# Patient Record
Sex: Female | Born: 1975 | Race: White | Hispanic: No | Smoking: Never smoker
Health system: Southern US, Community
[De-identification: ages and names within clinical notes are randomized; demographics above are authoritative.]

## PROBLEM LIST (undated history)

## (undated) DIAGNOSIS — E119 Type 2 diabetes mellitus without complications: Secondary | ICD-10-CM

## (undated) DIAGNOSIS — F429 Obsessive-compulsive disorder, unspecified: Secondary | ICD-10-CM

## (undated) DIAGNOSIS — Q8901 Asplenia (congenital): Secondary | ICD-10-CM

## (undated) DIAGNOSIS — F329 Major depressive disorder, single episode, unspecified: Secondary | ICD-10-CM

## (undated) DIAGNOSIS — G629 Polyneuropathy, unspecified: Secondary | ICD-10-CM

## (undated) DIAGNOSIS — I671 Cerebral aneurysm, nonruptured: Secondary | ICD-10-CM

## (undated) DIAGNOSIS — F32A Depression, unspecified: Secondary | ICD-10-CM

## (undated) DIAGNOSIS — F4320 Adjustment disorder, unspecified: Secondary | ICD-10-CM

## (undated) DIAGNOSIS — R7309 Other abnormal glucose: Secondary | ICD-10-CM

## (undated) DIAGNOSIS — E756 Lipid storage disorder, unspecified: Secondary | ICD-10-CM

## (undated) DIAGNOSIS — I10 Essential (primary) hypertension: Secondary | ICD-10-CM

## (undated) HISTORY — PX: SPLENECTOMY: SUR1306

---

## 2008-01-09 ENCOUNTER — Encounter (INDEPENDENT_AMBULATORY_CARE_PROVIDER_SITE_OTHER): Payer: Self-pay | Admitting: Oncology

## 2008-01-09 ENCOUNTER — Other Ambulatory Visit: Admission: RE | Admit: 2008-01-09 | Discharge: 2008-01-09 | Payer: Self-pay | Admitting: Oncology

## 2009-10-02 ENCOUNTER — Ambulatory Visit: Payer: Self-pay | Admitting: Diagnostic Radiology

## 2009-10-02 ENCOUNTER — Emergency Department (HOSPITAL_BASED_OUTPATIENT_CLINIC_OR_DEPARTMENT_OTHER): Admission: EM | Admit: 2009-10-02 | Discharge: 2009-10-02 | Payer: Self-pay | Admitting: Emergency Medicine

## 2010-03-22 ENCOUNTER — Encounter (INDEPENDENT_AMBULATORY_CARE_PROVIDER_SITE_OTHER): Payer: Self-pay | Admitting: *Deleted

## 2010-03-29 NOTE — Letter (Signed)
Summary: New Patient letter  [facility]  [address] [place], [place] [address]   Phone: [phone]  Fax: [phone]       03/22/2010 MRN: [id number]  Megan Weber [address] [place], [place]  [address]  Dear Megan Weber,  Welcome to the Gastroenterology Division at [facility].    You are scheduled to see Dr. Maxie Better on March 31, 2010 at 8:30am on the 3rd floor at [facility], [address]  We ask that you try to arrive at our office 15 minutes prior to your appointment time to allow for check-in.  We would like you to complete the enclosed self-administered evaluation form prior to your visit and bring it with you on the day of your appointment.  We will review it with you.  Also, please bring a complete list of all your medications or, if you prefer, bring the medication bottles and we will list them.  Please bring your insurance card so that we may make a copy of it.  If your insurance requires a referral to see a specialist, please bring your referral form from your primary care physician.  Co-payments are due at the time of your visit and may be paid by cash, check or credit card.     Your office visit will consist of a consult with your physician (includes a physical exam), any laboratory testing he/she may order, scheduling of any necessary diagnostic testing (e.g. x-ray, ultrasound, CT-scan), and scheduling of a procedure (e.g. Endoscopy, Colonoscopy) if required.  Please allow enough time on your schedule to allow for any/all of these possibilities.    If you cannot keep your appointment, please call [phone] to cancel or reschedule prior to your appointment date.  This allows [place] the opportunity to schedule an appointment for another patient in need of care.  If you do not cancel or reschedule by 5 p.m. the business day prior to your appointment date, you will be charged a $50.00 late cancellation/no-show fee.    Thank you for  choosing [facility] for your medical needs.  We appreciate the opportunity to care for you.  Please visit [place] at our website  to learn more about our practice.                     Sincerely,                                                             The Gastroenterology Division

## 2010-03-30 DIAGNOSIS — F410 Panic disorder [episodic paroxysmal anxiety] without agoraphobia: Secondary | ICD-10-CM | POA: Insufficient documentation

## 2010-03-30 DIAGNOSIS — F3289 Other specified depressive episodes: Secondary | ICD-10-CM | POA: Insufficient documentation

## 2010-03-30 DIAGNOSIS — R1012 Left upper quadrant pain: Secondary | ICD-10-CM | POA: Insufficient documentation

## 2010-03-30 DIAGNOSIS — D509 Iron deficiency anemia, unspecified: Secondary | ICD-10-CM | POA: Insufficient documentation

## 2010-03-30 DIAGNOSIS — I10 Essential (primary) hypertension: Secondary | ICD-10-CM | POA: Insufficient documentation

## 2010-03-30 DIAGNOSIS — Q8909 Congenital malformations of spleen: Secondary | ICD-10-CM | POA: Insufficient documentation

## 2010-03-30 DIAGNOSIS — D72829 Elevated white blood cell count, unspecified: Secondary | ICD-10-CM | POA: Insufficient documentation

## 2010-03-30 DIAGNOSIS — F429 Obsessive-compulsive disorder, unspecified: Secondary | ICD-10-CM | POA: Insufficient documentation

## 2010-03-30 DIAGNOSIS — G4700 Insomnia, unspecified: Secondary | ICD-10-CM | POA: Insufficient documentation

## 2010-03-30 DIAGNOSIS — G4489 Other headache syndrome: Secondary | ICD-10-CM | POA: Insufficient documentation

## 2010-03-30 DIAGNOSIS — F4320 Adjustment disorder, unspecified: Secondary | ICD-10-CM | POA: Insufficient documentation

## 2010-03-30 DIAGNOSIS — F329 Major depressive disorder, single episode, unspecified: Secondary | ICD-10-CM | POA: Insufficient documentation

## 2010-03-30 DIAGNOSIS — J069 Acute upper respiratory infection, unspecified: Secondary | ICD-10-CM | POA: Insufficient documentation

## 2010-03-31 ENCOUNTER — Ambulatory Visit: Payer: Self-pay | Admitting: Gastroenterology

## 2012-06-21 ENCOUNTER — Emergency Department (HOSPITAL_BASED_OUTPATIENT_CLINIC_OR_DEPARTMENT_OTHER): Payer: PRIVATE HEALTH INSURANCE

## 2012-06-21 ENCOUNTER — Emergency Department (HOSPITAL_BASED_OUTPATIENT_CLINIC_OR_DEPARTMENT_OTHER)
Admission: EM | Admit: 2012-06-21 | Discharge: 2012-06-21 | Disposition: A | Discharge status: Home / Self Care | Payer: PRIVATE HEALTH INSURANCE | Attending: Emergency Medicine | Admitting: Emergency Medicine

## 2012-06-21 ENCOUNTER — Encounter (HOSPITAL_BASED_OUTPATIENT_CLINIC_OR_DEPARTMENT_OTHER): Payer: Self-pay | Admitting: *Deleted

## 2012-06-21 DIAGNOSIS — I10 Essential (primary) hypertension: Secondary | ICD-10-CM | POA: Insufficient documentation

## 2012-06-21 DIAGNOSIS — R109 Unspecified abdominal pain: Secondary | ICD-10-CM

## 2012-06-21 DIAGNOSIS — D72829 Elevated white blood cell count, unspecified: Secondary | ICD-10-CM | POA: Insufficient documentation

## 2012-06-21 DIAGNOSIS — R1012 Left upper quadrant pain: Secondary | ICD-10-CM | POA: Insufficient documentation

## 2012-06-21 DIAGNOSIS — Z8669 Personal history of other diseases of the nervous system and sense organs: Secondary | ICD-10-CM | POA: Insufficient documentation

## 2012-06-21 DIAGNOSIS — Z9089 Acquired absence of other organs: Secondary | ICD-10-CM | POA: Insufficient documentation

## 2012-06-21 DIAGNOSIS — Z3202 Encounter for pregnancy test, result negative: Secondary | ICD-10-CM | POA: Insufficient documentation

## 2012-06-21 DIAGNOSIS — Z79899 Other long term (current) drug therapy: Secondary | ICD-10-CM | POA: Insufficient documentation

## 2012-06-21 DIAGNOSIS — Z7982 Long term (current) use of aspirin: Secondary | ICD-10-CM | POA: Insufficient documentation

## 2012-06-21 DIAGNOSIS — Z862 Personal history of diseases of the blood and blood-forming organs and certain disorders involving the immune mechanism: Secondary | ICD-10-CM | POA: Insufficient documentation

## 2012-06-21 DIAGNOSIS — Z87738 Personal history of other specified (corrected) congenital malformations of digestive system: Secondary | ICD-10-CM | POA: Insufficient documentation

## 2012-06-21 DIAGNOSIS — Z8639 Personal history of other endocrine, nutritional and metabolic disease: Secondary | ICD-10-CM | POA: Insufficient documentation

## 2012-06-21 DIAGNOSIS — F329 Major depressive disorder, single episode, unspecified: Secondary | ICD-10-CM | POA: Insufficient documentation

## 2012-06-21 DIAGNOSIS — G609 Hereditary and idiopathic neuropathy, unspecified: Secondary | ICD-10-CM | POA: Insufficient documentation

## 2012-06-21 DIAGNOSIS — Z8659 Personal history of other mental and behavioral disorders: Secondary | ICD-10-CM | POA: Insufficient documentation

## 2012-06-21 DIAGNOSIS — F3289 Other specified depressive episodes: Secondary | ICD-10-CM | POA: Insufficient documentation

## 2012-06-21 DIAGNOSIS — IMO0002 Reserved for concepts with insufficient information to code with codable children: Secondary | ICD-10-CM | POA: Insufficient documentation

## 2012-06-21 HISTORY — DX: Lipid storage disorder, unspecified: E75.6

## 2012-06-21 HISTORY — DX: Asplenia (congenital): Q89.01

## 2012-06-21 HISTORY — DX: Obsessive-compulsive disorder, unspecified: F42.9

## 2012-06-21 HISTORY — DX: Essential (primary) hypertension: I10

## 2012-06-21 HISTORY — DX: Polyneuropathy, unspecified: G62.9

## 2012-06-21 HISTORY — DX: Depression, unspecified: F32.A

## 2012-06-21 HISTORY — DX: Adjustment disorder, unspecified: F43.20

## 2012-06-21 HISTORY — DX: Other abnormal glucose: R73.09

## 2012-06-21 HISTORY — DX: Major depressive disorder, single episode, unspecified: F32.9

## 2012-06-21 LAB — URINALYSIS, ROUTINE W REFLEX MICROSCOPIC
Protein, ur: 100 mg/dL — AB
Specific Gravity, Urine: 1.023 (ref 1.005–1.030)
Urobilinogen, UA: 0.2 mg/dL (ref 0.0–1.0)

## 2012-06-21 LAB — COMPREHENSIVE METABOLIC PANEL
ALT: 25 U/L (ref 0–35)
AST: 22 U/L (ref 0–37)
Albumin: 3.8 g/dL (ref 3.5–5.2)
Alkaline Phosphatase: 90 U/L (ref 39–117)
BUN: 16 mg/dL (ref 6–23)
Chloride: 102 mEq/L (ref 96–112)
Potassium: 3.5 mEq/L (ref 3.5–5.1)
Sodium: 140 mEq/L (ref 135–145)
Total Bilirubin: 0.2 mg/dL — ABNORMAL LOW (ref 0.3–1.2)

## 2012-06-21 LAB — URINE MICROSCOPIC-ADD ON

## 2012-06-21 LAB — CBC
HCT: 43.7 % (ref 36.0–46.0)
Platelets: 474 10*3/uL — ABNORMAL HIGH (ref 150–400)
RDW: 14.9 % (ref 11.5–15.5)
WBC: 19.2 10*3/uL — ABNORMAL HIGH (ref 4.0–10.5)

## 2012-06-21 MED ORDER — SODIUM CHLORIDE 0.9 % IV BOLUS (SEPSIS)
1000.0000 mL | Freq: Once | INTRAVENOUS | Status: AC
  Administered 2012-06-21: 1000 mL via INTRAVENOUS

## 2012-06-21 MED ORDER — ONDANSETRON HCL 4 MG/2ML IJ SOLN
4.0000 mg | Freq: Once | INTRAMUSCULAR | Status: AC
  Administered 2012-06-21: 4 mg via INTRAVENOUS
  Filled 2012-06-21: qty 2

## 2012-06-21 MED ORDER — DEXTROSE 5 % IV SOLN
1.0000 g | Freq: Once | INTRAVENOUS | Status: AC
  Administered 2012-06-21: 1 g via INTRAVENOUS
  Filled 2012-06-21: qty 10

## 2012-06-21 MED ORDER — CEPHALEXIN 500 MG PO CAPS: 500.0000 mg | ORAL_CAPSULE | Freq: Four times a day (QID) | ORAL | Status: DC

## 2012-06-21 MED ORDER — IOHEXOL 300 MG/ML  SOLN
50.0000 mL | Freq: Once | INTRAMUSCULAR | Status: AC | PRN
  Administered 2012-06-21: 50 mL via ORAL

## 2012-06-21 MED ORDER — IOHEXOL 300 MG/ML  SOLN
100.0000 mL | Freq: Once | INTRAMUSCULAR | Status: AC | PRN
  Administered 2012-06-21: 100 mL via INTRAVENOUS

## 2012-06-21 MED ORDER — MORPHINE SULFATE 4 MG/ML IJ SOLN
4.0000 mg | Freq: Once | INTRAMUSCULAR | Status: AC
  Administered 2012-06-21: 4 mg via INTRAVENOUS
  Filled 2012-06-21: qty 1

## 2012-06-21 NOTE — ED Notes (Signed)
Patient transported to CT 

## 2012-06-21 NOTE — ED Notes (Addendum)
2nd liter IVF infused pt reports feeling much better tolerating po fluid well no further N/V noted

## 2012-06-21 NOTE — ED Notes (Signed)
Abdominal pain, fever, and no appetite x 5 days. She was sent here by her MD after being seen today.

## 2012-06-21 NOTE — ED Notes (Signed)
Pt reports "I have not eaten or drank anything at all since Sunday and I've had non-stop diarrhea since then." Saw PCP today. Per pt, she was sent here to have a CT.

## 2012-06-21 NOTE — ED Provider Notes (Signed)
History     CSN: [id number]  Arrival date & time 06/21/12  1633   First MD Initiated Contact with Patient 06/21/12 1649      Chief Complaint  Patient presents with  . Fever    (Consider location/radiation/quality/duration/timing/severity/associated sxs/prior treatment) HPI Pt presenting with c/o intermittent fever over the past 5 days, also 2 days of left upper abdominal pain.  Has had decreased appetite, no vomiting.  Has had frequent stools, but not diarrhea.  No blood in stool.  Pain is constant.  Pt was seen at her PMD office earlier in the week- treated with rocephin.  Seen again on recheck today and advised to come to the ED for further evaluation.  Denies dysuria, no flank pain.  Pt is currently having her menses.  There are no other associated systemic symptoms, there are no other alleviating or modifying factors.   Past Medical History  Diagnosis Date  . Adjustment disorder   . Asplenia   . Cerebral degeneration associated with generalized lipidosis   . Depression   . Abnormal glucose   . Hypertension   . OCD (obsessive compulsive disorder)   . Peripheral neuropathy     Past Surgical History  Procedure Laterality Date  . Splenectomy      History reviewed. No pertinent family history.  History  Substance Use Topics  . Smoking status: Never Smoker   . Smokeless tobacco: Not on file  . Alcohol Use: No    OB History   Grav Para Term Preterm Abortions TAB SAB Ect Mult Living                  Review of Systems ROS reviewed and all otherwise negative except for mentioned in HPI  Allergies  Ceftin; Cinobac; and Sulfa antibiotics  Home Medications   Current Outpatient Rx  Name  Route  Sig  Dispense  Refill  . aspirin 81 MG tablet   Oral   Take 81 mg by mouth daily.         . baclofen (LIORESAL) 20 MG tablet   Oral   Take 20 mg by mouth 2 (two) times daily.         . calcium carbonate (OS-CAL) 600 MG TABS   Oral   Take 600 mg by mouth 2 (two)  times daily with a meal.         . citalopram (CELEXA) 40 MG tablet   Oral   Take 40 mg by mouth daily.         . clonazePAM (KLONOPIN) 0.5 MG tablet   Oral   Take 0.5 mg by mouth 2 (two) times daily as needed for anxiety.         . cyanocobalamin 1000 MCG tablet   Subcutaneous   Inject 1,000 mcg into the skin every 30 (thirty) days.         . ferrous sulfate 325 (65 FE) MG tablet   Oral   Take 325 mg by mouth daily with breakfast.         . fish oil-omega-3 fatty acids 1000 MG capsule   Oral   Take 1 g by mouth daily.         . fluconazole (DIFLUCAN) 100 MG tablet   Oral   Take 100 mg by mouth once a week.         . fluticasone (FLONASE) 50 MCG/ACT nasal spray   Nasal   Place 2 sprays into the nose daily.         Marland Kitchen  folic acid (FOLVITE) 1 MG tablet   Oral   Take 1 mg by mouth daily.         . furosemide (LASIX) 20 MG tablet   Oral   Take 20 mg by mouth daily.         Marland Kitchen gabapentin (NEURONTIN) 300 MG capsule   Oral   Take 600 mg by mouth 4 (four) times daily.         Marland Kitchen LORazepam (ATIVAN) 0.5 MG tablet   Oral   Take 0.5 mg by mouth every 8 (eight) hours.         . modafinil (PROVIGIL) 100 MG tablet   Oral   Take 100 mg by mouth daily.         . niacin (NIASPAN) 500 MG CR tablet   Oral   Take 500 mg by mouth at bedtime.         Marland Kitchen oxyCODONE-acetaminophen (PERCOCET/ROXICET) 5-325 MG per tablet   Oral   Take 1 tablet by mouth every 4 (four) hours as needed for pain.         . promethazine (PHENERGAN) 25 MG tablet   Oral   Take 25 mg by mouth every 6 (six) hours as needed for nausea.         . propranolol (INDERAL) 60 MG tablet   Oral   Take 60 mg by mouth daily.         . vitamin C (ASCORBIC ACID) 500 MG tablet   Oral   Take 500 mg by mouth daily.         . cephALEXin (KEFLEX) 500 MG capsule   Oral   Take 1 capsule (500 mg total) by mouth 4 (four) times daily.   28 capsule   0     BP 114/66  Pulse 85   Temp(Src) 98.1 F (36.7 C) (Oral)  Resp 18  Wt 194 lb (87.998 kg)  SpO2 99%  LMP 06/21/2012 Vitals reviewed Physical Exam Physical Examination: General appearance - alert, well appearing, and in no distress Mental status - alert, oriented to person, place, and time Eyes - no scleral icterus, no conjunctival injection Mouth - mucous membranes moist, pharynx normal without lesions Chest - clear to auscultation, no wheezes, rales or rhonchi, symmetric air entry Heart - normal rate, regular rhythm, normal S1, S2, no murmurs, rubs, clicks or gallops Abdomen - soft, mild ttp in left upper abdomen, no gaurding or rebound tenderness, nondistended, no masses or organomegaly Extremities - peripheral pulses normal, no pedal edema, no clubbing or cyanosis Skin - normal coloration and turgor, no rashes  ED Course  Procedures (including critical care time)  Labs Reviewed  URINALYSIS, ROUTINE W REFLEX MICROSCOPIC - Abnormal; Notable for the following:    Color, Urine RED (*)    APPearance TURBID (*)    Hgb urine dipstick LARGE (*)    Bilirubin Urine LARGE (*)    Ketones, ur >80 (*)    Protein, ur 100 (*)    Leukocytes, UA MODERATE (*)    All other components within normal limits  URINE MICROSCOPIC-ADD ON - Abnormal; Notable for the following:    Squamous Epithelial / LPF FEW (*)    All other components within normal limits  CBC - Abnormal; Notable for the following:    WBC 19.2 (*)    RBC 5.20 (*)    Platelets 474 (*)    All other components within normal limits  COMPREHENSIVE METABOLIC PANEL - Abnormal; Notable for  the following:    Glucose, Bld 117 (*)    Total Bilirubin 0.2 (*)    All other components within normal limits  URINE CULTURE  PREGNANCY, URINE  LIPASE, BLOOD   Dg Chest 2 View  06/21/2012   *RADIOLOGY REPORT*  Clinical Data: Fever  CHEST - 2 VIEW  Comparison: 05/05/2009 [facility]  Findings: Left upper quadrant clips are in place from reported prior splenectomy.  Cardiomediastinal silhouette is within normal limits. The lungs are clear. No pleural effusion.  No pneumothorax. No acute osseous abnormality.  IMPRESSION: Normal chest.   Original Report Authenticated By: Christiana Pellant, M.D.   Ct Abdomen Pelvis W Contrast  06/21/2012   *RADIOLOGY REPORT*  Clinical Data: Left upper abdominal pain and fever for 5 days  CT ABDOMEN AND PELVIS WITH CONTRAST  Technique:  Multidetector CT imaging of the abdomen and pelvis was performed following the standard protocol during bolus administration of intravenous contrast.  Contrast: 50mL OMNIPAQUE IOHEXOL 300 MG/ML  SOLN, OMNIPAQUE IOHEXOL 300 MG/ML  SOLN  Comparison: 04/07/2009  Findings: Lung bases are clear.  Left upper quadrant clips compatible with previous splenectomy again noted. Abdominal viscera otherwise normal in appearance.  Uterus, ovaries, bladder, bowel, and appendix are normal.  No free air or fluid.  No osseous abnormality. Postsurgical change over the left flank subcutaneous tissues again noted, nearly resolved.  IMPRESSION: No acute intra-abdominal or pelvic pathology.  Status post splenectomy.   Original Report Authenticated By: Christiana Pellant, M.D.     1. Abdominal pain       MDM  Pt presenting with c/o fever, abdominal pain. Labs reveal leukocytosis of 19K which is trending down from 23K earlier this week (paperwork faxed from MDs office and reviewed by me).  Abdominal CT is reassuring.  Urinalysis shows RBCs c/w her current menses, but will send urine culture- pt started on cipro and RBCs may be masking UTI. Pt tolerating po fluids.  Discharged with strict return precautions.  Pt agreeable with plan.        Ethelda Chick, MD 06/22/12 1754

## 2012-12-05 ENCOUNTER — Other Ambulatory Visit: Payer: Self-pay

## 2017-01-30 ENCOUNTER — Emergency Department (HOSPITAL_BASED_OUTPATIENT_CLINIC_OR_DEPARTMENT_OTHER): Payer: Medicare HMO

## 2017-01-30 ENCOUNTER — Encounter (HOSPITAL_BASED_OUTPATIENT_CLINIC_OR_DEPARTMENT_OTHER): Payer: Self-pay | Admitting: *Deleted

## 2017-01-30 ENCOUNTER — Emergency Department (HOSPITAL_BASED_OUTPATIENT_CLINIC_OR_DEPARTMENT_OTHER)
Admission: EM | Admit: 2017-01-30 | Discharge: 2017-01-30 | Disposition: A | Discharge status: Home / Self Care | Payer: Medicare HMO | Attending: Emergency Medicine | Admitting: Emergency Medicine

## 2017-01-30 ENCOUNTER — Other Ambulatory Visit: Payer: Self-pay

## 2017-01-30 DIAGNOSIS — I10 Essential (primary) hypertension: Secondary | ICD-10-CM | POA: Insufficient documentation

## 2017-01-30 DIAGNOSIS — Z79899 Other long term (current) drug therapy: Secondary | ICD-10-CM | POA: Diagnosis not present

## 2017-01-30 DIAGNOSIS — R1030 Lower abdominal pain, unspecified: Secondary | ICD-10-CM

## 2017-01-30 DIAGNOSIS — E119 Type 2 diabetes mellitus without complications: Secondary | ICD-10-CM | POA: Diagnosis not present

## 2017-01-30 HISTORY — DX: Type 2 diabetes mellitus without complications: E11.9

## 2017-01-30 LAB — URINALYSIS, ROUTINE W REFLEX MICROSCOPIC
Bilirubin Urine: NEGATIVE
GLUCOSE, UA: NEGATIVE mg/dL
HGB URINE DIPSTICK: NEGATIVE
Ketones, ur: 15 mg/dL — AB
Nitrite: NEGATIVE
PH: 6 (ref 5.0–8.0)
Protein, ur: NEGATIVE mg/dL
SPECIFIC GRAVITY, URINE: 1.025 (ref 1.005–1.030)

## 2017-01-30 LAB — URINALYSIS, MICROSCOPIC (REFLEX): RBC / HPF: NONE SEEN RBC/hpf (ref 0–5)

## 2017-01-30 LAB — I-STAT CG4 LACTIC ACID, ED: LACTIC ACID, VENOUS: 2.13 mmol/L — AB (ref 0.5–1.9)

## 2017-01-30 LAB — COMPREHENSIVE METABOLIC PANEL
ALK PHOS: 104 U/L (ref 38–126)
ALT: 10 U/L — ABNORMAL LOW (ref 14–54)
AST: 20 U/L (ref 15–41)
Albumin: 3.6 g/dL (ref 3.5–5.0)
Anion gap: 9 (ref 5–15)
BUN: 9 mg/dL (ref 6–20)
CALCIUM: 8.9 mg/dL (ref 8.9–10.3)
CO2: 21 mmol/L — ABNORMAL LOW (ref 22–32)
Chloride: 109 mmol/L (ref 101–111)
Creatinine, Ser: 0.49 mg/dL (ref 0.44–1.00)
Glucose, Bld: 164 mg/dL — ABNORMAL HIGH (ref 65–99)
Potassium: 3.5 mmol/L (ref 3.5–5.1)
Sodium: 139 mmol/L (ref 135–145)
Total Bilirubin: 0.4 mg/dL (ref 0.3–1.2)
Total Protein: 6.7 g/dL (ref 6.5–8.1)

## 2017-01-30 LAB — CBC WITH DIFFERENTIAL/PLATELET
BASOS ABS: 0 10*3/uL (ref 0.0–0.1)
Basophils Relative: 0 %
EOS PCT: 7 %
Eosinophils Absolute: 1.5 10*3/uL — ABNORMAL HIGH (ref 0.0–0.7)
HCT: 37.9 % (ref 36.0–46.0)
Hemoglobin: 12.5 g/dL (ref 12.0–15.0)
LYMPHS ABS: 7.6 10*3/uL — AB (ref 0.7–4.0)
Lymphocytes Relative: 35 %
MCH: 28.9 pg (ref 26.0–34.0)
MCHC: 33 g/dL (ref 30.0–36.0)
MCV: 87.7 fL (ref 78.0–100.0)
MONO ABS: 1.1 10*3/uL — AB (ref 0.1–1.0)
Monocytes Relative: 5 %
NEUTROS ABS: 11.4 10*3/uL — AB (ref 1.7–7.7)
Neutrophils Relative %: 53 %
PLATELETS: 448 10*3/uL — AB (ref 150–400)
RBC: 4.32 MIL/uL (ref 3.87–5.11)
RDW: 14.4 % (ref 11.5–15.5)
WBC: 21.6 10*3/uL — AB (ref 4.0–10.5)

## 2017-01-30 LAB — PREGNANCY, URINE: Preg Test, Ur: NEGATIVE

## 2017-01-30 LAB — LIPASE, BLOOD: LIPASE: 31 U/L (ref 11–51)

## 2017-01-30 MED ORDER — FENTANYL CITRATE (PF) 100 MCG/2ML IJ SOLN
50.0000 ug | Freq: Once | INTRAMUSCULAR | Status: AC
  Administered 2017-01-30: 50 ug via INTRAVENOUS
  Filled 2017-01-30: qty 2

## 2017-01-30 MED ORDER — SODIUM CHLORIDE 0.9 % IV BOLUS (SEPSIS)
1000.0000 mL | Freq: Once | INTRAVENOUS | Status: AC
  Administered 2017-01-30: 1000 mL via INTRAVENOUS

## 2017-01-30 MED ORDER — IOPAMIDOL (ISOVUE-300) INJECTION 61%
100.0000 mL | Freq: Once | INTRAVENOUS | Status: AC | PRN
  Administered 2017-01-30: 100 mL via INTRAVENOUS

## 2017-01-30 NOTE — Discharge Instructions (Signed)
There does not appear to be an emergent cause for your abdominal pain at this time.  Your labs were overall reassuring as was your CT scan of your abdomen.  You have elected not to have a pelvic exam.  If you begin to have worsening pain, experience fevers, chills, vaginal bleeding, discharge, unusual back pain, please return for reevaluation.  You may otherwise follow-up with your doctor.  Continue supportive care at home with OTC medications.

## 2017-01-30 NOTE — ED Notes (Signed)
States she cant put the gown on for exam because it makes her anxious.

## 2017-01-30 NOTE — ED Provider Notes (Signed)
[facility] EMERGENCY DEPARTMENT Provider Note   CSN: [id number] Arrival date & time: 01/30/17  1201     History   Chief Complaint Chief Complaint  Patient presents with  . Abdominal Pain    HPI Megan Weber is a 42 y.o. female.  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  Patient reports symptoms started yesterday.  She characterizes it as a sharp stabbing sensation in her bilateral lower abdomen.  She reports diarrhea is normal for her and will often go 10 times daily.  Some mild nausea.  She denies any fevers, chills, chest pain, shortness of breath, vomiting, urinary symptoms, unusual diarrhea or constipation, dark or bloody stool.  No unusual foods, medications or sick contacts.  Has been taking ibuprofen and Tylenol with minimal relief. Past Medical History:  Diagnosis Date  . Abnormal glucose   . Adjustment disorder   . Asplenia   . Cerebral degeneration associated with generalized lipidosis   . Depression   . Diabetes mellitus without complication (HCC)   . Hypertension   . OCD (obsessive compulsive disorder)   . Peripheral neuropathy     Patient Active Problem List   Diagnosis Date Noted  . ANEMIA, IRON DEFICIENCY 03/30/2010  . LEUKOCYTOSIS 03/30/2010  . PANIC ATTACK 03/30/2010  . OBSESSION 03/30/2010  . ADJUSTMENT DISORDER 03/30/2010  . DEPRESSION 03/30/2010  . OTHER SPECIFIED HEADACHE SYNDROMES 03/30/2010  . HYPERTENSION 03/30/2010  . URI 03/30/2010  . ASPLENIA 03/30/2010  . INSOMNIA 03/30/2010  . LUQ PAIN 03/30/2010    Past Surgical History:  Procedure Laterality Date  . SPLENECTOMY      OB History    No data available       Home Medications    Prior to Admission medications   Medication Sig Start Date End Date Taking? Authorizing Provider  calcium carbonate (OS-CAL) 600 MG TABS Take 600 mg by mouth 2 (two) times daily with a meal.   Yes [provider]    citalopram (CELEXA) 40 MG tablet Take 40 mg by mouth daily.   Yes [provider]  clonazePAM (KLONOPIN) 0.5 MG tablet Take 0.5 mg by mouth 2 (two) times daily as needed for anxiety.   Yes [provider]  ferrous sulfate 325 (65 FE) MG tablet Take 325 mg by mouth daily with breakfast.   Yes [provider]  fluconazole (DIFLUCAN) 100 MG tablet Take 100 mg by mouth once a week.   Yes [provider]  folic acid (FOLVITE) 1 MG tablet Take 1 mg by mouth daily.   Yes [provider]  gabapentin (NEURONTIN) 300 MG capsule Take 600 mg by mouth 4 (four) times daily.   Yes [provider]  LISINOPRIL PO Take by mouth.   Yes [provider]  METFORMIN HCL PO Take by mouth.   Yes [provider]  METOPROLOL SUCCINATE PO Take by mouth.   Yes [provider]  TiZANidine HCl (ZANAFLEX PO) Take by mouth.   Yes [provider]  vitamin C (ASCORBIC ACID) 500 MG tablet Take 500 mg by mouth daily.   Yes [provider]  aspirin 81 MG tablet Take 81 mg by mouth daily.    [provider]  baclofen (LIORESAL) 20 MG tablet Take 20 mg by mouth 2 (two) times daily.    [provider]  cephALEXin (KEFLEX) 500 MG capsule Take 1 capsule (500 mg total) by mouth 4 (four) times daily. 06/21/12  Mabe, Latanya MaudlinMartha L, MD  cyanocobalamin 1000 MCG tablet Inject 1,000 mcg into the skin every 30 (thirty) days.    [provider]  fish oil-omega-3 fatty acids 1000 MG capsule Take 1 g by mouth daily.    [provider]  fluticasone (FLONASE) 50 MCG/ACT nasal spray Place 2 sprays into the nose daily.    [provider]  furosemide (LASIX) 20 MG tablet Take 20 mg by mouth daily.    [provider]  LORazepam (ATIVAN) 0.5 MG tablet Take 0.5 mg by mouth every 8 (eight) hours.    [provider]  modafinil (PROVIGIL) 100 MG tablet Take 100 mg by mouth daily.    [provider]  niacin (NIASPAN) 500 MG CR tablet Take 500 mg by mouth at bedtime.    [provider]  oxyCODONE-acetaminophen (PERCOCET/ROXICET) 5-325 MG per tablet Take 1 tablet by mouth every 4 (four) hours as needed for pain.    [provider]  promethazine (PHENERGAN) 25 MG tablet Take 25 mg by mouth every 6 (six) hours as needed for nausea.    [provider]  propranolol (INDERAL) 60 MG tablet Take 60 mg by mouth daily.    [provider]    Family History No family history on file.  Social History Social History   Tobacco Use  . Smoking status: Never Smoker  . Smokeless tobacco: Never Used  Substance Use Topics  . Alcohol use: No  . Drug use: No     Allergies   Ceftin [cefuroxime axetil]; Cinobac [cinoxacin]; and Sulfa antibiotics   Review of Systems Review of Systems See HPI  Physical Exam Updated Vital Signs BP 114/60   Pulse 84   Temp 98.4 F (36.9 C) (Oral)   Resp 18   Ht 5' (1.524 m)   Wt 83.9 kg (185 lb)   SpO2 99%   BMI 36.13 kg/m   Physical Exam  Constitutional: She appears well-developed. No distress.  Awake, alert and nontoxic in appearance  HENT:  Head: Normocephalic and atraumatic.  Right Ear: External ear normal.  Left Ear: External ear normal.  Mouth/Throat: Oropharynx is clear and moist.  Eyes: Conjunctivae and EOM are normal. Pupils are equal, round, and reactive to light.  Neck: Normal range of motion. No JVD present.  Cardiovascular: Normal rate, regular rhythm and normal heart sounds.  Pulmonary/Chest: Effort normal and breath sounds normal. No stridor.  Abdominal: Soft. She exhibits no distension and no mass. There is no rebound and no guarding. No hernia.  Abdomen is soft without distention, rebound or guarding.  Diffuse tenderness with deep palpation, worse in bilateral lower abdomen and suprapubic region.  No skin abnormalities.  Musculoskeletal: Normal range of motion.  Neurological:  Awake, alert,  cooperative and aware of situation; motor strength bilaterally; sensation normal to light touch bilaterally; no facial asymmetry; tongue midline; major cranial nerves appear intact;  baseline gait without new ataxia.  Skin: No rash noted. She is not diaphoretic.  Psychiatric: She has a normal mood and affect. Her behavior is normal. Thought content normal.  Nursing note and vitals reviewed.  Vitals:   01/30/17 1431 01/30/17 1512  BP: 121/78 114/60  Pulse: 69 84  Resp: 18   Temp:    SpO2: 100% 99%     ED Treatments / Results  Labs (all labs ordered are listed, but only abnormal results are displayed) Labs Reviewed  URINALYSIS, ROUTINE W REFLEX MICROSCOPIC - Abnormal; Notable for the following components:  Result Value   Ketones, ur 15 (*)    Leukocytes, UA TRACE (*)    All other components within normal limits  COMPREHENSIVE METABOLIC PANEL - Abnormal; Notable for the following components:   CO2 21 (*)    Glucose, Bld 164 (*)    ALT 10 (*)    All other components within normal limits  CBC WITH DIFFERENTIAL/PLATELET - Abnormal; Notable for the following components:   WBC 21.6 (*)    Platelets 448 (*)    Neutro Abs 11.4 (*)    Lymphs Abs 7.6 (*)    Monocytes Absolute 1.1 (*)    Eosinophils Absolute 1.5 (*)    All other components within normal limits  URINALYSIS, MICROSCOPIC (REFLEX) - Abnormal; Notable for the following components:   Bacteria, UA RARE (*)    Squamous Epithelial / LPF 6-30 (*)    All other components within normal limits  I-STAT CG4 LACTIC ACID, ED - Abnormal; Notable for the following components:   Lactic Acid, Venous 2.13 (*)    All other components within normal limits  LIPASE, BLOOD  PREGNANCY, URINE  LACTIC ACID, PLASMA  LACTIC ACID, PLASMA    EKG  EKG Interpretation None       Radiology Ct Abdomen Pelvis W Contrast  Result Date: 01/30/2017 CLINICAL DATA:  Abdominal pain since yesterday. EXAM: CT ABDOMEN AND PELVIS WITH CONTRAST  TECHNIQUE: Multidetector CT imaging of the abdomen and pelvis was performed using the standard protocol following bolus administration of intravenous contrast. CONTRAST:  ISOVUE-300 IOPAMIDOL (ISOVUE-300) INJECTION 61% COMPARISON:  09/23/2014 FINDINGS: Lower chest:  Unremarkable. Hepatobiliary: No focal abnormality within the liver parenchyma. Gallbladder decompressed. No intrahepatic or extrahepatic biliary dilation. Pancreas: No focal mass lesion. No dilatation of the main duct. No intraparenchymal cyst. No peripancreatic edema. Spleen: Surgically absent. Adrenals/Urinary Tract: No adrenal nodule or mass. Kidneys unremarkable. No evidence for hydroureter. The urinary bladder appears normal for the degree of distention. Stomach/Bowel: Stomach is nondistended. No gastric wall thickening. No evidence of outlet obstruction. Duodenum is normally positioned as is the ligament of Treitz. No small bowel wall thickening. No small bowel dilatation. The terminal ileum is normal. The appendix is normal. No gross colonic mass. No colonic wall thickening. No substantial diverticular change. Vascular/Lymphatic: No abdominal aortic aneurysm. No abdominal aortic atherosclerotic calcification. Portal vein and superior mesenteric vein are patent. There is no gastrohepatic or hepatoduodenal ligament lymphadenopathy. No intraperitoneal or retroperitoneal lymphadenopathy. No pelvic sidewall lymphadenopathy. Reproductive: The uterus has normal CT imaging appearance. There is no adnexal mass. Other: No intraperitoneal free fluid. Musculoskeletal: Bone windows reveal no worrisome lytic or sclerotic osseous lesions. IMPRESSION: 1. No acute findings in the abdomen or pelvis. No findings to explain the patient's history of pain. Electronically Signed   By: Kennith Center M.D.   On: 01/30/2017 15:05    Procedures Procedures (including critical care time)  Medications Ordered in ED Medications  sodium chloride 0.9 % bolus 1,000 mL  (0 mLs Intravenous Stopped 01/30/17 1505)  iopamidol (ISOVUE-300) 61 % injection 100 mL (100 mLs Intravenous Contrast Given 01/30/17 1449)  fentaNYL (SUBLIMAZE) injection 50 mcg (50 mcg Intravenous Given 01/30/17 1507)     Initial Impression / Assessment and Plan / ED Course  I have reviewed the triage vital signs and the nursing notes.  Pertinent labs & imaging results that were available during my care of the patient were reviewed by me and considered in my medical decision making (see chart for details).  The patient reports she feels much better since being in emergency department.  She is hemodynamically stable, afebrile.  She does have mild diffuse abdominal tenderness with palpation but no peritoneal signs.  Labs do show elevated white count of 21.6 with left shift, she reports this is normal for her.  Lactic acid 2.13.  Given liter of saline.  CT abdomen shows no acute intra-abdominal pathology.  Pregnancy is negative and urinalysis is without evidence of overt infection.  She declines pelvic exam and understands the risks associated with this decision.  Reports that she feels much better and is ready to go home.  Will follow up with her PCP.  Strict return precautions discussed.  Knows that she can return at any time for reevaluation. Prior to patient discharge, I discussed and reviewed this case with Dr. Anitra Lauth     Final Clinical Impressions(s) / ED Diagnoses   Final diagnoses:  Lower abdominal pain    ED Discharge Orders    None       Arlyss Gandy 01/30/17 1611    Joycie Peek, PA-C 01/30/17 1640    Gwyneth Sprout, MD 01/31/17 2102

## 2017-01-30 NOTE — ED Triage Notes (Signed)
Abdominal pain since yesterday. Diarrhea.

## 2017-03-19 ENCOUNTER — Encounter (HOSPITAL_BASED_OUTPATIENT_CLINIC_OR_DEPARTMENT_OTHER): Payer: Self-pay

## 2017-03-19 ENCOUNTER — Other Ambulatory Visit: Payer: Self-pay

## 2017-03-19 ENCOUNTER — Emergency Department (HOSPITAL_BASED_OUTPATIENT_CLINIC_OR_DEPARTMENT_OTHER)
Admission: EM | Admit: 2017-03-19 | Discharge: 2017-03-19 | Disposition: A | Discharge status: Home / Self Care | Payer: Medicare HMO | Attending: Emergency Medicine | Admitting: Emergency Medicine

## 2017-03-19 DIAGNOSIS — Z5321 Procedure and treatment not carried out due to patient leaving prior to being seen by health care provider: Secondary | ICD-10-CM | POA: Diagnosis not present

## 2017-03-19 DIAGNOSIS — T7840XA Allergy, unspecified, initial encounter: Secondary | ICD-10-CM | POA: Diagnosis present

## 2017-03-19 MED ORDER — FAMOTIDINE 20 MG PO TABS
20.0000 mg | ORAL_TABLET | Freq: Once | ORAL | Status: AC
  Administered 2017-03-19: 20 mg via ORAL
  Filled 2017-03-19: qty 1

## 2017-03-19 MED ORDER — ONDANSETRON 4 MG PO TBDP
4.0000 mg | ORAL_TABLET | Freq: Once | ORAL | Status: AC
  Administered 2017-03-19: 4 mg via ORAL
  Filled 2017-03-19: qty 1

## 2017-03-19 NOTE — ED Triage Notes (Addendum)
Pt c/o "allergic reaction" x 30 min-with redness to face and abd pain-states she had the same 2 weeks ago-saw allergist last week-pt no resp distress-throat and back of throat WNL-pt is anxious-shaking-no resp distress-sates she took benadryl x 3 PTA

## 2017-03-19 NOTE — ED Notes (Signed)
Pt informed registration she was leaving  

## 2017-04-09 ENCOUNTER — Encounter (HOSPITAL_BASED_OUTPATIENT_CLINIC_OR_DEPARTMENT_OTHER): Payer: Self-pay | Admitting: *Deleted

## 2017-04-09 ENCOUNTER — Emergency Department (HOSPITAL_BASED_OUTPATIENT_CLINIC_OR_DEPARTMENT_OTHER): Payer: Medicare HMO

## 2017-04-09 ENCOUNTER — Other Ambulatory Visit: Payer: Self-pay

## 2017-04-09 ENCOUNTER — Observation Stay (HOSPITAL_BASED_OUTPATIENT_CLINIC_OR_DEPARTMENT_OTHER)
Admission: EM | Admit: 2017-04-09 | Discharge: 2017-04-10 | Disposition: A | Discharge status: Home / Self Care | Payer: Medicare HMO | Attending: Internal Medicine | Admitting: Internal Medicine

## 2017-04-09 DIAGNOSIS — I10 Essential (primary) hypertension: Secondary | ICD-10-CM | POA: Diagnosis present

## 2017-04-09 DIAGNOSIS — A419 Sepsis, unspecified organism: Secondary | ICD-10-CM | POA: Diagnosis not present

## 2017-04-09 DIAGNOSIS — E75249 Niemann-Pick disease, unspecified: Secondary | ICD-10-CM | POA: Diagnosis not present

## 2017-04-09 DIAGNOSIS — F429 Obsessive-compulsive disorder, unspecified: Secondary | ICD-10-CM | POA: Diagnosis not present

## 2017-04-09 DIAGNOSIS — F4322 Adjustment disorder with anxiety: Secondary | ICD-10-CM | POA: Diagnosis not present

## 2017-04-09 DIAGNOSIS — Z833 Family history of diabetes mellitus: Secondary | ICD-10-CM | POA: Insufficient documentation

## 2017-04-09 DIAGNOSIS — E669 Obesity, unspecified: Secondary | ICD-10-CM | POA: Diagnosis present

## 2017-04-09 DIAGNOSIS — F329 Major depressive disorder, single episode, unspecified: Secondary | ICD-10-CM | POA: Diagnosis not present

## 2017-04-09 DIAGNOSIS — Z888 Allergy status to other drugs, medicaments and biological substances status: Secondary | ICD-10-CM | POA: Insufficient documentation

## 2017-04-09 DIAGNOSIS — R7989 Other specified abnormal findings of blood chemistry: Secondary | ICD-10-CM | POA: Diagnosis not present

## 2017-04-09 DIAGNOSIS — Z7984 Long term (current) use of oral hypoglycemic drugs: Secondary | ICD-10-CM | POA: Diagnosis not present

## 2017-04-09 DIAGNOSIS — Z881 Allergy status to other antibiotic agents status: Secondary | ICD-10-CM | POA: Diagnosis not present

## 2017-04-09 DIAGNOSIS — E1140 Type 2 diabetes mellitus with diabetic neuropathy, unspecified: Secondary | ICD-10-CM | POA: Diagnosis not present

## 2017-04-09 DIAGNOSIS — R933 Abnormal findings on diagnostic imaging of other parts of digestive tract: Secondary | ICD-10-CM

## 2017-04-09 DIAGNOSIS — F0280 Dementia in other diseases classified elsewhere without behavioral disturbance: Secondary | ICD-10-CM | POA: Diagnosis not present

## 2017-04-09 DIAGNOSIS — Z7982 Long term (current) use of aspirin: Secondary | ICD-10-CM | POA: Diagnosis not present

## 2017-04-09 DIAGNOSIS — D509 Iron deficiency anemia, unspecified: Secondary | ICD-10-CM | POA: Diagnosis not present

## 2017-04-09 DIAGNOSIS — G319 Degenerative disease of nervous system, unspecified: Secondary | ICD-10-CM | POA: Insufficient documentation

## 2017-04-09 DIAGNOSIS — J189 Pneumonia, unspecified organism: Secondary | ICD-10-CM | POA: Diagnosis present

## 2017-04-09 DIAGNOSIS — Q8909 Congenital malformations of spleen: Secondary | ICD-10-CM

## 2017-04-09 DIAGNOSIS — J181 Lobar pneumonia, unspecified organism: Secondary | ICD-10-CM | POA: Diagnosis present

## 2017-04-09 DIAGNOSIS — Z79899 Other long term (current) drug therapy: Secondary | ICD-10-CM | POA: Diagnosis not present

## 2017-04-09 DIAGNOSIS — E1169 Type 2 diabetes mellitus with other specified complication: Secondary | ICD-10-CM | POA: Diagnosis present

## 2017-04-09 DIAGNOSIS — Z882 Allergy status to sulfonamides status: Secondary | ICD-10-CM | POA: Insufficient documentation

## 2017-04-09 DIAGNOSIS — Z9081 Acquired absence of spleen: Secondary | ICD-10-CM | POA: Diagnosis not present

## 2017-04-09 LAB — CBC WITH DIFFERENTIAL/PLATELET
BASOS ABS: 0 10*3/uL (ref 0.0–0.1)
Basophils Relative: 0 %
EOS ABS: 0.6 10*3/uL (ref 0.0–0.7)
Eosinophils Relative: 2 %
HEMATOCRIT: 39.5 % (ref 36.0–46.0)
Hemoglobin: 12.9 g/dL (ref 12.0–15.0)
LYMPHS ABS: 8 10*3/uL — AB (ref 0.7–4.0)
LYMPHS PCT: 29 %
MCH: 28.6 pg (ref 26.0–34.0)
MCHC: 32.7 g/dL (ref 30.0–36.0)
MCV: 87.6 fL (ref 78.0–100.0)
MONOS PCT: 4 %
Monocytes Absolute: 1.1 10*3/uL — ABNORMAL HIGH (ref 0.1–1.0)
Neutro Abs: 18 10*3/uL — ABNORMAL HIGH (ref 1.7–7.7)
Neutrophils Relative %: 65 %
Platelets: 211 10*3/uL (ref 150–400)
RBC: 4.51 MIL/uL (ref 3.87–5.11)
RDW: 14 % (ref 11.5–15.5)
WBC: 27.7 10*3/uL — AB (ref 4.0–10.5)

## 2017-04-09 LAB — I-STAT CG4 LACTIC ACID, ED
LACTIC ACID, VENOUS: 3.34 mmol/L — AB (ref 0.5–1.9)
Lactic Acid, Venous: 2.85 mmol/L (ref 0.5–1.9)

## 2017-04-09 LAB — INFLUENZA PANEL BY PCR (TYPE A & B)
Influenza A By PCR: NEGATIVE
Influenza B By PCR: NEGATIVE

## 2017-04-09 LAB — COMPREHENSIVE METABOLIC PANEL
ALT: 60 U/L — ABNORMAL HIGH (ref 14–54)
ANION GAP: 11 (ref 5–15)
AST: 57 U/L — ABNORMAL HIGH (ref 15–41)
Albumin: 3.4 g/dL — ABNORMAL LOW (ref 3.5–5.0)
Alkaline Phosphatase: 103 U/L (ref 38–126)
BILIRUBIN TOTAL: 0.3 mg/dL (ref 0.3–1.2)
BUN: 8 mg/dL (ref 6–20)
CALCIUM: 8.4 mg/dL — AB (ref 8.9–10.3)
CO2: 17 mmol/L — ABNORMAL LOW (ref 22–32)
Chloride: 107 mmol/L (ref 101–111)
Creatinine, Ser: 0.65 mg/dL (ref 0.44–1.00)
GFR calc Af Amer: >60 mL/min (ref >60)
Glucose, Bld: 135 mg/dL — ABNORMAL HIGH (ref 65–99)
Potassium: 4 mmol/L (ref 3.5–5.1)
Sodium: 135 mmol/L (ref 135–145)
TOTAL PROTEIN: 6.2 g/dL — AB (ref 6.5–8.1)

## 2017-04-09 LAB — URINALYSIS, ROUTINE W REFLEX MICROSCOPIC
BILIRUBIN URINE: NEGATIVE
GLUCOSE, UA: NEGATIVE mg/dL
Hgb urine dipstick: NEGATIVE
Ketones, ur: 15 mg/dL — AB
Nitrite: NEGATIVE
PH: 5.5 (ref 5.0–8.0)
Protein, ur: NEGATIVE mg/dL
Specific Gravity, Urine: >1.03 — ABNORMAL HIGH (ref 1.005–1.030)

## 2017-04-09 LAB — LIPASE, BLOOD: LIPASE: 18 U/L (ref 11–51)

## 2017-04-09 LAB — URINALYSIS, MICROSCOPIC (REFLEX)

## 2017-04-09 LAB — PREGNANCY, URINE: Preg Test, Ur: NEGATIVE

## 2017-04-09 MED ORDER — AZTREONAM 2 G IJ SOLR
2.0000 g | Freq: Once | INTRAMUSCULAR | Status: AC
  Administered 2017-04-10: 2 g via INTRAVENOUS
  Filled 2017-04-09: qty 2

## 2017-04-09 MED ORDER — IBUPROFEN 400 MG PO TABS
400.0000 mg | ORAL_TABLET | Freq: Once | ORAL | Status: AC
  Administered 2017-04-09: 400 mg via ORAL
  Filled 2017-04-09: qty 1

## 2017-04-09 MED ORDER — CLONAZEPAM 0.5 MG PO TABS
0.5000 mg | ORAL_TABLET | Freq: Once | ORAL | Status: DC
  Filled 2017-04-09: qty 1

## 2017-04-09 MED ORDER — MORPHINE SULFATE (PF) 4 MG/ML IV SOLN
4.0000 mg | Freq: Once | INTRAVENOUS | Status: AC
  Administered 2017-04-09: 4 mg via INTRAVENOUS
  Filled 2017-04-09: qty 1

## 2017-04-09 MED ORDER — ONDANSETRON HCL 4 MG/2ML IJ SOLN
4.0000 mg | Freq: Once | INTRAMUSCULAR | Status: AC
  Administered 2017-04-09: 4 mg via INTRAVENOUS
  Filled 2017-04-09: qty 2

## 2017-04-09 MED ORDER — VANCOMYCIN HCL IN DEXTROSE 1-5 GM/200ML-% IV SOLN
INTRAVENOUS | Status: AC
  Filled 2017-04-09: qty 200

## 2017-04-09 MED ORDER — VANCOMYCIN HCL 10 G IV SOLR
2000.0000 mg | Freq: Once | INTRAVENOUS | Status: AC
  Administered 2017-04-09: 2000 mg via INTRAVENOUS
  Filled 2017-04-09: qty 2000

## 2017-04-09 MED ORDER — SODIUM CHLORIDE 0.9 % IV BOLUS (SEPSIS)
1000.0000 mL | Freq: Once | INTRAVENOUS | Status: AC
  Administered 2017-04-09: 1000 mL via INTRAVENOUS

## 2017-04-09 MED ORDER — LORAZEPAM 1 MG PO TABS
0.5000 mg | ORAL_TABLET | Freq: Once | ORAL | Status: AC
  Administered 2017-04-09: 0.5 mg via ORAL
  Filled 2017-04-09: qty 1

## 2017-04-09 MED ORDER — IPRATROPIUM-ALBUTEROL 0.5-2.5 (3) MG/3ML IN SOLN
3.0000 mL | Freq: Once | RESPIRATORY_TRACT | Status: AC
  Administered 2017-04-09: 3 mL via RESPIRATORY_TRACT
  Filled 2017-04-09: qty 3

## 2017-04-09 MED ORDER — METRONIDAZOLE IN NACL 5-0.79 MG/ML-% IV SOLN
500.0000 mg | Freq: Once | INTRAVENOUS | Status: AC
  Administered 2017-04-09: 500 mg via INTRAVENOUS
  Filled 2017-04-09: qty 100

## 2017-04-09 MED ORDER — METRONIDAZOLE IN NACL 5-0.79 MG/ML-% IV SOLN: 500.0000 mg | Freq: Three times a day (TID) | INTRAVENOUS | Status: DC

## 2017-04-09 MED ORDER — VANCOMYCIN HCL IN DEXTROSE 1-5 GM/200ML-% IV SOLN: 1000.0000 mg | Freq: Three times a day (TID) | INTRAVENOUS | Status: DC

## 2017-04-09 MED ORDER — SODIUM CHLORIDE 0.9 % IV SOLN
2.0000 g | Freq: Three times a day (TID) | INTRAVENOUS | Status: DC
  Filled 2017-04-09: qty 2

## 2017-04-09 NOTE — ED Notes (Signed)
Informed Diane Lorn JunesMerritt RN of Code Sepsis

## 2017-04-09 NOTE — ED Triage Notes (Signed)
Fever and pain in her left mid abdomin 4 days. Her MD started her an antibiotics for a UTI. She now has thrush. [facility] for frequent UTI's. She has had her spleen removed in the past. Fever [facility].

## 2017-04-09 NOTE — Progress Notes (Signed)
Pharmacy Antibiotic Note  Megan Weber is a 42 y.o. female admitted on 04/09/2017 with sepsis.  Patient has pneumonia with possible intra-abdominal complications and allergic reactions to multiple antibiotics which includes severe shortness of breath.  Pharmacy has been consulted for vancomycin, aztreonam and metronidazole dosing.  Plan:  - vancomycin 2 G load x 1 ( run two 1 G bags one after the other) - vancomycin 1 G every 8 hours - goal trough 15-20 mcg/mL  - aztreonam 2 G x 1 - aztreonam 2 G every 8 hours  - metronidazole 500 mg x 1 - metronidazole 500 mg every 8 hours  - monitor clinical progression, length of therapy, renal function, and vancomycin trough as needed    Temp (24hrs), Avg:100.2 F (37.9 C), Min:99.3 F (37.4 C), Max:101 F (38.3 C)  Recent Labs  Lab 04/09/17 1530 04/09/17 1702  WBC 27.7*  --   CREATININE 0.65  --   LATICACIDVEN  --  3.34*    CrCl cannot be calculated (Unknown ideal weight.).    Allergies  Allergen Reactions  . Ceftin [Cefuroxime Axetil]   . Cinobac [Cinoxacin]   . Sulfa Antibiotics     Antimicrobials this admission: 3/11 vancomycin >>  3/11 aztreonam >>  3/11 metronidazole >>  Dose adjustments this admission: N/A  Microbiology results: 3/11 BCx: pending 3/11 UCx: pending   Thank you for allowing pharmacy to be a part of this patient's care.  Harlow AsaAmber C Ashten Sarnowski, PharmD 04/09/2017 5:14 PM

## 2017-04-09 NOTE — ED Provider Notes (Signed)
[facility] EMERGENCY DEPARTMENT Provider Note   CSN: [id number] Arrival date & time: 04/09/17  1345     History   Chief Complaint Chief Complaint  Patient presents with  . Fever  . Abdominal Pain    HPI Megan Weber is a 42 y.o. 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  She was seen today by her urologist office after she self referred due to repeat UTIs.  They found her to be for her and sent her here for evaluation.  She is not having any urinary type symptoms, no increased frequency, urgency, dysuria.  She denies cough or sore throat.  She reports diffuse pain body wide stating that it feels like someone took pool balls and use them to beat her from her shoulders down.   She reports that her abdomen is not really bothering her, rather that her back, arms, and legs are all. She had a CT scan performed on 03/06/17 which did not show any abnormalities.    HPI  Past Medical History:  Diagnosis Date  . Abnormal glucose   . Adjustment disorder   . Asplenia   . Cerebral degeneration associated with generalized lipidosis   . Depression   . Diabetes mellitus without complication (HCC)   . Hypertension   . OCD (obsessive compulsive disorder)   . Peripheral neuropathy     Patient Active Problem List   Diagnosis Date Noted  . Sepsis (HCC) 04/09/2017  . ANEMIA, IRON DEFICIENCY 03/30/2010  . LEUKOCYTOSIS 03/30/2010  . PANIC ATTACK 03/30/2010  . OBSESSION 03/30/2010  . ADJUSTMENT DISORDER 03/30/2010  . DEPRESSION 03/30/2010  . OTHER SPECIFIED HEADACHE SYNDROMES 03/30/2010  . HYPERTENSION 03/30/2010  . URI 03/30/2010  . ASPLENIA 03/30/2010  . INSOMNIA 03/30/2010  . LUQ PAIN 03/30/2010    Past Surgical History:  Procedure Laterality Date  . SPLENECTOMY      OB History    No data available        Home Medications    Prior to Admission medications   Medication Sig Start Date End Date Taking? Authorizing Provider  aspirin 81 MG tablet Take 81 mg by mouth daily.    [provider]  baclofen (LIORESAL) 20 MG tablet Take 20 mg by mouth 2 (two) times daily.    [provider]  calcium carbonate (OS-CAL) 600 MG TABS Take 600 mg by mouth 2 (two) times daily with a meal.    [provider]  cephALEXin (KEFLEX) 500 MG capsule Take 1 capsule (500 mg total) by mouth 4 (four) times daily. 06/21/12   Mabe, Latanya Maudlin, MD  citalopram (CELEXA) 40 MG tablet Take 40 mg by mouth daily.    [provider]  clonazePAM (KLONOPIN) 0.5 MG tablet Take 0.5 mg by mouth 2 (two) times daily as needed for anxiety.    [provider]  cyanocobalamin 1000 MCG tablet Inject 1,000 mcg into the skin every 30 (thirty) days.    [provider]  ferrous sulfate 325 (65 FE) MG tablet Take 325 mg by mouth daily with breakfast.    [provider]  fish oil-omega-3 fatty acids 1000 MG capsule Take 1 g by mouth daily.    [provider]  fluconazole (DIFLUCAN) 100 MG tablet Take 100 mg by mouth once a week.    [provider]  fluticasone (FLONASE) 50  MCG/ACT nasal spray Place 2 sprays into the nose daily.    [provider]  folic acid (FOLVITE) 1 MG tablet Take 1 mg by mouth daily.    [provider]  furosemide (LASIX) 20 MG tablet Take 20 mg by mouth daily.    [provider]  gabapentin (NEURONTIN) 300 MG capsule Take 600 mg by mouth 4 (four) times daily.    [provider]  LISINOPRIL PO Take by mouth.    [provider]  LORazepam (ATIVAN) 0.5 MG tablet Take 0.5 mg by mouth every 8 (eight) hours.    [provider]  METFORMIN HCL PO Take by mouth.    [provider]  METOPROLOL SUCCINATE PO Take by mouth.    [provider]  modafinil (PROVIGIL) 100 MG tablet Take  100 mg by mouth daily.    [provider]  niacin (NIASPAN) 500 MG CR tablet Take 500 mg by mouth at bedtime.    [provider]  oxyCODONE-acetaminophen (PERCOCET/ROXICET) 5-325 MG per tablet Take 1 tablet by mouth every 4 (four) hours as needed for pain.    [provider]  promethazine (PHENERGAN) 25 MG tablet Take 25 mg by mouth every 6 (six) hours as needed for nausea.    [provider]  propranolol (INDERAL) 60 MG tablet Take 60 mg by mouth daily.    [provider]  TiZANidine HCl (ZANAFLEX PO) Take by mouth.    [provider]  vitamin C (ASCORBIC ACID) 500 MG tablet Take 500 mg by mouth daily.    [provider]    Family History No family history on file.  Social History Social History   Tobacco Use  . Smoking status: Never Smoker  . Smokeless tobacco: Never Used  Substance Use Topics  . Alcohol use: No  . Drug use: No     Allergies   Ceftin [cefuroxime axetil]; Cinobac [cinoxacin]; and Sulfa antibiotics   Review of Systems Review of Systems  Constitutional: Positive for chills, fatigue and fever.  HENT: Negative for congestion and sore throat.   Respiratory: Negative for cough and shortness of breath.   Cardiovascular: Negative for chest pain.  Gastrointestinal: Positive for abdominal distention, abdominal pain, constipation, nausea and vomiting. Negative for diarrhea.  Genitourinary: Negative for dysuria, flank pain, frequency, hematuria and urgency.  Musculoskeletal: Positive for arthralgias and myalgias.  Skin: Positive for rash.  Neurological: Negative for weakness and headaches.  All other systems reviewed and are negative.    Physical Exam Updated Vital Signs BP 132/78   Pulse (!) 106   Temp 98.7 F (37.1 C) (Oral)   Resp (!) 23   Ht 5' (1.524 m)   Wt 83.5 kg (184 lb)   SpO2 94%   BMI 35.94 kg/m   Physical Exam  Constitutional: She is oriented to person, place, and time. She  appears well-developed and well-nourished. She appears ill.  HENT:  Head: Normocephalic and atraumatic.  Mouth/Throat: Oropharynx is clear and moist.  Eyes: Conjunctivae are normal.  Neck: Neck supple.  Cardiovascular: Normal rate and regular rhythm.  No murmur heard. Pulmonary/Chest: Effort normal and breath sounds normal. No respiratory distress.  Abdominal: Soft. Normal appearance and bowel sounds are normal. There is no tenderness. There is no rigidity, no rebound, no guarding and no CVA tenderness.  Genitourinary:  Genitourinary Comments: Exam deferred  Musculoskeletal: She exhibits no edema.  Neurological: She is alert and oriented to person, place, and time.  Skin: Skin  is warm and dry.  Psychiatric: She has a normal mood and affect.  Nursing note and vitals reviewed.    ED Treatments / Results  Labs (all labs ordered are listed, but only abnormal results are displayed) Labs Reviewed  URINALYSIS, ROUTINE W REFLEX MICROSCOPIC - Abnormal; Notable for the following components:      Result Value   APPearance CLOUDY (*)    Specific Gravity, Urine >1.030 (*)    Ketones, ur 15 (*)    Leukocytes, UA TRACE (*)    All other components within normal limits  COMPREHENSIVE METABOLIC PANEL - Abnormal; Notable for the following components:   CO2 17 (*)    Glucose, Bld 135 (*)    Calcium 8.4 (*)    Total Protein 6.2 (*)    Albumin 3.4 (*)    AST 57 (*)    ALT 60 (*)    All other components within normal limits  CBC WITH DIFFERENTIAL/PLATELET - Abnormal; Notable for the following components:   WBC 27.7 (*)    Neutro Abs 18.0 (*)    Lymphs Abs 8.0 (*)    Monocytes Absolute 1.1 (*)    All other components within normal limits  URINALYSIS, MICROSCOPIC (REFLEX) - Abnormal; Notable for the following components:   Bacteria, UA RARE (*)    Squamous Epithelial / LPF 0-5 (*)    All other components within normal limits  I-STAT CG4 LACTIC ACID, ED - Abnormal; Notable for the following  components:   Lactic Acid, Venous 3.34 (*)    All other components within normal limits  I-STAT CG4 LACTIC ACID, ED - Abnormal; Notable for the following components:   Lactic Acid, Venous 2.85 (*)    All other components within normal limits  URINE CULTURE  CULTURE, BLOOD (ROUTINE X 2)  CULTURE, BLOOD (ROUTINE X 2)  PREGNANCY, URINE  LIPASE, BLOOD  INFLUENZA PANEL BY PCR (TYPE A & B)  PATHOLOGIST SMEAR REVIEW    EKG  EKG Interpretation None       Radiology [place] Abdomen Limited  Result Date: 04/09/2017 CLINICAL DATA:  42 year old female with right upper quadrant pain and fever. Chronic right upper quadrant pain for 2 years. Prior splenectomy. Subsequent encounter. EXAM: ULTRASOUND ABDOMEN LIMITED RIGHT UPPER QUADRANT COMPARISON:  04/05/2017 CT. FINDINGS: Gallbladder: No gallstones or wall thickening visualized. No sonographic Murphy sign noted by sonographer. Common bile duct: Diameter: 8 mm. Distal aspect not visualized secondary to bowel gas. Liver: Liver of increased echogenicity consistent with fatty infiltration and/or hepatocellular disease. No obvious focal mass although difficult to penetrate. Portal vein is patent on color Doppler imaging with normal direction of blood flow towards the liver. IMPRESSION: Gallbladder within normal limits. Prominent proximal common bile duct measuring 8 mm. Distal aspect not visualized secondary to bowel gas. On recent CT, no calcified common bile duct stone or pancreatic obstructing lesion was noted. If patient has elevated liver enzymes and further delineation were clinically desired than MRCP may be considered. Liver of increased echogenicity consistent with fatty infiltration and/or hepatocellular disease. No focal mass noted although liver was difficult to penetrate. Electronically Signed   By: Lacy DuverneySteven  Olson M.D.   On: 04/09/2017 18:42   Dg Chest Port 1 View  Result Date: 04/09/2017 CLINICAL DATA:  Fever, weakness, body aches. History of  hypertension and diabetes EXAM: PORTABLE CHEST 1 VIEW COMPARISON:  Chest radiograph July 26, 2016 FINDINGS: Consolidation periphery of LEFT lower lung zone. No pleural effusion. No pneumothorax. Cardiomediastinal silhouette is normal. Surgical clips  project in LEFT upper quadrant. IMPRESSION: LEFT lower lung zone pneumonia. Followup PA and lateral chest X-ray is recommended in 3-4 weeks following trial of antibiotic therapy to ensure resolution and exclude underlying malignancy. Electronically Signed   By: Awilda Metro M.D.   On: 04/09/2017 16:49    Procedures Procedures (including critical care time) CRITICAL CARE Performed by: Lyndel Safe Total critical care time: 30 minutes Critical care time was exclusive of separately billable procedures and treating other patients. Critical care was necessary to treat or prevent imminent or life-threatening deterioration. Critical care was time spent personally by me on the following activities: development of treatment plan with patient and/or surrogate as well as nursing, discussions with consultants, evaluation of patient's response to treatment, examination of patient, obtaining history from patient or surrogate, ordering and performing treatments and interventions, ordering and review of laboratory studies, ordering and review of radiographic studies, pulse oximetry and re-evaluation of patient's condition.  Sepsis requiring 3 IV antibiotics, multiple fluid boluses.  Medications Ordered in ED Medications  aztreonam (AZACTAM) 2 g in sodium chloride 0.9 % 100 mL IVPB (not administered)  aztreonam (AZACTAM) 2 g in sodium chloride 0.9 % 100 mL IVPB (not administered)  vancomycin (VANCOCIN) IVPB 1000 mg/200 mL premix (not administered)  metroNIDAZOLE (FLAGYL) IVPB 500 mg (not administered)  vancomycin (VANCOCIN) 1-5 GM/200ML-% IVPB (not administered)  vancomycin (VANCOCIN) 1-5 GM/200ML-% IVPB (not administered)  ibuprofen (ADVIL,MOTRIN) tablet  400 mg (400 mg Oral Given 04/09/17 1401)  sodium chloride 0.9 % bolus 1,000 mL (0 mLs Intravenous Stopped 04/09/17 1901)  morphine 4 MG/ML injection 4 mg (4 mg Intravenous Given 04/09/17 1709)  ondansetron (ZOFRAN) injection 4 mg (4 mg Intravenous Given 04/09/17 1708)  vancomycin (VANCOCIN) 2,000 mg in sodium chloride 0.9 % 500 mL IVPB (2,000 mg Intravenous New Bag/Given 04/09/17 2055)  metroNIDAZOLE (FLAGYL) IVPB 500 mg (0 mg Intravenous Stopped 04/09/17 1902)  sodium chloride 0.9 % bolus 1,000 mL (1,000 mLs Intravenous New Bag/Given 04/09/17 1902)  ipratropium-albuterol (DUONEB) 0.5-2.5 (3) MG/3ML nebulizer solution 3 mL (3 mLs Nebulization Given 04/09/17 1853)  morphine 4 MG/ML injection 4 mg (4 mg Intravenous Given 04/09/17 2054)  LORazepam (ATIVAN) tablet 0.5 mg (0.5 mg Oral Given 04/09/17 2228)     Initial Impression / Assessment and Plan / ED Course  I have reviewed the triage vital signs and the nursing notes.  Pertinent labs & imaging results that were available during my care of the patient were reviewed by me and considered in my medical decision making (see chart for details).  Clinical Course as of Apr 09 2356  Mon Apr 09, 2017  1633 Asked pharmacy to help with sepsis antibiotics  [EH]  1652 Large pneumonia on left side.  DG Chest Port 1 View [EH]  [phone] Sepsis reassessment completed.   [EH]  1837 Patient reports mild SOB.  Requests neb treatment.  Orders placed. No hypoxia  [EH]  2046 Spoke with Dr. Fredrich Romans who will admit patient.   [EH]  2219 Pt requesting home dose of medication for anxiety.   [JR]    Clinical Course User Index [EH] Cristina Gong, PA-C [JR] [place], [place], [place]   Patient presents today for evaluation of generally not feeling well with fevers and body aches from neck to toes."Labs were obtained and reviewed, white count 27.7, she was febrile and tachycardic.  Code sepsis was called for unknown source.  Patient is asplenic.  Chest x-ray was obtained  showing a large left-sided pneumonia.  Abdominal exam  was benign, no localized tenderness, rebound, or guarding.  She reported abdominal pain, however reports that it is more of her upper and lower back, arms, legs.  Based on this with benign abdominal exam CT abdomen pelvis is not ordered.  She does have a slight bump in her LFTs  With her AST 57, ALT 60.  Chart review shows that she normally does not have elevated LFTs.  Based on this and generally not feeling well right upper quadrant ultrasound was performed without cause for her symptoms.  Prior to ultrasound resulting she was started on broad-spectrum antibiotics.  Pharmacy was consulted, appreciate their assistance.  Due to patient's multiple allergies, desire to cover for known pneumonia, possible right upper quadrant given elevated LFTs patient was started on vancomycin, flagyl and azotreonam.   Patient was given 2 fluid boluses in the emergency room a total of 2 L.  She was not given full 30/kg as her lactic was under 4 and she was not hypotensive.  She did have elevated lactic acid, initially 3.34.  Hospitalist at [facility] was consulted for admission who agreed to admit patient.    Final Clinical Impressions(s) / ED Diagnoses   Final diagnoses:  Sepsis, due to unspecified organism [facility])  Community acquired pneumonia of left lower lobe of lung [facility])    ED Discharge Orders    None       Norman Clay 04/10/17 0006    Rolan Bucco, MD 04/10/17 1510

## 2017-04-10 ENCOUNTER — Other Ambulatory Visit: Payer: Self-pay

## 2017-04-10 ENCOUNTER — Inpatient Hospital Stay (HOSPITAL_COMMUNITY): Payer: Medicare HMO

## 2017-04-10 ENCOUNTER — Encounter (HOSPITAL_COMMUNITY): Payer: Self-pay

## 2017-04-10 DIAGNOSIS — J189 Pneumonia, unspecified organism: Secondary | ICD-10-CM | POA: Diagnosis present

## 2017-04-10 DIAGNOSIS — J181 Lobar pneumonia, unspecified organism: Secondary | ICD-10-CM | POA: Diagnosis not present

## 2017-04-10 DIAGNOSIS — E75249 Niemann-Pick disease, unspecified: Secondary | ICD-10-CM | POA: Diagnosis present

## 2017-04-10 DIAGNOSIS — I10 Essential (primary) hypertension: Secondary | ICD-10-CM

## 2017-04-10 DIAGNOSIS — E669 Obesity, unspecified: Secondary | ICD-10-CM

## 2017-04-10 DIAGNOSIS — E1169 Type 2 diabetes mellitus with other specified complication: Secondary | ICD-10-CM | POA: Diagnosis not present

## 2017-04-10 DIAGNOSIS — A419 Sepsis, unspecified organism: Secondary | ICD-10-CM

## 2017-04-10 LAB — CBC WITH DIFFERENTIAL/PLATELET
BASOS ABS: 0.2 10*3/uL — AB (ref 0.0–0.1)
BASOS PCT: 1 %
Eosinophils Absolute: 0.8 10*3/uL — ABNORMAL HIGH (ref 0.0–0.7)
Eosinophils Relative: 4 %
HEMATOCRIT: 35.3 % — AB (ref 36.0–46.0)
HEMOGLOBIN: 11.2 g/dL — AB (ref 12.0–15.0)
LYMPHS PCT: 36 %
Lymphs Abs: 7.6 10*3/uL — ABNORMAL HIGH (ref 0.7–4.0)
MCH: 28.5 pg (ref 26.0–34.0)
MCHC: 31.7 g/dL (ref 30.0–36.0)
MCV: 89.8 fL (ref 78.0–100.0)
MONOS PCT: 7 %
Monocytes Absolute: 1.5 10*3/uL — ABNORMAL HIGH (ref 0.1–1.0)
NEUTROS ABS: 10.9 10*3/uL — AB (ref 1.7–7.7)
NEUTROS PCT: 52 %
Platelets: 452 10*3/uL — ABNORMAL HIGH (ref 150–400)
RBC: 3.93 MIL/uL (ref 3.87–5.11)
RDW: 13.9 % (ref 11.5–15.5)
WBC: 21 10*3/uL — ABNORMAL HIGH (ref 4.0–10.5)

## 2017-04-10 LAB — BASIC METABOLIC PANEL
ANION GAP: 9 (ref 5–15)
BUN: 6 mg/dL (ref 6–20)
CALCIUM: 8.1 mg/dL — AB (ref 8.9–10.3)
CO2: 21 mmol/L — AB (ref 22–32)
CREATININE: 0.48 mg/dL (ref 0.44–1.00)
Chloride: 106 mmol/L (ref 101–111)
GFR calc non Af Amer: >60 mL/min (ref >60)
Glucose, Bld: 202 mg/dL — ABNORMAL HIGH (ref 65–99)
Potassium: 3.6 mmol/L (ref 3.5–5.1)
SODIUM: 136 mmol/L (ref 135–145)

## 2017-04-10 LAB — LACTIC ACID, PLASMA
Lactic Acid, Venous: 2.9 mmol/L (ref 0.5–1.9)
Lactic Acid, Venous: 1.9 mmol/L (ref 0.5–1.9)

## 2017-04-10 LAB — PROTIME-INR
INR: 1.04
Prothrombin Time: 13.6 seconds (ref 11.4–15.2)

## 2017-04-10 LAB — PROCALCITONIN

## 2017-04-10 LAB — STREP PNEUMONIAE URINARY ANTIGEN: Strep Pneumo Urinary Antigen: NEGATIVE

## 2017-04-10 LAB — HEPATIC FUNCTION PANEL
ALT: 45 U/L (ref 14–54)
AST: 30 U/L (ref 15–41)
Albumin: 2.9 g/dL — ABNORMAL LOW (ref 3.5–5.0)
Alkaline Phosphatase: 89 U/L (ref 38–126)
BILIRUBIN DIRECT: 0.1 mg/dL (ref 0.1–0.5)
BILIRUBIN TOTAL: 0.4 mg/dL (ref 0.3–1.2)
Indirect Bilirubin: 0.3 mg/dL (ref 0.3–0.9)
Total Protein: 5.4 g/dL — ABNORMAL LOW (ref 6.5–8.1)

## 2017-04-10 LAB — APTT: APTT: 29 s (ref 24–36)

## 2017-04-10 LAB — PATHOLOGIST SMEAR REVIEW

## 2017-04-10 LAB — HIV ANTIBODY (ROUTINE TESTING W REFLEX): HIV SCREEN 4TH GENERATION: NONREACTIVE

## 2017-04-10 MED ORDER — FOLIC ACID 1 MG PO TABS
1.0000 mg | ORAL_TABLET | Freq: Every day | ORAL | Status: DC
Start: 2017-04-10 — End: 2017-04-10
  Administered 2017-04-10: 1 mg via ORAL
  Filled 2017-04-10: qty 1

## 2017-04-10 MED ORDER — DICYCLOMINE HCL 10 MG PO CAPS: 10.0000 mg | ORAL_CAPSULE | Freq: Three times a day (TID) | ORAL | 0 refills | Status: DC

## 2017-04-10 MED ORDER — LEVOFLOXACIN 750 MG PO TABS: 750.0000 mg | ORAL_TABLET | Freq: Every day | ORAL | Status: DC

## 2017-04-10 MED ORDER — FERROUS SULFATE 325 (65 FE) MG PO TABS
325.0000 mg | ORAL_TABLET | Freq: Every day | ORAL | Status: DC
  Administered 2017-04-10: 325 mg via ORAL
  Filled 2017-04-10: qty 1

## 2017-04-10 MED ORDER — DIAZEPAM 5 MG/ML IJ SOLN
5.0000 mg | Freq: Once | INTRAMUSCULAR | Status: AC
  Administered 2017-04-10: 5 mg via INTRAVENOUS
  Filled 2017-04-10: qty 2

## 2017-04-10 MED ORDER — GABAPENTIN 300 MG PO CAPS
300.0000 mg | ORAL_CAPSULE | Freq: Three times a day (TID) | ORAL | Status: DC
  Administered 2017-04-10: 300 mg via ORAL
  Filled 2017-04-10: qty 1

## 2017-04-10 MED ORDER — SODIUM CHLORIDE 0.9 % IV BOLUS (SEPSIS): 500.0000 mL | Freq: Once | INTRAVENOUS | Status: DC

## 2017-04-10 MED ORDER — LEVOFLOXACIN 750 MG PO TABS: 750.0000 mg | ORAL_TABLET | Freq: Every day | ORAL | 0 refills | Status: DC

## 2017-04-10 MED ORDER — CITALOPRAM HYDROBROMIDE 20 MG PO TABS
40.0000 mg | ORAL_TABLET | Freq: Every day | ORAL | Status: DC
  Administered 2017-04-10: 40 mg via ORAL
  Filled 2017-04-10: qty 2

## 2017-04-10 MED ORDER — CLONAZEPAM 0.5 MG PO TABS
0.5000 mg | ORAL_TABLET | Freq: Three times a day (TID) | ORAL | Status: DC | PRN
  Administered 2017-04-10: 0.5 mg via ORAL
  Filled 2017-04-10: qty 1

## 2017-04-10 MED ORDER — LISINOPRIL 5 MG PO TABS: 2.5000 mg | ORAL_TABLET | Freq: Every day | ORAL | Status: DC

## 2017-04-10 MED ORDER — SODIUM CHLORIDE 0.9 % IV SOLN
INTRAVENOUS | Status: DC
  Administered 2017-04-10: 05:00:00 via INTRAVENOUS

## 2017-04-10 MED ORDER — AZTREONAM 1 G IJ SOLR
INTRAMUSCULAR | Status: AC
  Filled 2017-04-10: qty 2

## 2017-04-10 MED ORDER — ACETAMINOPHEN 650 MG RE SUPP: 650.0000 mg | Freq: Four times a day (QID) | RECTAL | Status: DC | PRN

## 2017-04-10 MED ORDER — CALCIUM CARBONATE-VITAMIN D 500-200 MG-UNIT PO TABS
1.0000 | ORAL_TABLET | Freq: Two times a day (BID) | ORAL | Status: DC
  Administered 2017-04-10: 1 via ORAL
  Filled 2017-04-10: qty 1

## 2017-04-10 MED ORDER — PANTOPRAZOLE SODIUM 40 MG PO TBEC
40.0000 mg | DELAYED_RELEASE_TABLET | Freq: Every day | ORAL | Status: DC
  Administered 2017-04-10: 40 mg via ORAL
  Filled 2017-04-10: qty 1

## 2017-04-10 MED ORDER — ATORVASTATIN CALCIUM 40 MG PO TABS: 40.0000 mg | ORAL_TABLET | Freq: Every day | ORAL | Status: DC

## 2017-04-10 MED ORDER — VITAMIN B-12 1000 MCG PO TABS
1000.0000 ug | ORAL_TABLET | Freq: Every day | ORAL | Status: DC
  Administered 2017-04-10: 1000 ug via ORAL
  Filled 2017-04-10: qty 1

## 2017-04-10 MED ORDER — VITAMIN C 500 MG PO TABS
500.0000 mg | ORAL_TABLET | Freq: Every day | ORAL | Status: DC
  Administered 2017-04-10: 500 mg via ORAL
  Filled 2017-04-10: qty 1

## 2017-04-10 MED ORDER — ENOXAPARIN SODIUM 40 MG/0.4ML ~~LOC~~ SOLN: 40.0000 mg | SUBCUTANEOUS | Status: DC

## 2017-04-10 MED ORDER — ONDANSETRON HCL 4 MG/2ML IJ SOLN: 4.0000 mg | Freq: Four times a day (QID) | INTRAMUSCULAR | Status: DC | PRN

## 2017-04-10 MED ORDER — LEVOFLOXACIN IN D5W 750 MG/150ML IV SOLN
750.0000 mg | Freq: Every day | INTRAVENOUS | Status: DC
  Administered 2017-04-10: 750 mg via INTRAVENOUS
  Filled 2017-04-10: qty 150

## 2017-04-10 MED ORDER — DESIPRAMINE HCL 10 MG PO TABS
10.0000 mg | ORAL_TABLET | Freq: Every day | ORAL | Status: DC
  Filled 2017-04-10: qty 1

## 2017-04-10 MED ORDER — ACETAMINOPHEN 325 MG PO TABS: 650.0000 mg | ORAL_TABLET | Freq: Four times a day (QID) | ORAL | Status: DC | PRN

## 2017-04-10 MED ORDER — ONDANSETRON HCL 4 MG PO TABS: 4.0000 mg | ORAL_TABLET | Freq: Four times a day (QID) | ORAL | Status: DC | PRN

## 2017-04-10 MED ORDER — METOPROLOL TARTRATE 25 MG PO TABS
12.5000 mg | ORAL_TABLET | Freq: Two times a day (BID) | ORAL | Status: DC
  Administered 2017-04-10: 12.5 mg via ORAL
  Filled 2017-04-10: qty 1

## 2017-04-10 NOTE — Care Management CC44 (Signed)
Condition Code 44 Documentation Completed  Patient Details  Name: Megan Weber MRN: [id number] Date of Birth: [birth year]   Condition Code 44 given:  Yes Patient signature on Condition Code 44 notice:  Yes Documentation of 2 MD's agreement:  Yes Code 44 added to claim:  Yes    Geni BersMcGibboney, Audi Wettstein, RN 04/10/2017, 2:35 PM

## 2017-04-10 NOTE — ED Notes (Signed)
Attempted report x 1. RN busy. Gave callback number.

## 2017-04-10 NOTE — Progress Notes (Signed)
Pt discharged to home, instructions reviewed with patient, acknowledge understanding. SRP,RN

## 2017-04-10 NOTE — Progress Notes (Signed)
CRITICAL VALUE ALERT  Critical Value: Lactic 2.9  Date & Time Notied:  04/10/17 0532  Provider Notified: Schorr  Orders Received/Actions taken: New orders placed

## 2017-04-10 NOTE — H&P (Signed)
History and Physical    Megan AlbeMelissa Demetro UJW:[id number] DOB: 03/01/1975 DOA: 04/09/2017  PCP: Shelbie AmmonsHaque, Imran P, MD  Patient coming from: Home.  Chief Complaint: Fever and chills.  HPI: Megan Weber is a 42 y.o. female with history of Niemann-Pick's disease with splenectomy, hypertension and diabetes mellitus [facility] with complaints of fever and chills.  Patient [facility] when patient started developing fever and chills.  At the Medical Center it was noted that patient had a fever of 102 F.  On the way back home since she was still febrile patient went to the ER at [facility] and over temperature recorded over the air was around 101 F with patient being tachycardic and blood work showed leukocytosis of 25,000.  Patient states she has been having some left flank pain last Thursday 4 days ago which resolved without any intervention.  Since today morning she has been having some right upper quadrant pain.  LFTs were mildly elevated.  Sonogram of the abdomen done does not show any signs of acute cholecystitis but does show some dilated ducts and radiologist recommended further workup with MRCP.  X-rays revealed left-sided pneumonic process.  Patient was started on empiric antibiotics and admitted for further management.  ED Course: On my exam patient is having some runny nose which patient states is started.  Influenza PCR is negative.  Patient will be continued on antibiotics.  Abdomen appears benign at the time of my exam.  Review of Systems: As per HPI, rest all negative.   Past Medical History:  Diagnosis Date  . Abnormal glucose   . Adjustment disorder   . Asplenia   . Cerebral degeneration associated with generalized lipidosis   . Depression   . Diabetes mellitus without complication (HCC)   . Hypertension   . OCD (obsessive compulsive disorder)   . Peripheral neuropathy     Past  Surgical History:  Procedure Laterality Date  . SPLENECTOMY    . SPLENECTOMY       reports that  has never smoked. she has never used smokeless tobacco. She reports that she does not drink alcohol or use drugs.  Allergies  Allergen Reactions  . Ceftin [Cefuroxime Axetil] Shortness Of Breath and Swelling  . Cinobac [Cinoxacin] Shortness Of Breath and Swelling  . Fire [employer] Anaphylaxis  . Sulfa Antibiotics Other (See Comments)    Unknown     Family History  Problem Relation Age of Onset  . Diabetes Mellitus II Father     Prior to Admission medications   Medication Sig Start Date End Date Taking? Authorizing Provider  atorvastatin (LIPITOR) 40 MG tablet Take 40 mg by mouth at bedtime. 03/02/17  Yes [provider]  calcium-vitamin D (OSCAL WITH D) 500-200 MG-UNIT tablet Take 1 tablet by mouth 2 (two) times daily.   Yes [provider]  citalopram (CELEXA) 40 MG tablet Take 40 mg by mouth daily after breakfast.    Yes [provider]  clonazePAM (KLONOPIN) 0.5 MG tablet Take 0.5 mg by mouth 3 (three) times daily as needed for anxiety.    Yes [provider]  Cranberry 500 MG CHEW Chew 500 mg by mouth at bedtime.   Yes [provider]  desipramine (NOPRAMIN) 10 MG tablet Take 10 mg by mouth at bedtime.  03/30/17  Yes [provider]  EPINEPHrine (EPIPEN 2-PAK) 0.3 mg/0.3 mL IJ SOAJ injection Inject  0.3 mg into the muscle daily as needed (allergic reaction).   Yes [provider]  ferrous sulfate 325 (65 FE) MG tablet Take 325 mg by mouth daily with breakfast.   Yes [provider]  fluconazole (DIFLUCAN) 100 MG tablet Take 100 mg by mouth daily as needed (yeast infection).    Yes [provider]  fluticasone (FLONASE) 50 MCG/ACT nasal spray Place 2 sprays into the nose daily as needed for allergies.    Yes [provider]  folic acid (FOLVITE) 1 MG tablet Take 1 mg by mouth daily after breakfast.    Yes  [provider]  gabapentin (NEURONTIN) 300 MG capsule Take 300 mg by mouth 3 (three) times daily.    Yes [provider]  lisinopril (PRINIVIL,ZESTRIL) 2.5 MG tablet Take 2.5 mg by mouth at bedtime.  03/02/17  Yes [provider]  medroxyPROGESTERone Acetate 150 MG/ML SUSY Inject 150 mg as directed every 3 (three) months. 03/31/17  Yes [provider]  meloxicam (MOBIC) 7.5 MG tablet Take 7.5 mg by mouth daily as needed for pain.  03/02/17  Yes [provider]  metFORMIN (GLUCOPHAGE) 1000 MG tablet Take 1,000 mg by mouth 2 (two) times daily. 03/02/17  Yes [provider]  metoprolol tartrate (LOPRESSOR) 25 MG tablet Take 12.5 mg by mouth 2 (two) times daily. 03/02/17  Yes [provider]  nitrofurantoin, macrocrystal-monohydrate, (MACROBID) 100 MG capsule Take 100 mg by mouth 2 (two) times daily. 04/05/17  Yes [provider]  omeprazole (PRILOSEC) 20 MG capsule Take 20 mg by mouth at bedtime.  03/02/17  Yes [provider]  ondansetron (ZOFRAN-ODT) 8 MG disintegrating tablet Take 8 mg by mouth every 8 (eight) hours as needed for nausea or vomiting.   Yes [provider]  tiZANidine (ZANAFLEX) 4 MG tablet Take 4 mg by mouth every 6 (six) hours as needed for muscle spasms.  03/02/17  Yes [provider]  vitamin B-12 (CYANOCOBALAMIN) 1000 MCG tablet Take 1,000 mcg by mouth daily after breakfast.    Yes [provider]  vitamin C (ASCORBIC ACID) 500 MG tablet Take 500 mg by mouth daily after breakfast.    Yes [provider]    Physical Exam: Vitals:   04/09/17 2300 04/09/17 2330 04/10/17 0030 04/10/17 0100  BP: 132/78 137/87 128/78 (!) 141/78  Pulse: (!) 106 (!) 106 (!) 110 (!) 110  Resp: (!) 23 19 (!) 26 (!) 23  Temp:      TempSrc:      SpO2: 94% 94% 97% 95%  Weight:      Height:          Constitutional: Moderately built and nourished. Vitals:   04/09/17 2300 04/09/17 2330 04/10/17  0030 04/10/17 0100  BP: 132/78 137/87 128/78 (!) 141/78  Pulse: (!) 106 (!) 106 (!) 110 (!) 110  Resp: (!) 23 19 (!) 26 (!) 23  Temp:      TempSrc:      SpO2: 94% 94% 97% 95%  Weight:      Height:       Eyes: Anicteric no pallor. ENMT: No discharge from the ears eyes nose or mouth. Neck: No mass felt.  No neck rigidity. Respiratory: No rhonchi or crepitations. Cardiovascular: S1-S2 heard no murmurs appreciated. Abdomen: Soft nontender bowel sounds present. Musculoskeletal: No edema.  No joint effusion. Skin: No rash. Neurologic: Alert awake oriented to time place and person.  Moves all extremities. Psychiatric: Appears normal.  Normal affect.  Labs on Admission: I have personally reviewed following labs and imaging studies  CBC: Recent Labs  Lab 04/09/17 1530  WBC 27.7*  NEUTROABS 18.0*  HGB 12.9  HCT 39.5  MCV 87.6  PLT 211   Basic Metabolic Panel: Recent Labs  Lab 04/09/17 1530  NA 135  K 4.0  CL 107  CO2 17*  GLUCOSE 135*  BUN 8  CREATININE 0.65  CALCIUM 8.4*   GFR: Estimated Creatinine Clearance: 90.5 mL/min (by C-G formula based on SCr of 0.65 mg/dL). Liver Function Tests: Recent Labs  Lab 04/09/17 1530  AST 57*  ALT 60*  ALKPHOS 103  BILITOT 0.3  PROT 6.2*  ALBUMIN 3.4*   Recent Labs  Lab 04/09/17 1530  LIPASE 18   No results for input(s): AMMONIA in the last 168 hours. Coagulation Profile: No results for input(s): INR, PROTIME in the last 168 hours. Cardiac Enzymes: No results for input(s): CKTOTAL, CKMB, CKMBINDEX, TROPONINI in the last 168 hours. BNP (last 3 results) No results for input(s): PROBNP in the last 8760 hours. HbA1C: No results for input(s): HGBA1C in the last 72 hours. CBG: No results for input(s): GLUCAP in the last 168 hours. Lipid Profile: No results for input(s): CHOL, HDL, LDLCALC, TRIG, CHOLHDL, LDLDIRECT in the last 72 hours. Thyroid Function Tests: No results for input(s): TSH, T4TOTAL, FREET4, T3FREE,  THYROIDAB in the last 72 hours. Anemia Panel: No results for input(s): VITAMINB12, FOLATE, FERRITIN, TIBC, IRON, RETICCTPCT in the last 72 hours. Urine analysis:    Component Value Date/Time   COLORURINE YELLOW 04/09/2017 1400   APPEARANCEUR CLOUDY (A) 04/09/2017 1400   LABSPEC >1.030 (H) 04/09/2017 1400   PHURINE 5.5 04/09/2017 1400   GLUCOSEU NEGATIVE 04/09/2017 1400   HGBUR NEGATIVE 04/09/2017 1400   BILIRUBINUR NEGATIVE 04/09/2017 1400   KETONESUR 15 (A) 04/09/2017 1400   PROTEINUR NEGATIVE 04/09/2017 1400   UROBILINOGEN 0.2 06/21/2012 1652   NITRITE NEGATIVE 04/09/2017 1400   LEUKOCYTESUR TRACE (A) 04/09/2017 1400   Sepsis Labs: @LABRCNTIP (procalcitonin:4,lacticidven:4) )No results found for this or any previous visit (from the past 240 hour(s)).   Radiological Exams on Admission: US Abdomen Limited  Result Date: 04/09/2017 CLINICAL DATA:  42 year old female with right upper quadrant pain and fever. Chronic right upper quadrant pain for 2 years. Prior splenectomy. Subsequent encounter. EXAM: ULTRASOUND ABDOMEN LIMITED RIGHT UPPER QUADRANT COMPARISON:  04/05/2017 CT. FINDINGS: Gallbladder: No gallstones or wall thickening visualized. No sonographic Murphy sign noted by sonographer. Common bile duct: Diameter: 8 mm. Distal aspect not visualized secondary to bowel gas. Liver: Liver of increased echogenicity consistent with fatty infiltration and/or hepatocellular disease. No obvious focal mass although difficult to penetrate. Portal vein is patent on color Doppler imaging with normal direction of blood flow towards the liver. IMPRESSION: Gallbladder within normal limits. Prominent proximal common bile duct measuring 8 mm. Distal aspect not visualized secondary to bowel gas. On recent CT, no calcified common bile duct stone or pancreatic obstructing lesion was noted. If patient has elevated liver enzymes and further delineation were clinically desired than MRCP may be considered. Liver of  increased echogenicity consistent with fatty infiltration and/or hepatocellular disease. No focal mass noted although liver was difficult to penetrate. Electronically Signed   By: Lacy Duverney M.D.   On: 04/09/2017 18:42   Dg Chest Port 1 View  Result Date: 04/09/2017 CLINICAL DATA:  Fever, weakness, body aches. History of hypertension and diabetes EXAM: PORTABLE CHEST 1 VIEW COMPARISON:  Chest radiograph July 26, 2016 FINDINGS: Consolidation periphery  of LEFT lower lung zone. No pleural effusion. No pneumothorax. Cardiomediastinal silhouette is normal. Surgical clips project in LEFT upper quadrant. IMPRESSION: LEFT lower lung zone pneumonia. Followup PA and lateral chest X-ray is recommended in 3-4 weeks following trial of antibiotic therapy to ensure resolution and exclude underlying malignancy. Electronically Signed   By: Awilda Metro M.D.   On: 04/09/2017 16:49    Assessment/Plan Principal Problem:   Sepsis (HCC) Active Problems:   Essential hypertension   ASPLENIA   Niemann-Pick disease (HCC)   Diabetes mellitus type 2 in obese [place])   Community acquired pneumonia of left lower lobe of lung (HCC)    1. Sepsis likely from pneumonia -I have reviewed patient's antibiotics with pharmacist.  Patient has previously received ciprofloxacin without difficulty and since patient probably has community-acquired pneumonia I have ordered Levaquin.  Patient also states she has taken ciprofloxacin recently.  Follow urine for Legionella strep antigen blood cultures sputum cultures.  Continue with hydration and follow lactate levels and pro calcitonin levels. 2. Elevated LFTs with sonogram showing some dilated ducts -we will check MRCP. 3. Diabetes mellitus type 2 -will place patient on sliding scale coverage and hold metformin while inpatient. 4. Hypertension on metoprolol and lisinopril. 5. History of Niemann-Pick disease with splenectomy. 6. On Lipitor gabapentin and Desipraminie.   DVT  prophylaxis: Lovenox. Code Status: Full code. Family Communication: Discussed with patient. Disposition Plan: Home. Consults called: None. Admission status: Inpatient.   Eduard Clos MD [facility] Pager [phone].  If 7PM-7AM, please contact night-coverage [email or web address] Password [facility]  04/10/2017, 4:32 AM

## 2017-04-10 NOTE — Care Management Obs Status (Signed)
MEDICARE OBSERVATION STATUS NOTIFICATION   Patient Details  Name: Devoria AlbeMelissa Kintzel MRN: [id number] Date of Birth: [birth year]   Medicare Observation Status Notification Given:  Yes    Geni BersMcGibboney, Shephanie Romas, RN 04/10/2017, 2:35 PM

## 2017-04-10 NOTE — Discharge Summary (Signed)
Physician Discharge Summary  Megan Weber ZOX:[id number] DOB: [birth year] DOA: 04/09/2017  PCP: Shelbie Ammons, MD  Admit date: 04/09/2017 Discharge date: 04/10/2017  Admitted From: Home Disposition: Home  Discharge Condition:Stable CODE STATUS:Full Diet recommendation:Carb Modified  Brief/Interim Summary: Admission H and P: Megan Weber is a 42 y.o. female with history of Niemann-Pick's disease with splenectomy, hypertension and diabetes mellitus presents to the ER admits in [facility] with complaints of fever and chills.  Patient had gone to visit her urologist at [facility] when patient started developing fever and chills.  [facility] the [facility] it was noted that patient had a fever of 102 F.  On the way back home since she was still febrile patient went to the ER at [facility] and over temperature recorded over the air was around 101 F with patient being tachycardic and blood work showed leukocytosis of 25,000.  Patient states she has been having some left flank pain last Thursday 4 days ago which resolved without any intervention.  Since today morning she has been having some right upper quadrant pain.  LFTs were mildly elevated.  Sonogram of the abdomen done does not show any signs of acute cholecystitis but does show some dilated ducts and radiologist recommended further workup with MRCP.  X-rays revealed left-sided pneumonic process.  Patient was started on empiric antibiotics and admitted for further management.  Hospital Course: Patient was started on IV levofloxacin.  Patient was seen and examined the bedside this morning.  Currently her abdominal pain has significantly improved.  Her respiratory status is stable.  She is saturating fine on room air.  She is afebrile.  She underwent MRCP today which showed borderline biliary dilatation with the common hepatic duct at 5 mm in diameter and the common bile duct at 4 mm in diameter. No abnormal filling  defect is identified. No intrahepatic biliary dilatation. Diffuse hepatic steatosis. Patient feels comfortable.  She can take the antibiotic orally.  She is stable for discharge to home today.  She needs to check her CBC in a week during follow-up with her PCP.  Problems were addressed during her hospitalization:  1. Sepsis likely from pneumonia -Afebrile this morning.  Lactic acidosis has resolved.  Patient's vitals are stable . 2. Elevated LFTs with sonogram showing some dilated ducts - MRCP report as above.  Liver enzymes normalized today 3. Diabetes mellitus type 2 -Continue home regimen 4. Hypertension :On metoprolol and lisinopril.  Currently blood pressure stable 5. History of Niemann-Pick disease with splenectomy: Follows at [facility]     Discharge Diagnoses:  Principal Problem:   Sepsis (HCC) Active Problems:   Essential hypertension   ASPLENIA   Niemann-Pick disease (HCC)   Diabetes mellitus type 2 in obese (HCC)   Community acquired pneumonia of left lower lobe of lung [facility])    Discharge Instructions  Discharge Instructions    Diet - low sodium heart healthy   Complete by:  As directed    Discharge instructions   Complete by:  As directed    1) Follow up with your PCP in a week. 2) Take prescribed medications as instructed. 3)Do CBC/BMP test in a week. 4) Do a Chest Xray to follow up for pneumonia in 3-4 weeks.   Increase activity slowly   Complete by:  As directed      Allergies as of 04/10/2017      Reactions   Ceftin [cefuroxime Axetil] Shortness Of Breath, Swelling   Cinobac [cinoxacin] Shortness  Of Breath, Swelling   Fire Ant Anaphylaxis   Sulfa Antibiotics Other (See Comments)   Unknown       Medication List    TAKE these medications   atorvastatin 40 MG tablet Commonly known as:  LIPITOR Take 40 mg by mouth at bedtime.   calcium-vitamin D 500-200 MG-UNIT tablet Commonly known as:  OSCAL WITH D Take 1 tablet by mouth 2 (two) times daily.    citalopram 40 MG tablet Commonly known as:  CELEXA Take 40 mg by mouth daily after breakfast.   clonazePAM 0.5 MG tablet Commonly known as:  KLONOPIN Take 0.5 mg by mouth 3 (three) times daily as needed for anxiety.   Cranberry 500 MG Chew Chew 500 mg by mouth at bedtime.   desipramine 10 MG tablet Commonly known as:  NOPRAMIN Take 10 mg by mouth at bedtime.   dicyclomine 10 MG capsule Commonly known as:  BENTYL Take 1 capsule (10 mg total) by mouth 3 (three) times daily before meals for 5 days.   EPIPEN 2-PAK 0.3 mg/0.3 mL Soaj injection Generic drug:  EPINEPHrine Inject 0.3 mg into the muscle daily as needed (allergic reaction).   ferrous sulfate 325 (65 FE) MG tablet Take 325 mg by mouth daily with breakfast.   fluconazole 100 MG tablet Commonly known as:  DIFLUCAN Take 100 mg by mouth daily as needed (yeast infection).   fluticasone 50 MCG/ACT nasal spray Commonly known as:  FLONASE Place 2 sprays into the nose daily as needed for allergies.   folic acid 1 MG tablet Commonly known as:  FOLVITE Take 1 mg by mouth daily after breakfast.   gabapentin 300 MG capsule Commonly known as:  NEURONTIN Take 300 mg by mouth 3 (three) times daily.   levofloxacin 750 MG tablet Commonly known as:  LEVAQUIN Take 1 tablet (750 mg total) by mouth daily. Start taking on:  04/11/2017   lisinopril 2.5 MG tablet Commonly known as:  PRINIVIL,ZESTRIL Take 2.5 mg by mouth at bedtime.   medroxyPROGESTERone Acetate 150 MG/ML Susy Inject 150 mg as directed every 3 (three) months.   meloxicam 7.5 MG tablet Commonly known as:  MOBIC Take 7.5 mg by mouth daily as needed for pain.   metFORMIN 1000 MG tablet Commonly known as:  GLUCOPHAGE Take 1,000 mg by mouth 2 (two) times daily.   metoprolol tartrate 25 MG tablet Commonly known as:  LOPRESSOR Take 12.5 mg by mouth 2 (two) times daily.   nitrofurantoin (macrocrystal-monohydrate) 100 MG capsule Commonly known as:   MACROBID Take 100 mg by mouth 2 (two) times daily.   omeprazole 20 MG capsule Commonly known as:  PRILOSEC Take 20 mg by mouth at bedtime.   ondansetron 8 MG disintegrating tablet Commonly known as:  ZOFRAN-ODT Take 8 mg by mouth every 8 (eight) hours as needed for nausea or vomiting.   tiZANidine 4 MG tablet Commonly known as:  ZANAFLEX Take 4 mg by mouth every 6 (six) hours as needed for muscle spasms.   vitamin B-12 1000 MCG tablet Commonly known as:  CYANOCOBALAMIN Take 1,000 mcg by mouth daily after breakfast.   vitamin C 500 MG tablet Commonly known as:  ASCORBIC ACID Take 500 mg by mouth daily after breakfast.      Follow-up Information    Shelbie Ammons, MD. Schedule an appointment as soon as possible for a visit in 1 week(s).   Specialty:  Internal Medicine Contact information: [address] [place] [address] [phone]  Allergies  Allergen Reactions  . Ceftin [Cefuroxime Axetil] Shortness Of Breath and Swelling  . Cinobac [Cinoxacin] Shortness Of Breath and Swelling  . Fire [employer] Anaphylaxis  . Sulfa Antibiotics Other (See Comments)    Unknown     Consultations: None  Procedures/Studies: Mr Abdomen Mrcp Wo Contrast  Result Date: 04/10/2017 CLINICAL DATA:  Right upper quadrant abdominal pain and fever. Prior splenectomy. Niemann-Pick disease. EXAM: MRI ABDOMEN WITHOUT CONTRAST  (INCLUDING MRCP) TECHNIQUE: Multiplanar multisequence MR imaging of the abdomen was performed. Heavily T2-weighted images of the biliary and pancreatic ducts were obtained, and three-dimensional MRCP images were rendered by post processing. COMPARISON:  Multiple exams, including 04/09/2017 ultrasound and CT from 04/05/2017 FINDINGS: Despite efforts by the technologist and patient, motion artifact is present on today's exam and could not be eliminated. This reduces exam sensitivity and specificity. Lower chest: Unremarkable Hepatobiliary: Diffuse hepatic steatosis  with dropout of signal on out of phase images in the hepatic parenchyma. Borderline enlarged liver. The common hepatic duct measures 5 mm in diameter and the common bile duct measures 4 mm in diameter. No obvious filling defects in the gallbladder. No significant extrahepatic biliary irregularity, to the extent that this could be assessed given the degree of motion artifact. No intrahepatic biliary dilatation. No focal hepatic lesion is observed. Pancreas:  Unremarkable Spleen:  Splenectomy Adrenals/Urinary Tract:  Unremarkable Stomach/Bowel: Unremarkable. The appendix is visualized and appears normal. Vascular/Lymphatic: A peripancreatic node measures 0.9 cm in short axis on image 33/3, stable from 04/05/2017. Portacaval node 0.6 cm in short axis. No overtly pathologic adenopathy. Other:  No supplemental non-categorized findings. Musculoskeletal: Unremarkable IMPRESSION: 1. Borderline biliary dilatation with the common hepatic duct at 5 mm in diameter and the common bile duct at 4 mm in diameter. No abnormal filling defect is identified. No intrahepatic biliary dilatation. Reduced sensitivity for subtle biliary abnormalities due to extensive motion artifact. 2. Diffuse hepatic steatosis. Borderline hepatomegaly. Prior splenectomy. Electronically Signed   By: Gaylyn RongWalter  Liebkemann M.D.   On: 04/10/2017 09:37   Mr 3d Recon At Scanner  Result Date: 04/10/2017 CLINICAL DATA:  Right upper quadrant abdominal pain and fever. Prior splenectomy. Niemann-Pick disease. EXAM: MRI ABDOMEN WITHOUT CONTRAST  (INCLUDING MRCP) TECHNIQUE: Multiplanar multisequence MR imaging of the abdomen was performed. Heavily T2-weighted images of the biliary and pancreatic ducts were obtained, and three-dimensional MRCP images were rendered by post processing. COMPARISON:  Multiple exams, including 04/09/2017 ultrasound and CT from 04/05/2017 FINDINGS: Despite efforts by the technologist and patient, motion artifact is present on today's exam  and could not be eliminated. This reduces exam sensitivity and specificity. Lower chest: Unremarkable Hepatobiliary: Diffuse hepatic steatosis with dropout of signal on out of phase images in the hepatic parenchyma. Borderline enlarged liver. The common hepatic duct measures 5 mm in diameter and the common bile duct measures 4 mm in diameter. No obvious filling defects in the gallbladder. No significant extrahepatic biliary irregularity, to the extent that this could be assessed given the degree of motion artifact. No intrahepatic biliary dilatation. No focal hepatic lesion is observed. Pancreas:  Unremarkable Spleen:  Splenectomy Adrenals/Urinary Tract:  Unremarkable Stomach/Bowel: Unremarkable. The appendix is visualized and appears normal. Vascular/Lymphatic: A peripancreatic node measures 0.9 cm in short axis on image 33/3, stable from 04/05/2017. Portacaval node 0.6 cm in short axis. No overtly pathologic adenopathy. Other:  No supplemental non-categorized findings. Musculoskeletal: Unremarkable IMPRESSION: 1. Borderline biliary dilatation with the common hepatic duct at 5 mm in diameter and the common bile duct at  4 mm in diameter. No abnormal filling defect is identified. No intrahepatic biliary dilatation. Reduced sensitivity for subtle biliary abnormalities due to extensive motion artifact. 2. Diffuse hepatic steatosis. Borderline hepatomegaly. Prior splenectomy. Electronically Signed   By: Gaylyn Rong M.D.   On: 04/10/2017 09:37   US Abdomen Limited  Result Date: 04/09/2017 CLINICAL DATA:  42 year old female with right upper quadrant pain and fever. Chronic right upper quadrant pain for 2 years. Prior splenectomy. Subsequent encounter. EXAM: ULTRASOUND ABDOMEN LIMITED RIGHT UPPER QUADRANT COMPARISON:  04/05/2017 CT. FINDINGS: Gallbladder: No gallstones or wall thickening visualized. No sonographic Murphy sign noted by sonographer. Common bile duct: Diameter: 8 mm. Distal aspect not visualized  secondary to bowel gas. Liver: Liver of increased echogenicity consistent with fatty infiltration and/or hepatocellular disease. No obvious focal mass although difficult to penetrate. Portal vein is patent on color Doppler imaging with normal direction of blood flow towards the liver. IMPRESSION: Gallbladder within normal limits. Prominent proximal common bile duct measuring 8 mm. Distal aspect not visualized secondary to bowel gas. On recent CT, no calcified common bile duct stone or pancreatic obstructing lesion was noted. If patient has elevated liver enzymes and further delineation were clinically desired than MRCP may be considered. Liver of increased echogenicity consistent with fatty infiltration and/or hepatocellular disease. No focal mass noted although liver was difficult to penetrate. Electronically Signed   By: Lacy Duverney M.D.   On: 04/09/2017 18:42   Dg Chest Port 1 View  Result Date: 04/09/2017 CLINICAL DATA:  Fever, weakness, body aches. History of hypertension and diabetes EXAM: PORTABLE CHEST 1 VIEW COMPARISON:  Chest radiograph July 26, 2016 FINDINGS: Consolidation periphery of LEFT lower lung zone. No pleural effusion. No pneumothorax. Cardiomediastinal silhouette is normal. Surgical clips project in LEFT upper quadrant. IMPRESSION: LEFT lower lung zone pneumonia. Followup PA and lateral chest X-ray is recommended in 3-4 weeks following trial of antibiotic therapy to ensure resolution and exclude underlying malignancy. Electronically Signed   By: Awilda Metro M.D.   On: 04/09/2017 16:49    (Echo, Carotid, EGD, Colonoscopy, ERCP)    Subjective: Patient seen and examined at bedside this morning.  Feels comfortable.  Abdomen pain has improved.  She is afebrile.  Hemodynamically stable.  Stable for discharge home.  Patient agrees with the plan.  Discharge Exam: Vitals:   04/10/17 0454 04/10/17 1017  BP: 105/66 120/72  Pulse: 90 96  Resp: 17   Temp: 99.2 F (37.3 C)   SpO2:  98%    Vitals:   04/10/17 0100 04/10/17 0150 04/10/17 0454 04/10/17 1017  BP: (!) 141/78 (!) 155/103 105/66 120/72  Pulse: (!) 110 (!) 103 90 96  Resp: (!) 23 20 17    Temp:  98.9 F (37.2 C) 99.2 F (37.3 C)   TempSrc:  Oral Oral   SpO2: 95% 97% 98%   Weight:  85.6 kg (188 lb 11.4 oz)    Height:  5' (1.524 m)      General: Pt is alert, awake, not in acute distress,obese Cardiovascular: RRR, S1/S2 +, no rubs, no gallops Respiratory: CTA bilaterally, no wheezing, no rhonchi Abdominal: Soft, NT, ND, bowel sounds + Extremities: no edema, no cyanosis    The results of significant diagnostics from this hospitalization (including imaging, microbiology, ancillary and laboratory) are listed below for reference.     Microbiology: No results found for this or any previous visit (from the past 240 hour(s)).   Labs: BNP (last 3 results) No results for input(s): BNP in  the last 8760 hours. Basic Metabolic Panel: Recent Labs  Lab 04/09/17 1530 04/10/17 0445  NA 135 136  K 4.0 3.6  CL 107 106  CO2 17* 21*  GLUCOSE 135* 202*  BUN 8 6  CREATININE 0.65 0.48  CALCIUM 8.4* 8.1*   Liver Function Tests: Recent Labs  Lab 04/09/17 1530 04/10/17 0445  AST 57* 30  ALT 60* 45  ALKPHOS 103 89  BILITOT 0.3 0.4  PROT 6.2* 5.4*  ALBUMIN 3.4* 2.9*   Recent Labs  Lab 04/09/17 1530  LIPASE 18   No results for input(s): AMMONIA in the last 168 hours. CBC: Recent Labs  Lab 04/09/17 1530 04/10/17 0445  WBC 27.7* 21.0*  NEUTROABS 18.0* 10.9*  HGB 12.9 11.2*  HCT 39.5 35.3*  MCV 87.6 89.8  PLT 211 452*   Cardiac Enzymes: No results for input(s): CKTOTAL, CKMB, CKMBINDEX, TROPONINI in the last 168 hours. BNP: Invalid input(s): POCBNP CBG: No results for input(s): GLUCAP in the last 168 hours. D-Dimer No results for input(s): DDIMER in the last 72 hours. Hgb A1c No results for input(s): HGBA1C in the last 72 hours. Lipid Profile No results for input(s): CHOL, HDL,  LDLCALC, TRIG, CHOLHDL, LDLDIRECT in the last 72 hours. Thyroid function studies No results for input(s): TSH, T4TOTAL, T3FREE, THYROIDAB in the last 72 hours.  Invalid input(s): FREET3 Anemia work up No results for input(s): VITAMINB12, FOLATE, FERRITIN, TIBC, IRON, RETICCTPCT in the last 72 hours. Urinalysis    Component Value Date/Time   COLORURINE YELLOW 04/09/2017 1400   APPEARANCEUR CLOUDY (A) 04/09/2017 1400   LABSPEC >1.030 (H) 04/09/2017 1400   PHURINE 5.5 04/09/2017 1400   GLUCOSEU NEGATIVE 04/09/2017 1400   HGBUR NEGATIVE 04/09/2017 1400   BILIRUBINUR NEGATIVE 04/09/2017 1400   KETONESUR 15 (A) 04/09/2017 1400   PROTEINUR NEGATIVE 04/09/2017 1400   UROBILINOGEN 0.2 06/21/2012 1652   NITRITE NEGATIVE 04/09/2017 1400   LEUKOCYTESUR TRACE (A) 04/09/2017 1400   Sepsis Labs Invalid input(s): PROCALCITONIN,  WBC,  LACTICIDVEN Microbiology No results found for this or any previous visit (from the past 240 hour(s)).   Time coordinating discharge: Over 30 minutes  SIGNED:   Meredith Leeds, MD  [facility] 04/10/2017, 10:43 AM Pager [phone]  If 7PM-7AM, please contact night-coverage [email or web address] Password [email or web address]

## 2017-04-11 LAB — URINE CULTURE: Culture: NO GROWTH

## 2017-04-12 LAB — LEGIONELLA PNEUMOPHILA SEROGP 1 UR AG: L. PNEUMOPHILA SEROGP 1 UR AG: NEGATIVE

## 2017-04-14 LAB — CULTURE, BLOOD (ROUTINE X 2)
CULTURE: NO GROWTH
Culture: NO GROWTH
Special Requests: ADEQUATE

## 2019-04-11 ENCOUNTER — Other Ambulatory Visit: Payer: Self-pay

## 2019-04-11 ENCOUNTER — Emergency Department (HOSPITAL_BASED_OUTPATIENT_CLINIC_OR_DEPARTMENT_OTHER): Payer: Medicare HMO

## 2019-04-11 ENCOUNTER — Emergency Department (HOSPITAL_BASED_OUTPATIENT_CLINIC_OR_DEPARTMENT_OTHER)
Admission: EM | Admit: 2019-04-11 | Discharge: 2019-04-12 | Disposition: A | Discharge status: Home / Self Care | Payer: Medicare HMO | Attending: Emergency Medicine | Admitting: Emergency Medicine

## 2019-04-11 ENCOUNTER — Encounter (HOSPITAL_BASED_OUTPATIENT_CLINIC_OR_DEPARTMENT_OTHER): Payer: Self-pay | Admitting: *Deleted

## 2019-04-11 DIAGNOSIS — E119 Type 2 diabetes mellitus without complications: Secondary | ICD-10-CM | POA: Insufficient documentation

## 2019-04-11 DIAGNOSIS — Z882 Allergy status to sulfonamides status: Secondary | ICD-10-CM | POA: Diagnosis not present

## 2019-04-11 DIAGNOSIS — Z881 Allergy status to other antibiotic agents status: Secondary | ICD-10-CM | POA: Insufficient documentation

## 2019-04-11 DIAGNOSIS — R519 Headache, unspecified: Secondary | ICD-10-CM | POA: Diagnosis not present

## 2019-04-11 DIAGNOSIS — M542 Cervicalgia: Secondary | ICD-10-CM | POA: Insufficient documentation

## 2019-04-11 DIAGNOSIS — Z7984 Long term (current) use of oral hypoglycemic drugs: Secondary | ICD-10-CM | POA: Diagnosis not present

## 2019-04-11 DIAGNOSIS — Z888 Allergy status to other drugs, medicaments and biological substances status: Secondary | ICD-10-CM | POA: Insufficient documentation

## 2019-04-11 DIAGNOSIS — Z79899 Other long term (current) drug therapy: Secondary | ICD-10-CM | POA: Insufficient documentation

## 2019-04-11 DIAGNOSIS — R42 Dizziness and giddiness: Secondary | ICD-10-CM | POA: Diagnosis not present

## 2019-04-11 DIAGNOSIS — I10 Essential (primary) hypertension: Secondary | ICD-10-CM | POA: Insufficient documentation

## 2019-04-11 HISTORY — DX: Cerebral aneurysm, nonruptured: I67.1

## 2019-04-11 LAB — BASIC METABOLIC PANEL
Anion gap: 12 (ref 5–15)
BUN: 8 mg/dL (ref 6–20)
CO2: 23 mmol/L (ref 22–32)
Calcium: 9.1 mg/dL (ref 8.9–10.3)
Chloride: 103 mmol/L (ref 98–111)
Creatinine, Ser: 0.76 mg/dL (ref 0.44–1.00)
GFR calc Af Amer: >60 mL/min (ref >60)
GFR calc non Af Amer: >60 mL/min (ref >60)
Glucose, Bld: 175 mg/dL — ABNORMAL HIGH (ref 70–99)
Potassium: 3.6 mmol/L (ref 3.5–5.1)
Sodium: 138 mmol/L (ref 135–145)

## 2019-04-11 LAB — CBC WITH DIFFERENTIAL/PLATELET
Abs Immature Granulocytes: 0.09 10*3/uL — ABNORMAL HIGH (ref 0.00–0.07)
Basophils Absolute: 0.1 10*3/uL (ref 0.0–0.1)
Basophils Relative: 1 %
Eosinophils Absolute: 0.5 10*3/uL (ref 0.0–0.5)
Eosinophils Relative: 3 %
HCT: 41.9 % (ref 36.0–46.0)
Hemoglobin: 13.4 g/dL (ref 12.0–15.0)
Immature Granulocytes: 0 %
Lymphocytes Relative: 44 %
Lymphs Abs: 8.9 10*3/uL — ABNORMAL HIGH (ref 0.7–4.0)
MCH: 29.5 pg (ref 26.0–34.0)
MCHC: 32 g/dL (ref 30.0–36.0)
MCV: 92.1 fL (ref 80.0–100.0)
Monocytes Absolute: 0.9 10*3/uL (ref 0.1–1.0)
Monocytes Relative: 4 %
Neutro Abs: 9.7 10*3/uL — ABNORMAL HIGH (ref 1.7–7.7)
Neutrophils Relative %: 48 %
Platelets: 537 10*3/uL — ABNORMAL HIGH (ref 150–400)
RBC: 4.55 MIL/uL (ref 3.87–5.11)
RDW: 13.5 % (ref 11.5–15.5)
Smear Review: INCREASED
WBC Morphology: ABNORMAL
WBC: 20.2 10*3/uL — ABNORMAL HIGH (ref 4.0–10.5)
nRBC: 0 % (ref 0.0–0.2)

## 2019-04-11 LAB — PREGNANCY, URINE: Preg Test, Ur: NEGATIVE

## 2019-04-11 MED ORDER — SODIUM CHLORIDE 0.9 % IV BOLUS
1000.0000 mL | Freq: Once | INTRAVENOUS | Status: AC
Start: 2019-04-11 — End: 2019-04-12
  Administered 2019-04-11: 1000 mL via INTRAVENOUS

## 2019-04-11 MED ORDER — DIPHENHYDRAMINE HCL 50 MG/ML IJ SOLN
25.0000 mg | Freq: Once | INTRAMUSCULAR | Status: AC
  Administered 2019-04-11: 25 mg via INTRAVENOUS
  Filled 2019-04-11: qty 1

## 2019-04-11 MED ORDER — IOHEXOL 350 MG/ML SOLN
100.0000 mL | Freq: Once | INTRAVENOUS | Status: AC | PRN
  Administered 2019-04-11: 100 mL via INTRAVENOUS

## 2019-04-11 MED ORDER — PROCHLORPERAZINE EDISYLATE 10 MG/2ML IJ SOLN
10.0000 mg | Freq: Once | INTRAMUSCULAR | Status: AC
  Administered 2019-04-11: 10 mg via INTRAVENOUS
  Filled 2019-04-11: qty 2

## 2019-04-11 NOTE — ED Triage Notes (Signed)
Pt reports right forearm pain-confirmed blood clot by ultrasound at PCP office today. States that now her right shoulder and into her right neck are tender. No obvious SOB, ambulatory.

## 2019-04-11 NOTE — ED Provider Notes (Signed)
[place] EMERGENCY DEPARTMENT Provider Note   CSN: [id number] Arrival date & time: 04/11/19  2142     History Chief Complaint  Patient presents with  . Neck Pain    Megan Weber is a 44 y.o. female.  Megan Weber is a 44 y.o. female with a history of Niemann-Pick disease, diabetes, cerebral aneurysm, asplenia, hypertension, OCD, peripheral neuropathy, who presents to the emergency department for evaluation of right-sided neck pain with associated headache and facial tingling.  Patient states that a few days ago she was having some tenderness in her right forearm went to see her PCP and after having ultrasound completed was diagnosed with superficial venous thrombosis, she reports she was told to treat this with aspirin and warm compresses and pain resolved then she began to have some pain into her shoulder and today has had pain over the right side of her neck.  She reports it is tender to the touch, she has not noted any discoloration.  She states that pain worsened and as she was getting ready to come here today she began to feel dizzy with some nausea.  She describes it as a room spinning sensation that is intermittent.  She also reports developing a headache that is right-sided, described as facial pressure with associated tingling, pins-and-needles sensation over the right side of her face.  She reports that the headache with tingling is what her typical migraine feels like but she has never had dizziness or neck pain before.  She denies any fevers or chills.  No vision changes.  No facial asymmetry.  No numbness weakness or tingling in her extremities.        Past Medical History:  Diagnosis Date  . Abnormal glucose   . Adjustment disorder   . Asplenia   . Cerebral aneurysm   . Cerebral degeneration associated with generalized lipidosis   . Depression   . Diabetes mellitus without complication ([place])   . Hypertension   . OCD (obsessive compulsive disorder)     . Peripheral neuropathy     Patient Active Problem List   Diagnosis Date Noted  . Niemann-Pick disease ([place]) 04/10/2017  . Diabetes mellitus type 2 in obese ([place]) 04/10/2017  . PNA (pneumonia) 04/10/2017  . Community acquired pneumonia of left lower lobe of lung   . Sepsis ([place]) 04/09/2017  . ANEMIA, IRON DEFICIENCY 03/30/2010  . LEUKOCYTOSIS 03/30/2010  . PANIC ATTACK 03/30/2010  . OBSESSION 03/30/2010  . ADJUSTMENT DISORDER 03/30/2010  . DEPRESSION 03/30/2010  . OTHER SPECIFIED HEADACHE SYNDROMES 03/30/2010  . Essential hypertension 03/30/2010  . URI 03/30/2010  . ASPLENIA 03/30/2010  . INSOMNIA 03/30/2010  . LUQ PAIN 03/30/2010    Past Surgical History:  Procedure Laterality Date  . SPLENECTOMY    . SPLENECTOMY       OB History   No obstetric history on file.     Family History  Problem Relation Age of Onset  . Diabetes Mellitus II Father     Social History   Tobacco Use  . Smoking status: Never Smoker  . Smokeless tobacco: Never Used  Substance Use Topics  . Alcohol use: No  . Drug use: No    Home Medications Prior to Admission medications   Medication Sig Start Date End Date Taking? Authorizing Provider  atorvastatin (LIPITOR) 40 MG tablet Take 40 mg by mouth at bedtime. 03/02/17   [provider]  calcium-vitamin D (OSCAL WITH D) 500-200 MG-UNIT tablet Take 1 tablet by mouth 2 (two)  times daily.    [provider]  citalopram (CELEXA) 40 MG tablet Take 40 mg by mouth daily after breakfast.     [provider]  clonazePAM (KLONOPIN) 0.5 MG tablet Take 0.5 mg by mouth 3 (three) times daily as needed for anxiety.     [provider]  Cranberry 500 MG CHEW Chew 500 mg by mouth at bedtime.    [provider]  desipramine (NOPRAMIN) 10 MG tablet Take 10 mg by mouth at bedtime.  03/30/17   [provider]  dicyclomine (BENTYL) 10 MG capsule Take 1 capsule (10 mg total) by mouth 3 (three) times daily before  meals for 5 days. 04/10/17 04/15/17  Burnadette Pop, MD  EPINEPHrine (EPIPEN 2-PAK) 0.3 mg/0.3 mL IJ SOAJ injection Inject 0.3 mg into the muscle daily as needed (allergic reaction).    [provider]  ferrous sulfate 325 (65 FE) MG tablet Take 325 mg by mouth daily with breakfast.    [provider]  fluconazole (DIFLUCAN) 100 MG tablet Take 100 mg by mouth daily as needed (yeast infection).     [provider]  fluticasone (FLONASE) 50 MCG/ACT nasal spray Place 2 sprays into the nose daily as needed for allergies.     [provider]  folic acid (FOLVITE) 1 MG tablet Take 1 mg by mouth daily after breakfast.     [provider]  gabapentin (NEURONTIN) 300 MG capsule Take 300 mg by mouth 3 (three) times daily.     [provider]  levofloxacin (LEVAQUIN) 750 MG tablet Take 1 tablet (750 mg total) by mouth daily. 04/11/17   Burnadette Pop, MD  lisinopril (PRINIVIL,ZESTRIL) 2.5 MG tablet Take 2.5 mg by mouth at bedtime.  03/02/17   [provider]  medroxyPROGESTERone Acetate 150 MG/ML SUSY Inject 150 mg as directed every 3 (three) months. 03/31/17   [provider]  meloxicam (MOBIC) 7.5 MG tablet Take 7.5 mg by mouth daily as needed for pain.  03/02/17   [provider]  metFORMIN (GLUCOPHAGE) 1000 MG tablet Take 1,000 mg by mouth 2 (two) times daily. 03/02/17   [provider]  metoprolol tartrate (LOPRESSOR) 25 MG tablet Take 12.5 mg by mouth 2 (two) times daily. 03/02/17   [provider]  nitrofurantoin, macrocrystal-monohydrate, (MACROBID) 100 MG capsule Take 100 mg by mouth 2 (two) times daily. 04/05/17   [provider]  omeprazole (PRILOSEC) 20 MG capsule Take 20 mg by mouth at bedtime.  03/02/17   [provider]  ondansetron (ZOFRAN-ODT) 8 MG disintegrating tablet Take 8 mg by mouth every 8 (eight) hours as needed for nausea or vomiting.    [provider]  tiZANidine (ZANAFLEX)  4 MG tablet Take 4 mg by mouth every 6 (six) hours as needed for muscle spasms.  03/02/17   [provider]  vitamin B-12 (CYANOCOBALAMIN) 1000 MCG tablet Take 1,000 mcg by mouth daily after breakfast.     [provider]  vitamin C (ASCORBIC ACID) 500 MG tablet Take 500 mg by mouth daily after breakfast.     [provider]    Allergies    Ceftin [cefuroxime axetil], Cinobac [cinoxacin], Fire ant, and Sulfa antibiotics  Review of Systems   Review of Systems  Constitutional: Negative for chills and fever.  Eyes: Negative for pain and visual disturbance.  Respiratory: Negative for cough and shortness of breath.   Cardiovascular: Negative for chest pain.  Gastrointestinal: Positive for nausea. Negative for abdominal  pain and vomiting.  Musculoskeletal: Positive for neck pain.  Skin: Negative for color change and rash.  Neurological: Positive for dizziness and headaches. Negative for seizures, syncope, facial asymmetry, speech difficulty, weakness, light-headedness and numbness.       Facial paresthesia  All other systems reviewed and are negative.   Physical Exam Updated Vital Signs BP (!) 140/92 (BP Location: Left Arm)   Pulse 99   Temp 98.7 F (37.1 C) (Oral)   Resp 18   Ht 5\' 1"  (1.549 m)   Wt 86.2 kg   SpO2 99%   BMI 35.90 kg/m   Physical Exam Vitals and nursing note reviewed.  Constitutional:      General: She is not in acute distress.    Appearance: She is well-developed. She is not diaphoretic.  HENT:     Head: Normocephalic and atraumatic.  Eyes:     General:        Right eye: No discharge.        Left eye: No discharge.     Extraocular Movements: Extraocular movements intact.     Pupils: Pupils are equal, round, and reactive to light.     Comments: No nystagmus  Neck:     Comments: Mild tenderness over the right lateral neck in distribution of SCM without palpable deformity or overlying skin changes.  No nuchal rigidity, negative  Kernig sign, full range of motion of the neck in all directions Cardiovascular:     Rate and Rhythm: Normal rate and regular rhythm.     Heart sounds: Normal heart sounds. No murmur. No friction rub. No gallop.   Pulmonary:     Effort: Pulmonary effort is normal. No respiratory distress.     Breath sounds: Normal breath sounds. No wheezing or rales.  Abdominal:     General: Bowel sounds are normal. There is no distension.     Palpations: Abdomen is soft. There is no mass.     Tenderness: There is no abdominal tenderness. There is no guarding.  Musculoskeletal:        General: No deformity.     Cervical back: Neck supple.     Comments: No tenderness erythema or warmth noted over the right upper extremity  Skin:    General: Skin is warm and dry.     Capillary Refill: Capillary refill takes less than 2 seconds.  Neurological:     Mental Status: She is alert.     Coordination: Coordination normal.     Comments: Speech is clear, able to follow commands CN III-XII intact Normal strength in upper and lower extremities bilaterally including dorsiflexion and plantar flexion, strong and equal grip strength Sensation normal to light and sharp touch Moves extremities without ataxia, coordination intact No pronator drift, normal finger to nose  Psychiatric:        Mood and Affect: Mood normal.        Behavior: Behavior normal.     ED Results / Procedures / Treatments   Labs (all labs ordered are listed, but only abnormal results are displayed) Labs Reviewed  CBC WITH DIFFERENTIAL/PLATELET - Abnormal; Notable for the following components:      Result Value   WBC 20.2 (*)    Platelets 537 (*)    All other components within normal limits  PREGNANCY, URINE  BASIC METABOLIC PANEL    EKG None  Radiology No results found.  Procedures Procedures (including critical care time)  Medications Ordered in ED Medications  sodium chloride 0.9 %  bolus 1,000 mL (has no administration in  time range)  prochlorperazine (COMPAZINE) injection 10 mg (has no administration in time range)  diphenhydrAMINE (BENADRYL) injection 25 mg (has no administration in time range)    ED Course  I have reviewed the triage vital signs and the nursing notes.  Pertinent labs & imaging results that were available during my care of the patient were reviewed by me and considered in my medical decision making (see chart for details).    MDM Rules/Calculators/A&P                     44 year old female presents with right-sided neck pain with right-sided headache and facial paresthesias as well as dizziness.  Had an episode of superficial thrombophlebitis earlier this week that has resolved, but now with this pain, history of migraines but has never experienced neck pain or dizziness with this previously.  Reports she is nauseated as well.  She has a known Pericallosal aneurysm, that is monitored by [facility].  Headache could be complex migraine which patient has a history of but with new dizziness and neck pain this also raises concern for potential vertebral artery dissection, versus complication from patient's known aneurysm.  Will get lab work and CTA of the head and neck. Headache cocktail to treat discomfort.  Lab work shows leukocytosis of 20.2 which appears to be chronic and unchanged for the patient she has no fevers or focal infectious symptoms, pregnancy is negative.  Glucose of 175 but no other electrolyte derangements and normal renal function.  At shift change CTAs pending, care signed out to Dr. Read Drivers who will follow up on scans, if normal, pt can be discharged home with close PCP follow up.  Final Clinical Impression(s) / ED Diagnoses Final diagnoses:  Right-sided headache  Neck pain  Dizziness    Rx / DC Orders ED Discharge Orders    None       Legrand Rams 04/11/19 2308    Terald Sleeper, MD 04/12/19 1004

## 2020-07-07 IMAGING — CT CT ANGIO NECK
1 of 22 series · 3 of 33 positions shown · IV contrast (Omnipaque)
Comparison: None.

CLINICAL DATA: Initial evaluation for headache, dizziness, neck
pain.

EXAM:
CT ANGIOGRAPHY HEAD AND NECK
TECHNIQUE: Multidetector CT imaging of the head and neck was performed using
the standard protocol during bolus administration of intravenous
contrast. Multiplanar CT image reconstructions and MIPs were
obtained to evaluate the vascular anatomy. Carotid stenosis
measurements (when applicable) are obtained utilizing NASCET
criteria, using the distal internal carotid diameter as the
denominator.
CONTRAST:  100mL OMNIPAQUE IOHEXOL 350 MG/ML SOLN

[Series 9: axial thin · axial · 0.47mm/px · z∈[+586,+906]mm · 3 of 321 slices shown]
[im 1/321  soft-tissue]
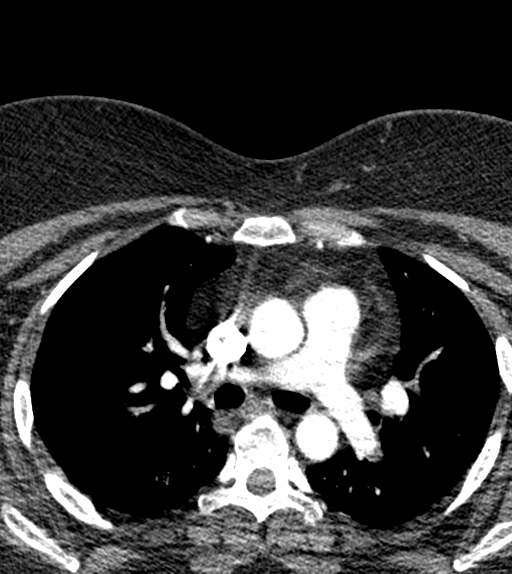
[im 161/321  bone]
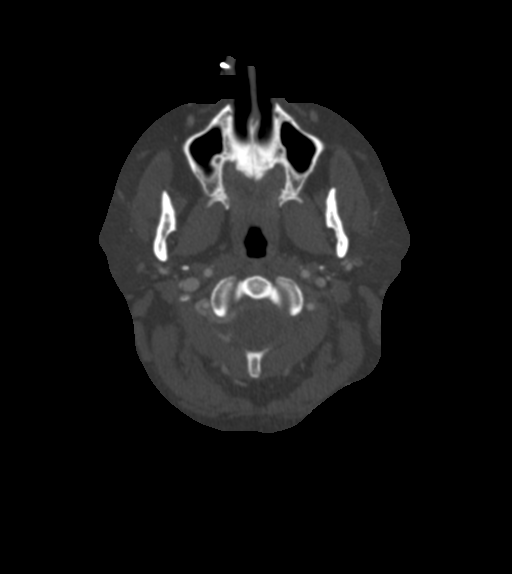
[im 321/321  soft-tissue]
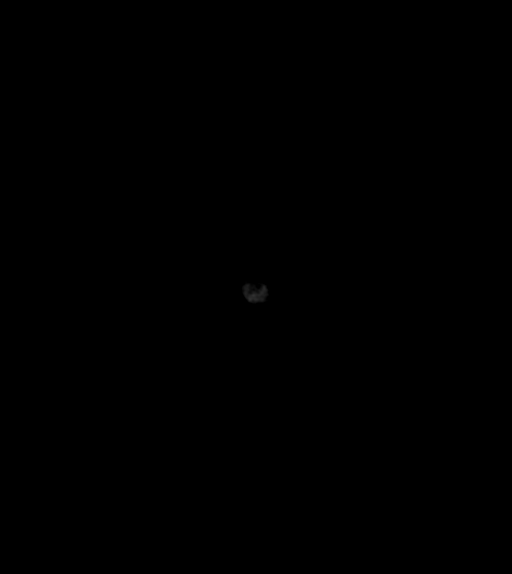

[3 of 33 positions shown; findings below may reference images not displayed]

FINDINGS: CT HEAD FINDINGS

Brain: Cerebral volume within normal limits for patient age.

No evidence for acute intracranial hemorrhage. No findings to
suggest acute large vessel territory infarct. No mass lesion,
midline shift, or mass effect. Ventricles are normal in size without
evidence for hydrocephalus. No extra-axial fluid collection
identified.

Vascular: No hyperdense vessel identified.

Skull: Scalp soft tissues demonstrate no acute abnormality.
Calvarium intact.

Sinuses/Orbits: Globes and orbital soft tissues within normal
limits.

Visualized paranasal sinuses are clear. No mastoid effusion.

CTA NECK FINDINGS

Aortic arch: Visualized aortic arch of normal caliber with normal
branch pattern. No flow-limiting stenosis about the origin of the
great vessels. Visualized subclavian arteries widely patent.

Right carotid system: Right common and internal carotid arteries
widely patent without stenosis, dissection, or occlusion.

Left carotid system: Left common and internal carotid arteries
widely patent without stenosis dissection or occlusion.

Vertebral arteries: Both vertebral arteries recently subclavian
arteries pre vertebral arteries widely patent within neck without
stenosis dissection or occlusion.

Skeleton: No acute osseous abnormality. No discrete or worrisome
osseous lesions.

Other neck: No other acute soft tissue abnormality in the neck. No
mass lesion or adenopathy.

Upper chest: Visualized upper chest demonstrates no acute finding.

Review of the MIP images confirms the above findings

CTA HEAD FINDINGS

Anterior circulation: Both internal carotid arteries widely patent
to the termini without stenosis. A1 segments widely patent. Normal
anterior communicating artery. Anterior cerebral arteries widely
patent to their distal aspects. 5 mm saccular pericallosal aneurysm
noted (series 10, image 80). M1 segments widely patent. Normal MCA
bifurcations. Distal MCA branches well perfused and symmetric.

Posterior circulation: Both vertebral arteries widely patent to the
vertebrobasilar junction. Both picas patent. Basilar widely patent
to its distal aspect. Superior cerebellar and posterior cerebral
arteries widely patent bilaterally.

Venous sinuses: Patent.

Anatomic variants: None significant.

Review of the MIP images confirms the above findings
IMPRESSION: CT HEAD IMPRESSION:

Normal head CT.  No acute intracranial abnormality identified.

CTA HEAD AND NECK IMPRESSION:

1. 5 mm saccular pericallosal aneurysm.
2. Otherwise unremarkable and normal CTA of the head and neck. No
large vessel occlusion, hemodynamically significant stenosis, or
other acute vascular abnormality.

## 2022-03-16 ENCOUNTER — Other Ambulatory Visit (HOSPITAL_COMMUNITY): Payer: Self-pay

## 2022-03-29 ENCOUNTER — Telehealth: Payer: Self-pay | Admitting: Pharmacy Technician

## 2023-02-26 ENCOUNTER — Ambulatory Visit: Payer: Medicare HMO | Admitting: Allergy and Immunology

## 2023-04-12 ENCOUNTER — Ambulatory Visit: Payer: Medicare HMO | Admitting: Allergy and Immunology

## 2023-04-12 ENCOUNTER — Encounter: Payer: Self-pay | Admitting: Allergy and Immunology

## 2023-04-12 VITALS — BP 110/62 | HR 103 | Resp 16 | Ht 61.0 in | Wt 166.8 lb

## 2023-04-12 DIAGNOSIS — Z91038 Other insect allergy status: Secondary | ICD-10-CM | POA: Diagnosis not present

## 2023-04-12 NOTE — Progress Notes (Signed)
 [place] - [place] - [place] - [place] - [place]   Dear Dr. Karna Christmas,  Thank you for referring Megan Weber to the [facility] [place] on 04/12/2023.   Below is a summation of this patient's evaluation and recommendations.  Thank you for your referral. I will keep you informed about this patient's response to treatment.   If you have any questions please do not hesitate to contact me.   Sincerely,  Jessica Priest, MD Allergy / Immunology [place] [facility]   ______________________________________________________________________    NEW PATIENT NOTE  Referring Provider: Galvin Proffer, MD Primary Provider: Galvin Proffer, MD Date of office visit: 04/12/2023    Subjective:   Chief Complaint:  Megan Weber (DOB: 08-12-1975) is a 48 y.o. female who presents to the clinic on 04/12/2023 with a chief complaint of Allergy Injections .     HPI: Megan Weber presents to this clinic in evaluation of immunotherapy.  She is undergoing a course of immunotherapy for fire ant administered by [facility] and she lives in [place] and she believes that it would be much more convenient to have her immunotherapy administered in this clinic.  She has had this form of immunotherapy for several years and she was at full dose but because of other medical problems she discontinued this form of therapy for about a year and she restarted in 2024 and she is now going through buildup without any problem.  She does have an injectable epinephrine device.  She informs me that she has Niemann-Pick disease and she has systemic mastocytosis for which care is being directed by [facility]  Past Medical History:  Diagnosis Date   Abnormal glucose    Adjustment disorder    Asplenia    Cerebral aneurysm    Cerebral degeneration associated with generalized lipidosis    Depression    Diabetes mellitus without complication  (HCC)    Hypertension    OCD (obsessive compulsive disorder)    Peripheral neuropathy     Past Surgical History:  Procedure Laterality Date   SPLENECTOMY     SPLENECTOMY      Allergies as of 04/12/2023       Reactions   Ceftin [cefuroxime Axetil] Shortness Of Breath, Swelling   Cinobac [cinoxacin] Shortness Of Breath, Swelling   Fire Ant Anaphylaxis   Sulfa Antibiotics Other (See Comments)   Unknown         Medication List    ascorbic acid 500 MG tablet Commonly known as: VITAMIN C Take 500 mg by mouth daily after breakfast.   atorvastatin 40 MG tablet Commonly known as: LIPITOR Take 40 mg by mouth at bedtime.   calcium-vitamin D 500-200 MG-UNIT tablet Commonly known as: OSCAL WITH D Take 1 tablet by mouth 2 (two) times daily.   citalopram 40 MG tablet Commonly known as: CELEXA Take 40 mg by mouth daily after breakfast.   clonazePAM 0.5 MG tablet Commonly known as: KLONOPIN Take 0.5 mg by mouth 3 (three) times daily as needed for anxiety.   Cranberry 500 MG Chew Chew 500 mg by mouth at bedtime.   cyanocobalamin 1000 MCG tablet Commonly known as: VITAMIN B12 Take 1,000 mcg by mouth daily after breakfast.   desipramine 10 MG tablet Commonly known as: NOPRAMIN Take 10 mg by mouth at bedtime.   dicyclomine 10 MG capsule Commonly known as: Bentyl Take 1 capsule (10 mg total) by mouth 3 (three) times  daily before meals for 5 days.   EpiPen 2-Pak 0.3 mg/0.3 mL Soaj injection Generic drug: EPINEPHrine Inject 0.3 mg into the muscle daily as needed (allergic reaction).   ferrous sulfate 325 (65 FE) MG tablet Take 325 mg by mouth daily with breakfast.   fluconazole 100 MG tablet Commonly known as: DIFLUCAN Take 100 mg by mouth daily as needed (yeast infection).   fluticasone 50 MCG/ACT nasal spray Commonly known as: FLONASE Place 2 sprays into the nose daily as needed for allergies.   folic acid 1 MG tablet Commonly known as: FOLVITE Take 1 mg by  mouth daily after breakfast.   gabapentin 300 MG capsule Commonly known as: NEURONTIN Take 300 mg by mouth 3 (three) times daily.   levofloxacin 750 MG tablet Commonly known as: LEVAQUIN Take 1 tablet (750 mg total) by mouth daily.   lisinopril 2.5 MG tablet Commonly known as: ZESTRIL Take 2.5 mg by mouth at bedtime.   medroxyPROGESTERone Acetate 150 MG/ML Susy Inject 150 mg as directed every 3 (three) months.   meloxicam 7.5 MG tablet Commonly known as: MOBIC Take 7.5 mg by mouth daily as needed for pain.   metFORMIN 1000 MG tablet Commonly known as: GLUCOPHAGE Take 1,000 mg by mouth 2 (two) times daily.   metoprolol tartrate 25 MG tablet Commonly known as: LOPRESSOR Take 12.5 mg by mouth 2 (two) times daily.   nitrofurantoin (macrocrystal-monohydrate) 100 MG capsule Commonly known as: MACROBID Take 100 mg by mouth 2 (two) times daily.   omeprazole 20 MG capsule Commonly known as: PRILOSEC Take 20 mg by mouth at bedtime.   ondansetron 8 MG disintegrating tablet Commonly known as: ZOFRAN-ODT Take 8 mg by mouth every 8 (eight) hours as needed for nausea or vomiting.   Oxycodone HCl 10 MG Tabs Take by mouth.   tiZANidine 4 MG tablet Commonly known as: ZANAFLEX Take 4 mg by mouth every 6 (six) hours as needed for muscle spasms.    Review of systems negative except as noted in HPI / PMHx or noted below:  Review of Systems  Constitutional: Negative.   HENT: Negative.    Eyes: Negative.   Respiratory: Negative.    Cardiovascular: Negative.   Gastrointestinal: Negative.   Genitourinary: Negative.   Musculoskeletal: Negative.   Skin: Negative.   Neurological: Negative.   Endo/Heme/Allergies: Negative.   Psychiatric/Behavioral: Negative.      Family History  Problem Relation Age of Onset   Diabetes Mellitus II Father     Social History   Socioeconomic History   Marital status: Divorced    Spouse name: Not on file   Number of children: Not on file    Years of education: Not on file   Highest education level: Not on file  Occupational History   Not on file  Tobacco Use   Smoking status: Never   Smokeless tobacco: Never  Substance and Sexual Activity   Alcohol use: No   Drug use: No   Sexual activity: Not on file  Other Topics Concern   Not on file  Social History Narrative   Not on file   Social Drivers of Health   Financial Resource Strain: High Risk (09/13/2021)   Received from [facility], [facility]   Overall Financial Resource Strain (CARDIA)    Difficulty of Paying Living Expenses: Very hard  Food Insecurity: No Food Insecurity (03/22/2022)   Received from [facility], [facility]   Hunger Vital Sign    Worried About Running  Out of Food in the Last Year: Never true    Ran Out of Food in the Last Year: Never true  Transportation Needs: No Transportation Needs (12/08/2021)   Received from [facility], [facility]   [facility] - Transportation    In the past 12 months, has lack of transportation kept you from medical appointments or from getting medications?: No    Lack of Transportation (Non-Medical): No  Physical Activity: Not on file  Stress: Stress Concern Present (09/13/2021)   Received from [facility], [facility] - Occupational Stress Questionnaire    Feeling of Stress : Very much  Social Connections: Unknown (09/13/2021)   Received from [facility], [facility]   Social Network    Social Network: Not on file  Intimate Partner Violence: Unknown (09/13/2021)   Received from [facility], [facility]   HITS    Physically Hurt: Not on file    Insult or Talk Down To: Not on file    Threaten Physical Harm: Not on file    Scream or Curse: Not on file    Environmental and Social history  Lives in a house with a dry environment, a dog located inside the household, no carpet in the bedroom, plastic on the bed, no  plastic on the pillow, and no smoking ongoing with inside the household.  Objective:   Vitals:   04/12/23 1351  BP: 110/62  Pulse: (!) 103  Resp: 16  SpO2: 99%   Height: 5\' 1"  (154.9 cm) Weight: 166 lb 12.8 oz (75.7 kg)  Physical Exam Constitutional:      Appearance: She is not diaphoretic.  HENT:     Head: Normocephalic.     Right Ear: Tympanic membrane, ear canal and external ear normal.     Left Ear: Tympanic membrane, ear canal and external ear normal.     Nose: Nose normal. No mucosal edema or rhinorrhea.     Mouth/Throat:     Pharynx: Uvula midline. No oropharyngeal exudate.  Eyes:     Conjunctiva/sclera: Conjunctivae normal.  Neck:     Thyroid: No thyromegaly.     Trachea: Trachea normal. No tracheal tenderness or tracheal deviation.  Cardiovascular:     Rate and Rhythm: Normal rate and regular rhythm.     Heart sounds: Normal heart sounds, S1 normal and S2 normal. No murmur heard. Pulmonary:     Effort: No respiratory distress.     Breath sounds: Normal breath sounds. No stridor. No wheezing or rales.  Lymphadenopathy:     Head:     Right side of head: No tonsillar adenopathy.     Left side of head: No tonsillar adenopathy.     Cervical: No cervical adenopathy.  Skin:    Findings: No erythema or rash.     Nails: There is no clubbing.  Neurological:     Mental Status: She is alert.     Diagnostics: None   Assessment and Plan:    1. 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  Apparently she will be building up to full maintenance dose and at that point in time she will be switching over her immunotherapy administration to this clinic.  This will obviously require some coordination with [facility] regarding paperwork and acquisition of her extract.  We will work through that issue sometime in the near future.  We will need to see her in this clinic once a year while  she receives immunotherapy in this clinic.  Jessica Priest, MD Allergy / Immunology [place] [facility] [place]

## 2023-04-16 ENCOUNTER — Encounter: Payer: Self-pay | Admitting: Allergy and Immunology

## 2023-08-08 ENCOUNTER — Ambulatory Visit (INDEPENDENT_AMBULATORY_CARE_PROVIDER_SITE_OTHER): Payer: Self-pay

## 2023-08-08 DIAGNOSIS — T63421D Toxic effect of venom of ants, accidental (unintentional), subsequent encounter: Secondary | ICD-10-CM

## 2023-08-08 DIAGNOSIS — Z91038 Other insect allergy status: Secondary | ICD-10-CM | POA: Diagnosis not present

## 2023-08-08 NOTE — Progress Notes (Signed)
 Immunotherapy   Patient Details  Name: Megan Weber MRN: [id number] Date of Birth: 1975/11/11  08/08/2023  Megan Weber has transferred to our office to receive allergy injections against fire ant from [place]. Frequency: Every 4 weeks ( see outside records for further info) Epi-Pen:Epi-Pen Available  Consent signed and patient instructions given. Patient waited 30 minutes in office without any issues.   Brantley Naser 08/08/2023, 11:05 AM

## 2023-09-05 ENCOUNTER — Ambulatory Visit (INDEPENDENT_AMBULATORY_CARE_PROVIDER_SITE_OTHER): Payer: Self-pay | Admitting: *Deleted

## 2023-09-05 DIAGNOSIS — Z91038 Other insect allergy status: Secondary | ICD-10-CM

## 2023-09-05 DIAGNOSIS — T63421D Toxic effect of venom of ants, accidental (unintentional), subsequent encounter: Secondary | ICD-10-CM

## 2023-10-03 ENCOUNTER — Ambulatory Visit (INDEPENDENT_AMBULATORY_CARE_PROVIDER_SITE_OTHER)

## 2023-10-03 DIAGNOSIS — Z91038 Other insect allergy status: Secondary | ICD-10-CM

## 2023-10-03 DIAGNOSIS — T63421D Toxic effect of venom of ants, accidental (unintentional), subsequent encounter: Secondary | ICD-10-CM

## 2023-11-01 ENCOUNTER — Ambulatory Visit (INDEPENDENT_AMBULATORY_CARE_PROVIDER_SITE_OTHER): Payer: Self-pay

## 2023-11-01 DIAGNOSIS — Z91038 Other insect allergy status: Secondary | ICD-10-CM

## 2023-11-01 DIAGNOSIS — T63421D Toxic effect of venom of ants, accidental (unintentional), subsequent encounter: Secondary | ICD-10-CM

## 2023-12-03 ENCOUNTER — Ambulatory Visit (INDEPENDENT_AMBULATORY_CARE_PROVIDER_SITE_OTHER): Payer: Self-pay | Admitting: *Deleted

## 2023-12-03 DIAGNOSIS — T63421D Toxic effect of venom of ants, accidental (unintentional), subsequent encounter: Secondary | ICD-10-CM

## 2023-12-03 DIAGNOSIS — Z91038 Other insect allergy status: Secondary | ICD-10-CM

## 2023-12-31 ENCOUNTER — Ambulatory Visit: Admitting: *Deleted

## 2023-12-31 DIAGNOSIS — T63421D Toxic effect of venom of ants, accidental (unintentional), subsequent encounter: Secondary | ICD-10-CM | POA: Diagnosis not present

## 2023-12-31 DIAGNOSIS — Z91038 Other insect allergy status: Secondary | ICD-10-CM

## 2024-01-01 ENCOUNTER — Telehealth: Payer: Self-pay | Admitting: Allergy and Immunology

## 2024-01-01 NOTE — Telephone Encounter (Signed)
 Patient called Megan Weber to order her venom extract. Megan Weber is needing us  to send injection records showing that we have been administering and the address where they need them mailed.   Please fax to: [phone] (AttnBETHA Knee)

## 2024-01-10 ENCOUNTER — Encounter: Payer: Self-pay | Admitting: *Deleted

## 2024-01-10 NOTE — Telephone Encounter (Signed)
 Request sent to [facility].

## 2024-01-11 ENCOUNTER — Encounter (HOSPITAL_BASED_OUTPATIENT_CLINIC_OR_DEPARTMENT_OTHER): Payer: Self-pay

## 2024-01-11 ENCOUNTER — Other Ambulatory Visit: Payer: Self-pay

## 2024-01-11 ENCOUNTER — Emergency Department (HOSPITAL_BASED_OUTPATIENT_CLINIC_OR_DEPARTMENT_OTHER)

## 2024-01-11 ENCOUNTER — Inpatient Hospital Stay (HOSPITAL_BASED_OUTPATIENT_CLINIC_OR_DEPARTMENT_OTHER)
Admission: EM | Admit: 2024-01-11 | Discharge: 2024-01-18 | DRG: 871 | Disposition: A | Discharge status: Home / Self Care | Attending: Internal Medicine | Admitting: Internal Medicine

## 2024-01-11 DIAGNOSIS — Z833 Family history of diabetes mellitus: Secondary | ICD-10-CM

## 2024-01-11 DIAGNOSIS — Z79899 Other long term (current) drug therapy: Secondary | ICD-10-CM

## 2024-01-11 DIAGNOSIS — J180 Bronchopneumonia, unspecified organism: Secondary | ICD-10-CM | POA: Diagnosis present

## 2024-01-11 DIAGNOSIS — G629 Polyneuropathy, unspecified: Secondary | ICD-10-CM | POA: Diagnosis present

## 2024-01-11 DIAGNOSIS — R6520 Severe sepsis without septic shock: Secondary | ICD-10-CM | POA: Diagnosis present

## 2024-01-11 DIAGNOSIS — N179 Acute kidney failure, unspecified: Secondary | ICD-10-CM | POA: Diagnosis present

## 2024-01-11 DIAGNOSIS — E1110 Type 2 diabetes mellitus with ketoacidosis without coma: Secondary | ICD-10-CM | POA: Diagnosis present

## 2024-01-11 DIAGNOSIS — I110 Hypertensive heart disease with heart failure: Secondary | ICD-10-CM | POA: Diagnosis present

## 2024-01-11 DIAGNOSIS — A419 Sepsis, unspecified organism: Secondary | ICD-10-CM | POA: Diagnosis present

## 2024-01-11 DIAGNOSIS — K859 Acute pancreatitis without necrosis or infection, unspecified: Secondary | ICD-10-CM | POA: Diagnosis not present

## 2024-01-11 DIAGNOSIS — F429 Obsessive-compulsive disorder, unspecified: Secondary | ICD-10-CM | POA: Diagnosis present

## 2024-01-11 DIAGNOSIS — E1142 Type 2 diabetes mellitus with diabetic polyneuropathy: Secondary | ICD-10-CM | POA: Diagnosis present

## 2024-01-11 DIAGNOSIS — J181 Lobar pneumonia, unspecified organism: Secondary | ICD-10-CM | POA: Diagnosis not present

## 2024-01-11 DIAGNOSIS — D4702 Systemic mastocytosis: Secondary | ICD-10-CM | POA: Diagnosis present

## 2024-01-11 DIAGNOSIS — G4733 Obstructive sleep apnea (adult) (pediatric): Secondary | ICD-10-CM | POA: Diagnosis present

## 2024-01-11 DIAGNOSIS — Z794 Long term (current) use of insulin: Secondary | ICD-10-CM

## 2024-01-11 DIAGNOSIS — J45909 Unspecified asthma, uncomplicated: Secondary | ICD-10-CM | POA: Diagnosis present

## 2024-01-11 DIAGNOSIS — D801 Nonfamilial hypogammaglobulinemia: Secondary | ICD-10-CM | POA: Diagnosis present

## 2024-01-11 DIAGNOSIS — E039 Hypothyroidism, unspecified: Secondary | ICD-10-CM | POA: Diagnosis present

## 2024-01-11 DIAGNOSIS — Z9081 Acquired absence of spleen: Secondary | ICD-10-CM

## 2024-01-11 DIAGNOSIS — Z7985 Long-term (current) use of injectable non-insulin antidiabetic drugs: Secondary | ICD-10-CM

## 2024-01-11 DIAGNOSIS — Z6831 Body mass index (BMI) 31.0-31.9, adult: Secondary | ICD-10-CM

## 2024-01-11 DIAGNOSIS — E119 Type 2 diabetes mellitus without complications: Secondary | ICD-10-CM | POA: Diagnosis not present

## 2024-01-11 DIAGNOSIS — G3101 Pick's disease: Secondary | ICD-10-CM | POA: Diagnosis present

## 2024-01-11 DIAGNOSIS — I700 Atherosclerosis of aorta: Secondary | ICD-10-CM | POA: Diagnosis present

## 2024-01-11 DIAGNOSIS — E876 Hypokalemia: Secondary | ICD-10-CM | POA: Diagnosis present

## 2024-01-11 DIAGNOSIS — I10 Essential (primary) hypertension: Secondary | ICD-10-CM | POA: Diagnosis not present

## 2024-01-11 DIAGNOSIS — K760 Fatty (change of) liver, not elsewhere classified: Secondary | ICD-10-CM | POA: Diagnosis present

## 2024-01-11 DIAGNOSIS — Z7984 Long term (current) use of oral hypoglycemic drugs: Secondary | ICD-10-CM

## 2024-01-11 DIAGNOSIS — I2699 Other pulmonary embolism without acute cor pulmonale: Secondary | ICD-10-CM | POA: Diagnosis present

## 2024-01-11 DIAGNOSIS — F329 Major depressive disorder, single episode, unspecified: Secondary | ICD-10-CM | POA: Diagnosis present

## 2024-01-11 DIAGNOSIS — K8500 Idiopathic acute pancreatitis without necrosis or infection: Secondary | ICD-10-CM | POA: Diagnosis present

## 2024-01-11 DIAGNOSIS — Z7901 Long term (current) use of anticoagulants: Secondary | ICD-10-CM | POA: Diagnosis not present

## 2024-01-11 DIAGNOSIS — K529 Noninfective gastroenteritis and colitis, unspecified: Secondary | ICD-10-CM | POA: Diagnosis present

## 2024-01-11 DIAGNOSIS — E1165 Type 2 diabetes mellitus with hyperglycemia: Secondary | ICD-10-CM | POA: Diagnosis not present

## 2024-01-11 DIAGNOSIS — D75839 Thrombocytosis, unspecified: Secondary | ICD-10-CM | POA: Diagnosis not present

## 2024-01-11 DIAGNOSIS — J90 Pleural effusion, not elsewhere classified: Secondary | ICD-10-CM | POA: Diagnosis not present

## 2024-01-11 DIAGNOSIS — I5032 Chronic diastolic (congestive) heart failure: Secondary | ICD-10-CM | POA: Diagnosis present

## 2024-01-11 DIAGNOSIS — J189 Pneumonia, unspecified organism: Secondary | ICD-10-CM | POA: Diagnosis present

## 2024-01-11 DIAGNOSIS — J9811 Atelectasis: Secondary | ICD-10-CM | POA: Diagnosis present

## 2024-01-11 DIAGNOSIS — K219 Gastro-esophageal reflux disease without esophagitis: Secondary | ICD-10-CM | POA: Diagnosis present

## 2024-01-11 DIAGNOSIS — Z91199 Patient's noncompliance with other medical treatment and regimen due to unspecified reason: Secondary | ICD-10-CM

## 2024-01-11 DIAGNOSIS — I671 Cerebral aneurysm, nonruptured: Secondary | ICD-10-CM | POA: Diagnosis present

## 2024-01-11 DIAGNOSIS — M7989 Other specified soft tissue disorders: Secondary | ICD-10-CM | POA: Diagnosis not present

## 2024-01-11 DIAGNOSIS — F411 Generalized anxiety disorder: Secondary | ICD-10-CM | POA: Diagnosis present

## 2024-01-11 DIAGNOSIS — Z86718 Personal history of other venous thrombosis and embolism: Secondary | ICD-10-CM

## 2024-01-11 DIAGNOSIS — E75241 Niemann-Pick disease type B: Secondary | ICD-10-CM | POA: Diagnosis present

## 2024-01-11 DIAGNOSIS — D809 Immunodeficiency with predominantly antibody defects, unspecified: Secondary | ICD-10-CM | POA: Diagnosis not present

## 2024-01-11 LAB — GLUCOSE, CAPILLARY
Glucose-Capillary: 247 mg/dL — ABNORMAL HIGH (ref 70–99)
Glucose-Capillary: 241 mg/dL — ABNORMAL HIGH (ref 70–99)
Glucose-Capillary: 245 mg/dL — ABNORMAL HIGH (ref 70–99)

## 2024-01-11 LAB — CBG MONITORING, ED
Glucose-Capillary: 299 mg/dL — ABNORMAL HIGH (ref 70–99)
Glucose-Capillary: 254 mg/dL — ABNORMAL HIGH (ref 70–99)
Glucose-Capillary: 231 mg/dL — ABNORMAL HIGH (ref 70–99)
Glucose-Capillary: 121 mg/dL — ABNORMAL HIGH (ref 70–99)

## 2024-01-11 LAB — COMPREHENSIVE METABOLIC PANEL WITH GFR
ALT: 18 U/L (ref 0–44)
ALT: 16 U/L (ref 0–44)
AST: 36 U/L (ref 15–41)
AST: 34 U/L (ref 15–41)
Albumin: 3.7 g/dL (ref 3.5–5.0)
Albumin: 3.3 g/dL — ABNORMAL LOW (ref 3.5–5.0)
Alkaline Phosphatase: 157 U/L — ABNORMAL HIGH (ref 38–126)
Alkaline Phosphatase: 155 U/L — ABNORMAL HIGH (ref 38–126)
Anion gap: 20 — ABNORMAL HIGH (ref 5–15)
Anion gap: 16 — ABNORMAL HIGH (ref 5–15)
BUN: 16 mg/dL (ref 6–20)
BUN: 9 mg/dL (ref 6–20)
CO2: 16 mmol/L — ABNORMAL LOW (ref 22–32)
CO2: 16 mmol/L — ABNORMAL LOW (ref 22–32)
Calcium: 9.1 mg/dL (ref 8.9–10.3)
Calcium: 8.8 mg/dL — ABNORMAL LOW (ref 8.9–10.3)
Chloride: 92 mmol/L — ABNORMAL LOW (ref 98–111)
Chloride: 103 mmol/L (ref 98–111)
Creatinine, Ser: 1.28 mg/dL — ABNORMAL HIGH (ref 0.44–1.00)
Creatinine, Ser: 0.89 mg/dL (ref 0.44–1.00)
GFR, Estimated: 52 mL/min — ABNORMAL LOW (ref >60)
GFR, Estimated: >60 mL/min (ref >60)
Glucose, Bld: 505 mg/dL (ref 70–99)
Glucose, Bld: 292 mg/dL — ABNORMAL HIGH (ref 70–99)
Potassium: 3.3 mmol/L — ABNORMAL LOW (ref 3.5–5.1)
Potassium: 3.4 mmol/L — ABNORMAL LOW (ref 3.5–5.1)
Sodium: 128 mmol/L — ABNORMAL LOW (ref 135–145)
Sodium: 135 mmol/L (ref 135–145)
Total Bilirubin: 0.7 mg/dL (ref 0.0–1.2)
Total Bilirubin: 0.8 mg/dL (ref 0.0–1.2)
Total Protein: 7.2 g/dL (ref 6.5–8.1)
Total Protein: 6.5 g/dL (ref 6.5–8.1)

## 2024-01-11 LAB — STREP PNEUMONIAE URINARY ANTIGEN: Strep Pneumo Urinary Antigen: NEGATIVE

## 2024-01-11 LAB — I-STAT VENOUS BLOOD GAS, ED
Acid-base deficit: 6 mmol/L — ABNORMAL HIGH (ref 0.0–2.0)
Bicarbonate: 18.8 mmol/L — ABNORMAL LOW (ref 20.0–28.0)
Calcium, Ion: 1.08 mmol/L — ABNORMAL LOW (ref 1.15–1.40)
HCT: 38 % (ref 36.0–46.0)
Hemoglobin: 12.9 g/dL (ref 12.0–15.0)
O2 Saturation: 50 %
Patient temperature: 97.5
Potassium: 3.4 mmol/L — ABNORMAL LOW (ref 3.5–5.1)
Sodium: 133 mmol/L — ABNORMAL LOW (ref 135–145)
TCO2: 20 mmol/L — ABNORMAL LOW (ref 22–32)
pCO2, Ven: 33.6 mmHg — ABNORMAL LOW (ref 44–60)
pH, Ven: 7.352 (ref 7.25–7.43)
pO2, Ven: 27 mmHg — CL (ref 32–45)

## 2024-01-11 LAB — CBC WITH DIFFERENTIAL/PLATELET
Abs Immature Granulocytes: 0.13 K/uL — ABNORMAL HIGH (ref 0.00–0.07)
Basophils Absolute: 0.1 K/uL (ref 0.0–0.1)
Basophils Relative: 0 %
Eosinophils Absolute: 0.1 K/uL (ref 0.0–0.5)
Eosinophils Relative: 0 %
HCT: 37.3 % (ref 36.0–46.0)
Hemoglobin: 12.8 g/dL (ref 12.0–15.0)
Immature Granulocytes: 1 %
Lymphocytes Relative: 15 %
Lymphs Abs: 3.1 K/uL (ref 0.7–4.0)
MCH: 29.5 pg (ref 26.0–34.0)
MCHC: 34.3 g/dL (ref 30.0–36.0)
MCV: 85.9 fL (ref 80.0–100.0)
Monocytes Absolute: 1 K/uL (ref 0.1–1.0)
Monocytes Relative: 5 %
Neutro Abs: 16.7 K/uL — ABNORMAL HIGH (ref 1.7–7.7)
Neutrophils Relative %: 79 %
Platelets: 325 K/uL (ref 150–400)
RBC: 4.34 MIL/uL (ref 3.87–5.11)
RDW: 13.2 % (ref 11.5–15.5)
WBC: 21 K/uL — ABNORMAL HIGH (ref 4.0–10.5)
nRBC: 0 % (ref 0.0–0.2)

## 2024-01-11 LAB — BASIC METABOLIC PANEL WITH GFR
Anion gap: 16 — ABNORMAL HIGH (ref 5–15)
BUN: 13 mg/dL (ref 6–20)
CO2: 17 mmol/L — ABNORMAL LOW (ref 22–32)
Calcium: 7.9 mg/dL — ABNORMAL LOW (ref 8.9–10.3)
Chloride: 103 mmol/L (ref 98–111)
Creatinine, Ser: 0.9 mg/dL (ref 0.44–1.00)
GFR, Estimated: >60 mL/min (ref >60)
Glucose, Bld: 293 mg/dL — ABNORMAL HIGH (ref 70–99)
Potassium: 3.4 mmol/L — ABNORMAL LOW (ref 3.5–5.1)
Sodium: 136 mmol/L (ref 135–145)

## 2024-01-11 LAB — PHOSPHORUS: Phosphorus: 1.9 mg/dL — ABNORMAL LOW (ref 2.5–4.6)

## 2024-01-11 LAB — MAGNESIUM: Magnesium: 1.7 mg/dL (ref 1.7–2.4)

## 2024-01-11 LAB — BLOOD GAS, ARTERIAL
Acid-base deficit: 4.6 mmol/L — ABNORMAL HIGH (ref 0.0–2.0)
Bicarbonate: 18.6 mmol/L — ABNORMAL LOW (ref 20.0–28.0)
Drawn by: 74501
FIO2: 21 %
O2 Saturation: 98.3 %
Patient temperature: 37
pCO2 arterial: 28 mmHg — ABNORMAL LOW (ref 32–48)
pH, Arterial: 7.43 (ref 7.35–7.45)
pO2, Arterial: 81 mmHg — ABNORMAL LOW (ref 83–108)

## 2024-01-11 LAB — URINALYSIS, W/ REFLEX TO CULTURE (INFECTION SUSPECTED)
Bilirubin Urine: NEGATIVE
Glucose, UA: >=500 mg/dL — AB
Ketones, ur: NEGATIVE mg/dL
Leukocytes,Ua: NEGATIVE
Nitrite: NEGATIVE
Protein, ur: NEGATIVE mg/dL
Specific Gravity, Urine: <=1.005 (ref 1.005–1.030)
pH: 5.5 (ref 5.0–8.0)

## 2024-01-11 LAB — DIC (DISSEMINATED INTRAVASCULAR COAGULATION)PANEL
D-Dimer, Quant: 2.38 ug{FEU}/mL — ABNORMAL HIGH (ref 0.00–0.50)
Fibrinogen: 723 mg/dL — ABNORMAL HIGH (ref 210–475)
INR: 2.1 — ABNORMAL HIGH (ref 0.8–1.2)
Platelets: 325 K/uL (ref 150–400)
Prothrombin Time: 24.6 s — ABNORMAL HIGH (ref 11.4–15.2)
Smear Review: NONE SEEN
aPTT: 43 s — ABNORMAL HIGH (ref 24–36)

## 2024-01-11 LAB — LACTIC ACID, PLASMA
Lactic Acid, Venous: 5.1 mmol/L (ref 0.5–1.9)
Lactic Acid, Venous: 3.4 mmol/L (ref 0.5–1.9)
Lactic Acid, Venous: 3 mmol/L (ref 0.5–1.9)
Lactic Acid, Venous: 3.6 mmol/L (ref 0.5–1.9)

## 2024-01-11 LAB — RESP PANEL BY RT-PCR (RSV, FLU A&B, COVID)  RVPGX2
Influenza A by PCR: NEGATIVE
Influenza B by PCR: NEGATIVE
Resp Syncytial Virus by PCR: NEGATIVE
SARS Coronavirus 2 by RT PCR: NEGATIVE

## 2024-01-11 LAB — FERRITIN: Ferritin: 339 ng/mL — ABNORMAL HIGH (ref 11–307)

## 2024-01-11 LAB — LIPASE, BLOOD: Lipase: 151 U/L — ABNORMAL HIGH (ref 11–51)

## 2024-01-11 LAB — TROPONIN T, HIGH SENSITIVITY
Troponin T High Sensitivity: 25 ng/L — ABNORMAL HIGH (ref 0–19)
Troponin T High Sensitivity: 22 ng/L — ABNORMAL HIGH (ref 0–19)

## 2024-01-11 LAB — HEMOGLOBIN A1C
Hgb A1c MFr Bld: 10.6 % — ABNORMAL HIGH (ref 4.8–5.6)
Mean Plasma Glucose: 257.52 mg/dL

## 2024-01-11 LAB — BETA-HYDROXYBUTYRIC ACID: Beta-Hydroxybutyric Acid: 0.1 mmol/L (ref 0.05–0.27)

## 2024-01-11 LAB — HEPARIN LEVEL (UNFRACTIONATED): Heparin Unfractionated: >1.1 [IU]/mL — ABNORMAL HIGH (ref 0.30–0.70)

## 2024-01-11 LAB — PROCALCITONIN: Procalcitonin: 0.36 ng/mL

## 2024-01-11 LAB — MRSA NEXT GEN BY PCR, NASAL: MRSA by PCR Next Gen: NOT DETECTED

## 2024-01-11 LAB — PREGNANCY, URINE: Preg Test, Ur: NEGATIVE

## 2024-01-11 LAB — APTT: aPTT: 48 s — ABNORMAL HIGH (ref 24–36)

## 2024-01-11 MED ORDER — ORAL CARE MOUTH RINSE: 15.0000 mL | OROMUCOSAL | Status: DC | PRN

## 2024-01-11 MED ORDER — LACTATED RINGERS IV SOLN: INTRAVENOUS | Status: DC

## 2024-01-11 MED ORDER — SODIUM CHLORIDE 0.9 % IV SOLN
2.0000 g | Freq: Once | INTRAVENOUS | Status: AC
  Administered 2024-01-11: 2 g via INTRAVENOUS

## 2024-01-11 MED ORDER — INSULIN REGULAR(HUMAN) IN NACL 100-0.9 UT/100ML-% IV SOLN
INTRAVENOUS | Status: DC
  Administered 2024-01-11: 9 [IU]/h via INTRAVENOUS
  Filled 2024-01-11: qty 100

## 2024-01-11 MED ORDER — METRONIDAZOLE 500 MG/100ML IV SOLN
500.0000 mg | Freq: Once | INTRAVENOUS | Status: AC
  Administered 2024-01-11: 500 mg via INTRAVENOUS
  Filled 2024-01-11: qty 100

## 2024-01-11 MED ORDER — LACTATED RINGERS IV BOLUS
1000.0000 mL | Freq: Once | INTRAVENOUS | Status: AC
  Administered 2024-01-11: 1000 mL via INTRAVENOUS

## 2024-01-11 MED ORDER — INSULIN ASPART 100 UNIT/ML IJ SOLN
0.0000 [IU] | INTRAMUSCULAR | Status: DC
  Administered 2024-01-11: 2 [IU] via SUBCUTANEOUS
  Filled 2024-01-11: qty 2

## 2024-01-11 MED ORDER — IOHEXOL 300 MG/ML  SOLN
100.0000 mL | Freq: Once | INTRAMUSCULAR | Status: AC | PRN
  Administered 2024-01-11: 100 mL via INTRAVENOUS

## 2024-01-11 MED ORDER — SODIUM CHLORIDE 0.9 % IV BOLUS
1000.0000 mL | Freq: Once | INTRAVENOUS | Status: AC
  Administered 2024-01-11: 1000 mL via INTRAVENOUS

## 2024-01-11 MED ORDER — DEXTROSE IN LACTATED RINGERS 5 % IV SOLN: INTRAVENOUS | Status: AC

## 2024-01-11 MED ORDER — CHLORHEXIDINE GLUCONATE CLOTH 2 % EX PADS
6.0000 | MEDICATED_PAD | Freq: Every day | CUTANEOUS | Status: DC
  Administered 2024-01-11 – 2024-01-18 (×6): 6 via TOPICAL
  Filled 2024-01-11: qty 6

## 2024-01-11 MED ORDER — VANCOMYCIN HCL 1500 MG/300ML IV SOLN
1500.0000 mg | INTRAVENOUS | Status: DC
  Filled 2024-01-11: qty 300

## 2024-01-11 MED ORDER — ONDANSETRON 4 MG PO TBDP: 8.0000 mg | ORAL_TABLET | Freq: Three times a day (TID) | ORAL | Status: DC | PRN

## 2024-01-11 MED ORDER — DICYCLOMINE HCL 10 MG PO CAPS: 10.0000 mg | ORAL_CAPSULE | Freq: Three times a day (TID) | ORAL | Status: DC

## 2024-01-11 MED ORDER — CLONAZEPAM 0.5 MG PO TABS
0.5000 mg | ORAL_TABLET | Freq: Once | ORAL | Status: AC
  Administered 2024-01-11: 0.5 mg via ORAL
  Filled 2024-01-11: qty 1

## 2024-01-11 MED ORDER — ONDANSETRON HCL 4 MG PO TABS: 4.0000 mg | ORAL_TABLET | Freq: Four times a day (QID) | ORAL | Status: DC | PRN

## 2024-01-11 MED ORDER — LABETALOL HCL 5 MG/ML IV SOLN
10.0000 mg | INTRAVENOUS | Status: DC | PRN
  Administered 2024-01-12 – 2024-01-16 (×8): 10 mg via INTRAVENOUS
  Filled 2024-01-11 (×9): qty 4

## 2024-01-11 MED ORDER — HYDROMORPHONE HCL 1 MG/ML IJ SOLN
1.0000 mg | Freq: Once | INTRAMUSCULAR | Status: AC
  Administered 2024-01-11: 1 mg via INTRAVENOUS
  Filled 2024-01-11: qty 1

## 2024-01-11 MED ORDER — SODIUM CHLORIDE 0.9 % IV BOLUS (SEPSIS)
1000.0000 mL | Freq: Once | INTRAVENOUS | Status: AC
  Administered 2024-01-11: 1000 mL via INTRAVENOUS

## 2024-01-11 MED ORDER — ACETAMINOPHEN 325 MG PO TABS
650.0000 mg | ORAL_TABLET | Freq: Once | ORAL | Status: AC | PRN
  Administered 2024-01-11: 650 mg via ORAL
  Filled 2024-01-11: qty 2

## 2024-01-11 MED ORDER — METRONIDAZOLE 500 MG/100ML IV SOLN
500.0000 mg | Freq: Two times a day (BID) | INTRAVENOUS | Status: DC
  Administered 2024-01-11: 500 mg via INTRAVENOUS
  Filled 2024-01-11: qty 100

## 2024-01-11 MED ORDER — POTASSIUM CHLORIDE 10 MEQ/100ML IV SOLN: 10.0000 meq | INTRAVENOUS | Status: DC

## 2024-01-11 MED ORDER — ONDANSETRON HCL 4 MG/2ML IJ SOLN
4.0000 mg | Freq: Once | INTRAMUSCULAR | Status: AC
  Administered 2024-01-11: 4 mg via INTRAVENOUS
  Filled 2024-01-11: qty 2

## 2024-01-11 MED ORDER — ORAL CARE MOUTH RINSE
15.0000 mL | OROMUCOSAL | Status: DC | PRN
  Filled 2024-01-11: qty 15

## 2024-01-11 MED ORDER — LORAZEPAM 2 MG/ML IJ SOLN
0.5000 mg | Freq: Once | INTRAMUSCULAR | Status: AC
  Administered 2024-01-11: 0.5 mg via INTRAVENOUS
  Filled 2024-01-11: qty 1

## 2024-01-11 MED ORDER — DEXTROSE 50 % IV SOLN: 0.0000 mL | INTRAVENOUS | Status: DC | PRN

## 2024-01-11 MED ORDER — IPRATROPIUM BROMIDE 0.02 % IN SOLN
0.5000 mg | Freq: Four times a day (QID) | RESPIRATORY_TRACT | Status: DC
  Administered 2024-01-11 – 2024-01-14 (×11): 0.5 mg via RESPIRATORY_TRACT
  Filled 2024-01-11 (×11): qty 2.5

## 2024-01-11 MED ORDER — METOPROLOL TARTRATE 12.5 MG HALF TABLET
12.5000 mg | ORAL_TABLET | Freq: Two times a day (BID) | ORAL | Status: DC
  Administered 2024-01-11 – 2024-01-12 (×2): 12.5 mg via ORAL
  Filled 2024-01-11 (×3): qty 1

## 2024-01-11 MED ORDER — INSULIN REGULAR(HUMAN) IN NACL 100-0.9 UT/100ML-% IV SOLN
INTRAVENOUS | Status: AC
  Administered 2024-01-11: 10 [IU]/h via INTRAVENOUS
  Filled 2024-01-11: qty 100

## 2024-01-11 MED ORDER — POTASSIUM CHLORIDE CRYS ER 20 MEQ PO TBCR
40.0000 meq | EXTENDED_RELEASE_TABLET | Freq: Once | ORAL | Status: AC
  Administered 2024-01-11: 40 meq via ORAL
  Filled 2024-01-11: qty 2

## 2024-01-11 MED ORDER — HYDROMORPHONE HCL 1 MG/ML IJ SOLN
0.5000 mg | INTRAMUSCULAR | Status: DC | PRN
  Administered 2024-01-11 – 2024-01-14 (×8): 0.5 mg via INTRAVENOUS
  Filled 2024-01-11 (×9): qty 1

## 2024-01-11 MED ORDER — PANTOPRAZOLE SODIUM 40 MG PO TBEC
40.0000 mg | DELAYED_RELEASE_TABLET | Freq: Every day | ORAL | Status: DC
  Administered 2024-01-11 – 2024-01-17 (×6): 40 mg via ORAL
  Filled 2024-01-11 (×7): qty 1

## 2024-01-11 MED ORDER — SODIUM CHLORIDE 0.9 % IV SOLN: INTRAVENOUS | Status: DC

## 2024-01-11 MED ORDER — HEPARIN (PORCINE) 25000 UT/250ML-% IV SOLN
1650.0000 [IU]/h | INTRAVENOUS | Status: DC
  Administered 2024-01-11: 1100 [IU]/h via INTRAVENOUS
  Administered 2024-01-12: 1250 [IU]/h via INTRAVENOUS
  Administered 2024-01-13: 1350 [IU]/h via INTRAVENOUS
  Administered 2024-01-14: 21:00:00 1500 [IU]/h via INTRAVENOUS
  Administered 2024-01-14: 03:00:00 1350 [IU]/h via INTRAVENOUS
  Administered 2024-01-15 – 2024-01-17 (×3): 1500 [IU]/h via INTRAVENOUS
  Filled 2024-01-11 (×9): qty 250

## 2024-01-11 MED ORDER — FAMOTIDINE IN NACL 20-0.9 MG/50ML-% IV SOLN
20.0000 mg | Freq: Two times a day (BID) | INTRAVENOUS | Status: DC
  Administered 2024-01-11 – 2024-01-18 (×14): 20 mg via INTRAVENOUS
  Filled 2024-01-11 (×14): qty 50

## 2024-01-11 MED ORDER — LISINOPRIL 2.5 MG PO TABS
2.5000 mg | ORAL_TABLET | Freq: Every day | ORAL | Status: DC
  Administered 2024-01-11 – 2024-01-12 (×2): 2.5 mg via ORAL
  Filled 2024-01-11 (×2): qty 1

## 2024-01-11 MED ORDER — ONDANSETRON HCL 4 MG/2ML IJ SOLN
4.0000 mg | Freq: Four times a day (QID) | INTRAMUSCULAR | Status: DC | PRN
  Administered 2024-01-11 – 2024-01-18 (×8): 4 mg via INTRAVENOUS
  Filled 2024-01-11 (×9): qty 2

## 2024-01-11 MED ORDER — VANCOMYCIN HCL IN DEXTROSE 1-5 GM/200ML-% IV SOLN
1000.0000 mg | Freq: Once | INTRAVENOUS | Status: AC
  Administered 2024-01-11: 1000 mg via INTRAVENOUS
  Filled 2024-01-11: qty 200

## 2024-01-11 MED ORDER — HEPARIN BOLUS VIA INFUSION
4000.0000 [IU] | Freq: Once | INTRAVENOUS | Status: AC
  Administered 2024-01-11: 4000 [IU] via INTRAVENOUS

## 2024-01-11 MED ORDER — SODIUM CHLORIDE 0.9 % IV SOLN
2.0000 g | Freq: Three times a day (TID) | INTRAVENOUS | Status: DC
  Administered 2024-01-11 – 2024-01-13 (×5): 2 g via INTRAVENOUS
  Filled 2024-01-11 (×5): qty 10

## 2024-01-11 MED ORDER — DESIPRAMINE HCL 10 MG PO TABS: 10.0000 mg | ORAL_TABLET | Freq: Every day | ORAL | Status: DC

## 2024-01-11 MED ORDER — POTASSIUM CHLORIDE 10 MEQ/50ML IV SOLN
10.0000 meq | INTRAVENOUS | Status: AC
  Administered 2024-01-12 (×4): 10 meq via INTRAVENOUS
  Filled 2024-01-11 (×4): qty 50

## 2024-01-11 MED ORDER — ACETAMINOPHEN 325 MG PO TABS
650.0000 mg | ORAL_TABLET | Freq: Once | ORAL | Status: DC | PRN
  Administered 2024-01-17: 08:00:00 650 mg via ORAL
  Filled 2024-01-11: qty 2

## 2024-01-11 MED ADMIN — Heparin Sodium (Porcine) Inj 5000 Unit/ML: 1000 [IU] | INTRAVENOUS | NDC 63323051701

## 2024-01-11 MED FILL — Heparin Sodium (Porcine) Inj 5000 Unit/ML: 1000.0000 [IU] | INTRAVENOUS | Qty: 1000 | Status: AC

## 2024-01-11 NOTE — Progress Notes (Signed)
 PHARMACY - ANTICOAGULATION CONSULT NOTE  Pharmacy Consult for heparin Indication: pulmonary embolus  Allergies[1]  Patient Measurements: Height: 5' 1 (154.9 cm) Weight: 75.3 kg (166 lb) IBW/kg (Calculated) : 47.8 HEPARIN DW (KG): 64.4  Vital Signs: Temp: 97.5 F (36.4 C) (12/12 1214) Temp Source: Oral (12/12 1214) BP: 148/87 (12/12 1200) Pulse Rate: 120 (12/12 1200)  Labs: Recent Labs    01/11/24 1054 01/11/24 1221  HGB 12.8 12.9  HCT 37.3 38.0  PLT 325  --   CREATININE 1.28*  --     Estimated Creatinine Clearance: 51 mL/min (A) (by C-G formula based on SCr of 1.28 mg/dL (H)).   Medical History: Past Medical History:  Diagnosis Date   Abnormal glucose    Adjustment disorder    Asplenia    Cerebral aneurysm    Cerebral degeneration associated with generalized lipidosis    Depression    Diabetes mellitus without complication (HCC)    Hypertension    OCD (obsessive compulsive disorder)    Peripheral neuropathy     Medications:  Infusions:   sodium chloride      heparin     metronidazole  500 mg (01/11/24 1311)   vancomycin       Assessment: 48 yof presented to the ED with CP and vomiting. Found to have bilateral PE's and possible DVT. To start IV heparin. Pt is on chronic low dose xarelto 10mg . Baseline CBC is ok. Will proceed with heparin bolus since PTA xarelto is not a therapeutic dose. Will monitor with aPTT's initially since the xarelto may impact the heparin level.   Goal of Therapy:  Heparin level 0.3-0.7 units/ml aPTT 66-103 seconds Monitor platelets by anticoagulation protocol: Yes   Plan:  Heparin bolus 4000 units IV x 1 Heparin gtt 1100 units/hr Check a 6 hr heparin level and aPTT Daily heparin level, aPTT, and CBC  Timera Windt, Vernell Helling 01/11/2024,1:16 PM      [1]  Allergies Allergen Reactions   Ceftin [Cefuroxime Axetil] Shortness Of Breath and Swelling   Cinobac [Cinoxacin] Shortness Of Breath and Swelling   Fire Ant  Anaphylaxis   Sulfa Antibiotics Other (See Comments)    Unknown

## 2024-01-11 NOTE — Progress Notes (Signed)
 Pharmacy Antibiotic Note  Megan Weber is a 48 y.o. female admitted on 01/11/2024 with pneumonia.  Pharmacy has been consulted for vancomycin  dosing.  Patient given one-time doses of aztreonam , Flagyl , and vancomycin  earlier today.  Plan: - Start vancomycin  1500mg  IV q24h (eAUC 518 using Scr 0.9, Vd 0.72, IBW) - Vanc levels PRN - Monitor cultures, renal function, and overall clinical status - De-escalate abx as able   Height: 5' 1 (154.9 cm) Weight: 75.3 kg (166 lb) IBW/kg (Calculated) : 47.8  Temp (24hrs), Avg:98.9 F (37.2 C), Min:97.5 F (36.4 C), Max:101.4 F (38.6 C)  Recent Labs  Lab 01/11/24 1054 01/11/24 1342 01/11/24 1359  WBC 21.0*  --   --   CREATININE 1.28*  --  0.90  LATICACIDVEN 5.1* 3.4*  --     Estimated Creatinine Clearance: 72.5 mL/min (by C-G formula based on SCr of 0.9 mg/dL).    Allergies[1]  Antimicrobials this admission: 12/12 vanc >>  12/12 aztreonam  >>  12/12 Flagyl  >>   Dose adjustments this admission: N/A  Microbiology results: 12/12 BCx: ip 12/12 Sputum: not yet collected  12/12 MRSA PCR: collected  Thank you for allowing pharmacy to be a part of this patients care.  Lacinda Moats, PharmD Clinical Pharmacist  12/12/20258:57 PM    [1]  Allergies Allergen Reactions   Ceftin [Cefuroxime Axetil] Shortness Of Breath and Swelling   Cinobac [Cinoxacin] Shortness Of Breath and Swelling   Fire Ant Anaphylaxis   Sulfa Antibiotics Other (See Comments)    Unknown

## 2024-01-11 NOTE — Consult Note (Signed)
° °  PCCM transfer request    Sending physician: Dr Yolande  Sending facility: [facility]  Reason for transfer: Hospital admission   Brief case summary: 48 year old female presenting with persistent nausea and vomiting, history of PEs, found to have acute bilateral pulmonary embolisms, streaky infiltrates concerning for multilobar pneumonia, pancreatitis, hyperglycemia and acute kidney injury.  Recommendations made prior to transfer: Aggressive fluid resuscitation with lactated Ringer's.  Cover pneumonia with broad-spectrum antibiotics.  Hemodynamically stable at this time, appropriate for hospital admission to the hospital ward.  No ICU indications at this time.  Advised to reach out to hospital medicine team  Transfer accepted: No    Zola LOISE Herter 01/11/2024 3:24 PM [facility]  For contact information, see Amion. If no response to pager, please call PCCM consult pager. After hours, 7PM- 7AM, please call Elink.

## 2024-01-11 NOTE — ED Notes (Signed)
 Per provider who talked to hospitialist and CCM, DC the insulin and continue the D5LR.

## 2024-01-11 NOTE — ED Triage Notes (Addendum)
 Sent by PCP. Pt reports she was swabbed for Covid at office. Vomiting x 3 days. Complaining of chest tightness and pain with coughing since tuesday

## 2024-01-11 NOTE — ED Notes (Signed)
 LR infusion held due to pt receiving D5LR infusion at present.

## 2024-01-11 NOTE — Progress Notes (Signed)
 PHARMACY - ANTICOAGULATION CONSULT NOTE  Pharmacy Consult for heparin Indication: pulmonary embolus  Allergies[1]  Patient Measurements: Height: 5' 1 (154.9 cm) Weight: 75.3 kg (166 lb) IBW/kg (Calculated) : 47.8 HEPARIN DW (KG): 64.4  Vital Signs: Temp: 99.1 F (37.3 C) (12/12 2025) Temp Source: Oral (12/12 2025) BP: 199/92 (12/12 2200) Pulse Rate: 143 (12/12 2200)  Labs: Recent Labs    01/11/24 1054 01/11/24 1221 01/11/24 1359 01/11/24 2109 01/11/24 2232  HGB 12.8 12.9  --   --   --   HCT 37.3 38.0  --   --   --   PLT 325  325  --   --   --   --   APTT 43*  --   --   --  48*  LABPROT 24.6*  --   --   --   --   INR 2.1*  --   --   --   --   HEPARINUNFRC  --   --   --   --  >1.10*  CREATININE 1.28*  --  0.90 0.89  --     Estimated Creatinine Clearance: 73.3 mL/min (by C-G formula based on SCr of 0.89 mg/dL).   Medical History: Past Medical History:  Diagnosis Date   Abnormal glucose    Adjustment disorder    Asplenia    Cerebral aneurysm    Cerebral degeneration associated with generalized lipidosis    Depression    Diabetes mellitus without complication (HCC)    Hypertension    OCD (obsessive compulsive disorder)    Peripheral neuropathy     Medications:  Infusions:   aztreonam  Stopped (01/11/24 2211)   dextrose  5% lactated ringers Stopped (01/11/24 2116)   famotidine  (PEPCID ) IV Stopped (01/11/24 2251)   heparin 1,100 Units/hr (01/11/24 2259)   insulin     [START ON 01/12/2024] metronidazole      potassium chloride     [START ON 01/12/2024] vancomycin       Assessment: 48 yof presented to the ED with CP and vomiting. Found to have bilateral PE's and possible DVT. To start IV heparin. Pt is on chronic low dose xarelto 10mg . Baseline CBC is ok. Will proceed with heparin bolus since PTA xarelto is not a therapeutic dose. Will monitor with aPTT's initially since the xarelto may impact the heparin level.   aPTT 48  subtherapeutic on 1100  units/hr Heparin level elevated as expected with recent xarelto No bleeding per RN  Goal of Therapy:  Heparin level 0.3-0.7 units/ml aPTT 66-103 seconds Monitor platelets by anticoagulation protocol: Yes   Plan:  Heparin bolus 4000 units IV x 1 Heparin gtt 1100 units/hr Check a 6 hr heparin level and aPTT Daily heparin level, aPTT, and CBC  Leeroy Mace RPh 01/11/2024, 11:22 PM       [1]  Allergies Allergen Reactions   Ceftin [Cefuroxime Axetil] Shortness Of Breath and Swelling   Cinobac [Cinoxacin] Shortness Of Breath and Swelling   Fire Ant Anaphylaxis   Sulfa Antibiotics Other (See Comments)    Unknown

## 2024-01-11 NOTE — Sepsis Progress Note (Signed)
 eLink is following this Code Sepsis.

## 2024-01-11 NOTE — ED Notes (Signed)
 PA aware of K+ 3.3. Oral potassium and Repeat BMP ordered

## 2024-01-11 NOTE — Plan of Care (Signed)
 Hospital Medicine Transfer Accept Note Patient Name/Age: Megan Weber / 48 y.o. MRN: [id number] Admission Date: 01/11/2024  Once successfully transferred to the appropriate floor, TRH will assume care for the patient above.  A/P: 23F h/o systemic mastocytosis, type B Niemann-Pick disease on Xenpozyme infusion, brain aneurysm, bilateral PE/DVT on Xarelto, chronic nausea, diarrhea, PTSD, s/p splenectomy, hepatic artery stenosis, and depression who p/w f/c and body aches for 3 days and found to have sepsis 2/2 multifocal PNA, acute pancreatitis, and acute enteritis c/b acute bilateral PEs. Pt reports having n/v for which she took Compazine  and was evaluated by her PCP today who advised her to come to the ED. Of note, pt reports poor compliance with Xarelto.   OSH EDP started hep gtt, and broad spectrum abx (IV vanc/metronidazole  and aztreonam ) and requested CCM evaluation who deferred to medicine admission, and will consult on pt upon arrival.   Pt is severely ill, and will need immediate CCM consult on arrival. Consider vanc/zosyn on arrival, hep gtt for PE, and IVF. Pt will need palliative care consult as well.  Marsha Ada, MD Attending Physician Division of [facility] January 11, 2024 4:21 PM

## 2024-01-11 NOTE — ED Provider Notes (Addendum)
 [place] [facility] AT [facility] Provider Note   CSN: [id number] Arrival date & time: 01/11/24  9045     Patient presents with: Chest Pain and Fever   Megan Weber is a 48 y.o. female.   48 year old female presents with complaint of fevers, chills, body aches, left-sided flank pain.  Symptoms started on Tuesday (3 days ago).  States that she vomited about 60 times between Tuesday and Wednesday.  She took her Compazine  on Wednesday and her vomiting stopped.  Patient has been tolerating Coca-[employer]a since that time.  Patient felt poorly today and went to go see her PCP who advised her to come to the emergency room.  Reports also having a cough.  No known sick contacts.  Patient is on immune therapy for immune deficiency, notes that she also has blood cancer which she is otherwise not currently being treated for and is awaiting to go into a trial.  She is also asplenic.  Additional complex history as reviewed in chart.  Later adds she has not been compliant with her Xarelto  recently.        Prior to Admission medications  Medication Sig Start Date End Date Taking? Authorizing Provider  atorvastatin  (LIPITOR) 40 MG tablet Take 40 mg by mouth at bedtime. 03/02/17   [provider]  calcium -vitamin D  (OSCAL WITH D) 500-200 MG-UNIT tablet Take 1 tablet by mouth 2 (two) times daily.    [provider]  citalopram  (CELEXA ) 40 MG tablet Take 40 mg by mouth daily after breakfast.     [provider]  clonazePAM  (KLONOPIN ) 0.5 MG tablet Take 0.5 mg by mouth 3 (three) times daily as needed for anxiety.     [provider]  Cranberry 500 MG CHEW Chew 500 mg by mouth at bedtime.    [provider]  desipramine  (NOPRAMIN) 10 MG tablet Take 10 mg by mouth at bedtime.  03/30/17   [provider]  dicyclomine  (BENTYL ) 10 MG capsule Take 1 capsule (10 mg total) by mouth 3 (three) times daily before meals for 5 days. 04/10/17 04/15/17   Jillian Buttery, MD  EPINEPHrine  (EPIPEN  2-PAK) 0.3 mg/0.3 mL IJ SOAJ injection Inject 0.3 mg into the muscle daily as needed (allergic reaction).    [provider]  ferrous sulfate  325 (65 FE) MG tablet Take 325 mg by mouth daily with breakfast.    [provider]  fluconazole (DIFLUCAN) 100 MG tablet Take 100 mg by mouth daily as needed (yeast infection).     [provider]  fluticasone (FLONASE) 50 MCG/ACT nasal spray Place 2 sprays into the nose daily as needed for allergies.     [provider]  folic acid  (FOLVITE ) 1 MG tablet Take 1 mg by mouth daily after breakfast.     [provider]  gabapentin  (NEURONTIN ) 300 MG capsule Take 300 mg by mouth 3 (three) times daily.     [provider]  levofloxacin  (LEVAQUIN ) 750 MG tablet Take 1 tablet (750 mg total) by mouth daily. 04/11/17   Jillian Buttery, MD  lisinopril  (PRINIVIL ,ZESTRIL ) 2.5 MG tablet Take 2.5 mg by mouth at bedtime.  03/02/17   [provider]  medroxyPROGESTERone Acetate 150 MG/ML SUSY Inject 150 mg as directed every 3 (three) months. 03/31/17   [provider]  meloxicam (MOBIC) 7.5 MG tablet Take 7.5 mg by mouth daily as needed for pain.  03/02/17   [provider]  metFORMIN (GLUCOPHAGE) 1000 MG tablet Take 1,000 mg  by mouth 2 (two) times daily. 03/02/17   [provider]  metoprolol  tartrate (LOPRESSOR ) 25 MG tablet Take 12.5 mg by mouth 2 (two) times daily. 03/02/17   [provider]  nitrofurantoin, macrocrystal-monohydrate, (MACROBID) 100 MG capsule Take 100 mg by mouth 2 (two) times daily. 04/05/17   [provider]  omeprazole (PRILOSEC) 20 MG capsule Take 20 mg by mouth at bedtime.  03/02/17   [provider]  ondansetron  (ZOFRAN -ODT) 8 MG disintegrating tablet Take 8 mg by mouth every 8 (eight) hours as needed for nausea or vomiting.    [provider]  Oxycodone  HCl 10 MG TABS Take by mouth.    [provider]  tiZANidine  (ZANAFLEX ) 4 MG tablet Take 4 mg by mouth every 6 (six) hours as needed for muscle spasms.  03/02/17   [provider]  vitamin B-12 (CYANOCOBALAMIN ) 1000 MCG tablet Take 1,000 mcg by mouth daily after breakfast.     [provider]  vitamin C  (ASCORBIC ACID ) 500 MG tablet Take 500 mg by mouth daily after breakfast.     [provider]    Allergies: Ceftin [cefuroxime axetil], Cinobac [cinoxacin], Fire ant, and Sulfa antibiotics    Review of Systems Negative except as per HPI Updated Vital Signs BP (!) 174/104   Pulse (!) 132   Temp 98.2 F (36.8 C) (Oral)   Resp 20   Ht 5' 1 (1.549 m)   Wt 75.3 kg   SpO2 99%   BMI 31.37 kg/m   Physical Exam Vitals and nursing note reviewed.  Constitutional:      General: She is not in acute distress.    Appearance: She is well-developed. She is not diaphoretic.     Comments: Chronically ill appearing   HENT:     Head: Normocephalic and atraumatic.     Mouth/Throat:     Mouth: Mucous membranes are dry.  Eyes:     Conjunctiva/sclera: Conjunctivae normal.  Cardiovascular:     Rate and Rhythm: Regular rhythm. Tachycardia present.     Pulses: Normal pulses.     Heart sounds: Normal heart sounds.  Pulmonary:     Effort: Pulmonary effort is normal.     Breath sounds: Normal breath sounds.  Abdominal:     Palpations: Abdomen is soft.     Tenderness: There is abdominal tenderness.  Musculoskeletal:     Cervical back: Neck supple.     Right lower leg: No tenderness. No edema.     Left lower leg: No tenderness. No edema.  Skin:    General: Skin is warm and dry.  Neurological:     Mental Status: She is alert and oriented to person, place, and time.  Psychiatric:        Mood and Affect: Mood is anxious.        Behavior: Behavior normal.     (all labs ordered are listed, but only abnormal results are displayed) Labs Reviewed  LACTIC ACID, PLASMA - Abnormal; Notable for the following  components:      Result Value   Lactic Acid, Venous 5.1 (*)    All other components within normal limits  LACTIC ACID, PLASMA - Abnormal; Notable for the following components:   Lactic Acid, Venous 3.4 (*)    All other components within normal limits  COMPREHENSIVE METABOLIC PANEL WITH GFR - Abnormal; Notable for the following components:   Sodium 128 (*)    Potassium 3.3 (*)    Chloride 92 (*)  CO2 16 (*)    Glucose, Bld 505 (*)    Creatinine, Ser 1.28 (*)    Alkaline Phosphatase 157 (*)    GFR, Estimated 52 (*)    Anion gap 20 (*)    All other components within normal limits  CBC WITH DIFFERENTIAL/PLATELET - Abnormal; Notable for the following components:   WBC 21.0 (*)    Neutro Abs 16.7 (*)    Abs Immature Granulocytes 0.13 (*)    All other components within normal limits  URINALYSIS, W/ REFLEX TO CULTURE (INFECTION SUSPECTED) - Abnormal; Notable for the following components:   Glucose, UA >=500 (*)    Hgb urine dipstick TRACE (*)    Bacteria, UA FEW (*)    All other components within normal limits  DIC (DISSEMINATED INTRAVASCULAR COAGULATION)PANEL - Abnormal; Notable for the following components:   Prothrombin Time 24.6 (*)    INR 2.1 (*)    aPTT 43 (*)    Fibrinogen 723 (*)    D-Dimer, Quant 2.38 (*)    All other components within normal limits  LIPASE, BLOOD - Abnormal; Notable for the following components:   Lipase 151 (*)    All other components within normal limits  BASIC METABOLIC PANEL WITH GFR - Abnormal; Notable for the following components:   Potassium 3.4 (*)    CO2 17 (*)    Glucose, Bld 293 (*)    Calcium  7.9 (*)    Anion gap 16 (*)    All other components within normal limits  I-STAT VENOUS BLOOD GAS, ED - Abnormal; Notable for the following components:   pCO2, Ven 33.6 (*)    pO2, Ven 27 (*)    Bicarbonate 18.8 (*)    TCO2 20 (*)    Acid-base deficit 6.0 (*)    Sodium 133 (*)    Potassium 3.4 (*)    Calcium , Ion 1.08 (*)    All other  components within normal limits  CBG MONITORING, ED - Abnormal; Notable for the following components:   Glucose-Capillary 299 (*)    All other components within normal limits  CBG MONITORING, ED - Abnormal; Notable for the following components:   Glucose-Capillary 254 (*)    All other components within normal limits  CBG MONITORING, ED - Abnormal; Notable for the following components:   Glucose-Capillary 231 (*)    All other components within normal limits  TROPONIN T, HIGH SENSITIVITY - Abnormal; Notable for the following components:   Troponin T High Sensitivity 25 (*)    All other components within normal limits  TROPONIN T, HIGH SENSITIVITY - Abnormal; Notable for the following components:   Troponin T High Sensitivity 22 (*)    All other components within normal limits  RESP PANEL BY RT-PCR (RSV, FLU A&B, COVID)  RVPGX2  CULTURE, BLOOD (ROUTINE X 2)  CULTURE, BLOOD (ROUTINE X 2)  PREGNANCY, URINE  BETA-HYDROXYBUTYRIC ACID  HEPARIN  LEVEL (UNFRACTIONATED)  APTT    EKG: EKG Interpretation Date/Time:  Friday January 11 2024 11:02:35 EST Ventricular Rate:  115 PR Interval:  143 QRS Duration:  89 QT Interval:  318 QTC Calculation: 440 R Axis:   16  Text Interpretation: Sinus tachycardia Confirmed by Yolande Charleston [phone]) on 01/11/2024 11:51:31 AM  Radiology: CT CHEST ABDOMEN PELVIS W CONTRAST Result Date: 01/11/2024 EXAM: CT CHEST, ABDOMEN AND PELVIS WITH CONTRAST 01/11/2024 12:23:00 PM TECHNIQUE: CT of the chest, abdomen and pelvis was performed with the administration of intravenous contrast. Multiplanar reformatted images are provided for review.  Automated exposure control, iterative reconstruction, and/or weight based adjustment of the mA/kV was utilized to reduce the radiation dose to as low as reasonably achievable. COMPARISON: CTA chest 03/15/2022, CT abdomen and pelvis 01/30/2017. CLINICAL HISTORY: 48 year old female, sepsis. FINDINGS: CHEST: MEDIASTINUM AND LYMPH  NODES: Low density bilateral pulmonary emboli are present in the lobar branches, at the left main pulmonary artery bifurcation. No saddle embolus. Abundant bilateral lower lobes branch involvement. Heart and pericardium are unremarkable. The central airways are clear. No mediastinal, hilar or axillary lymphadenopathy. Right chest port a cath is new, no adverse features. No pericardial effusion. Negative thoracic aorta. LUNGS AND PLEURA: Multifocal peribronchial ground glass opacity, including in the posterior right upper lobe and streaky more confluent bilateral middle lobe and left lower lobe opacity which is not significantly enhancing. This area does not appear severely affected by pulmonary emboli. No pleural effusion. No large pulmonary infarct. No thoracic lymphadenopathy. Major airways are patent. ABDOMEN AND PELVIS: LIVER: Evidence of chronic hepatic steatosis. GALLBLADDER AND BILE DUCTS: Gallbladder is unremarkable. No biliary ductal dilatation. SPLEEN: Chronic splenectomy. PANCREAS: Chronic splenectomy with mild architectural distortion at the tail of the pancreas. However, peripancreatic inflammatory stranding on coronal image 51 is new compared to 2019 , and also was not apparent on the CTA Chest last year. Pancreatic enhancement is maintained. No ductal dilatation. ADRENAL GLANDS: No acute abnormality. KIDNEYS, URETERS AND BLADDER: No stones in the kidneys or ureters. No hydronephrosis. No perinephric or periureteral stranding. Urinary bladder is unremarkable. GI AND BOWEL: Fluid throughout nondilated large and small bowel loops including fluid in the colon present to the rectum. Normal retrocecal appendix containing gas. No discrete bowel wall thickening, although generalized mucosal hyperenhancement is possible. Nondilated stomach and duodenum. REPRODUCTIVE ORGANS: No acute abnormality. PERITONEUM AND RETROPERITONEUM: No ascites. No free air. Subtle but asymmetric low density within the left common  femoral vein. VASCULATURE: Aorta is normal in caliber. Abdominal aortic calcified atherosclerosis. Major arterial structures and portal venous system appear to be patent. ABDOMINAL AND PELVIS LYMPH NODES: No lymphadenopathy. BONES AND SOFT TISSUES: Chronic disc degeneration at the lumbosacral junction with vacuum disc. No acute osseous abnormality. No focal soft tissue abnormality. IMPRESSION: 1. Acute bilateral pulmonary emboli including some lobar artery involvement (RV/LV ratio 0.71, no obvious heart strain). Possible left common femoral vein deep venous thrombosis; Ultrasound would be necessary to confirm. 2. Superimposed multifocal peribronchial and streaky lung opacity, more consistent with bronchopneumonia/infection than pulmonary infarction. No pleural effusion. 3. Evidence of Acute Pancreatitis limited to the pancreatic tail. 4. Fluid and possible mucosal hyperenhancement throughout nondilated small and large bowel loops, suggesting acute enteritis / diarrhea. Normal appendix. 5. Chronically absent spleen. Chronic hepatic steatosis, calcified aortic atherosclerosis. 6. Salient findings discussed discussed by telephone with Dr. Yolande in the ED at 1244 hours. Electronically signed by: Helayne Hurst MD 01/11/2024 12:47 PM EST RP Workstation: [id number]     .Critical Care  Performed by: Beverley Leita LABOR, PA-C Authorized by: Beverley Leita LABOR, PA-C   Critical care provider statement:    Critical care time (minutes):  90   Critical care was time spent personally by me on the following activities:  Development of treatment plan with patient or surrogate, discussions with consultants, evaluation of patient's response to treatment, examination of patient, ordering and review of laboratory studies, ordering and review of radiographic studies, ordering and performing treatments and interventions, pulse oximetry, re-evaluation of patient's condition and review of old charts    Medications Ordered in the ED  0.9 %  sodium chloride  infusion (has no administration in time range)  heparin  ADULT infusion 100 units/mL (25000 units/250mL) (1,100 Units/hr Intravenous New Bag/Given 01/11/24 1610)  insulin  regular, human (MYXREDLIN ) 100 units/ 100 mL infusion (7 Units/hr Intravenous Rate/Dose Change 01/11/24 1545)  lactated ringers  infusion (0 mLs Intravenous Hold 01/11/24 1625)  dextrose  5 % in lactated ringers  infusion ( Intravenous New Bag/Given 01/11/24 1608)  dextrose  50 % solution 0-50 mL (has no administration in time range)  acetaminophen  (TYLENOL ) tablet 650 mg (650 mg Oral Given 01/11/24 1055)  sodium chloride  0.9 % bolus 1,000 mL (0 mLs Intravenous Stopped 01/11/24 1155)  ondansetron  (ZOFRAN ) injection 4 mg (4 mg Intravenous Given 01/11/24 1104)  HYDROmorphone  (DILAUDID ) injection 1 mg (1 mg Intravenous Given 01/11/24 1106)  sodium chloride  0.9 % bolus 1,000 mL (0 mLs Intravenous Stopped 01/11/24 1306)  aztreonam  (AZACTAM ) 2 g in sodium chloride  0.9 % 100 mL IVPB (0 g Intravenous Stopped 01/11/24 1241)  metroNIDAZOLE  (FLAGYL ) IVPB 500 mg (0 mg Intravenous Stopped 01/11/24 1411)  vancomycin  (VANCOCIN ) IVPB 1000 mg/200 mL premix (0 mg Intravenous Stopped 01/11/24 1537)  iohexol  (OMNIPAQUE ) 300 MG/ML solution 100 mL (100 mLs Intravenous Contrast Given 01/11/24 1217)  heparin  bolus via infusion 4,000 Units (4,000 Units Intravenous Bolus from Bag 01/11/24 1610)  potassium chloride  SA (KLOR-CON  M) CR tablet 40 mEq (40 mEq Oral Given 01/11/24 1437)  lactated ringers  bolus 1,000 mL (1,000 mLs Intravenous New Bag/Given 01/11/24 1437)  HYDROmorphone  (DILAUDID ) injection 1 mg (1 mg Intravenous Given 01/11/24 1624)                                    Medical Decision Making Amount and/or Complexity of Data Reviewed Labs: ordered. Radiology: ordered.  Risk OTC drugs. Prescription drug management. Decision regarding hospitalization.   This patient presents to the ED for concern of dehydration,  this involves an extensive number of treatment options, and is a complaint that carries with it a high risk of complications and morbidity.  The differential diagnosis includes but not limited to sepsis, DKA, dehydration, bowel obstruction, PNA, viral URI (COVID, flu, RSV)   Co morbidities / Chronic conditions that complicate the patient evaluation  Asplenic, diabetes, hypertension, Niemann-Pick disease, on immune therapy for report of lowered immune system.  Reports history of blood cancer, not currently active on treatment.   Additional history obtained:  Additional history obtained from EMR External records from outside source obtained and reviewed including prior labs on file    Lab Tests:  I Ordered, and personally interpreted labs.  The pertinent results include: Initial lactic elevated at 5, repeat after fluid bolus was 3.4.  Urinalysis with trace hemoglobin, negative for nitrites and leukocytes.  Negative for COVID, flu, RSV.  CBC with white count 21, similar to prior on file.  Neutrophils are elevated.  Lipase elevated at 151.  CMP with hyponatremia with sodium of 128 likely secondary to hyperglycemia with glucose of 505.  Bicarb is 16 with gap of 20.  Mild hypokalemia with potassium 3.3 today repeat CBG after fluid boluses 299, repeat at 254. VBG with normal pH.    Imaging Studies ordered:  I ordered imaging studies including CT chest abdomen pelvis I independently visualized and interpreted imaging which showed no free air, multiple findings as listed below  I agree with the radiologist interpretation Concern for PEs, also possibility of DVT of the left common femoral vein Bronchopneumonia Pancreatitis Enteritis  Cardiac Monitoring: / EKG:  The patient was maintained on a cardiac monitor.  I personally viewed and interpreted the cardiac monitored which showed an underlying rhythm of: Sinus tachycardia, rate 115   Problem List / ED Course / Critical interventions /  Medication management  48 year old female presents with fever, vomiting and feeling dehydrated although she has not vomited since Wednesday, also left flank pain.  Due to patient's tachycardic, febrile state on arrival with heard of asplenia and immune deficiency, code sepsis was activated and she is provided with broad-spectrum antibiotics and fluid bolus per protocol.  She is found to have bilateral PEs and a left likely femoral DVT, had stopped her Xarelto  a few weeks ago.  Also bronchopneumonia, also pancreatitis, enteritis.  Patient was admitted to the hospital for further management. I ordered medication including broad-spectrum antibiotics and fluid bolus per sepsis protocol Heparin  initiated per Dr. Jakie after consultation with radiology for PE and DVT Insulin  drip initiated after consultation with Dr. Jakie for patient's hyperglycemia K-dur for hypokalemia Reevaluation of the patient after these medicines showed that the patient stable I have reviewed the patients home medicines and have made adjustments as needed   Consultations Obtained:  I requested consultation with the ER attending, Dr. Yolande,  and discussed lab and imaging findings as well as pertinent plan - they recommend: sepsis orders initiated on arrival with broad spectrum abx. Glucose elevated with metabolic acidosis, vbg with normal pH, recommends starting insulin  drip.  Discussed with Dr. Donzetta with critical care, available for consult but can be admitted to hospitalist service at [facility] Discussed with Dr. Georgina with [facility] hospitalist service who will request admission to [facility].   Social Determinants of Health:  Has PCP, lives with family   Test / Admission - Considered:  admit      Final diagnoses:  Sepsis, due to unspecified organism, unspecified whether acute organ dysfunction present [facility])  Other acute pulmonary embolism without acute cor pulmonale (HCC)  Acute pancreatitis without  infection or necrosis, unspecified pancreatitis type  Metabolic acidosis due to diabetes mellitus [facility])    ED Discharge Orders     None          Beverley Leita LABOR, PA-C 01/11/24 1637    Beverley Leita LABOR, PA-C 01/11/24 1638    Yolande Lamar BROCKS, MD 01/15/24 1100

## 2024-01-11 NOTE — H&P (Incomplete)
 History and Physical    Megan Weber [name] DOB: [birth year] DOA: 01/11/2024  PCP: Pia Kerney SQUIBB, MD  Patient coming from:  [facility]  I have personally briefly reviewed patient's old medical records in [facility]  Chief Complaint:  n/vx3 days, chest tightness  HPI: Megan Weber is a 48 y.o. 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    ED Course:   Temp 101.4, bp 145/91, hr 116, rr 20, sat 99%  UA :few bacteria, + glucose  RVP: Neg Wbc 21( at baseline ), hgb 12.8, left shift  Na 128, K 3.3, cl92, bicarb 16, glu505, cr 1.28 ( 0.78) alphos 157,AG 20 CE25,22 Lipase151 Lactic 5.1,3.4 Vbg 7.3/33.6 Betahydroxy .10  CTAB pelvis IMPRESSION: 1. Acute bilateral pulmonary emboli including some lobar artery involvement (RV/LV ratio 0.71, no obvious heart strain). Possible left common femoral vein deep venous thrombosis; Ultrasound would be necessary to confirm. 2. Superimposed multifocal peribronchial and streaky lung opacity, more consistent with bronchopneumonia/infection than pulmonary infarction. No pleural effusion. 3. Evidence of Acute Pancreatitis limited to the pancreatic tail. 4. Fluid and possible mucosal hyperenhancement throughout nondilated small and large bowel loops, suggesting acute enteritis / diarrhea. Normal appendix. 5. Chronically absent spleen. Chronic hepatic steatosis, calcified aortic atherosclerosis. 6. Salient findings discussed discussed by telephone with Dr. Yolande in the ED at 1244 hours.   Tx aztreonam  Review of Systems:  As per HPI otherwise 10 point review of systems negative.   Past Medical History:  Diagnosis Date   Abnormal glucose    Adjustment disorder    Asplenia    Cerebral aneurysm    Cerebral degeneration associated with generalized lipidosis    Depression    Diabetes mellitus without complication (HCC)    Hypertension    OCD (obsessive compulsive disorder)    Peripheral neuropathy     Past Surgical History:  Procedure Laterality Date   SPLENECTOMY     SPLENECTOMY       reports that she has never smoked. She has never used smokeless tobacco. She reports that she does not drink alcohol and does not use drugs.  Allergies[1]  Family History  Problem Relation Age of Onset   Diabetes Mellitus II Father     Prior to Admission medications  Medication Sig Start Date End Date Taking? Authorizing Provider  atorvastatin  (LIPITOR) 40 MG tablet Take 40 mg by mouth at bedtime. 03/02/17   [provider]  calcium -vitamin D  (OSCAL WITH D) 500-200 MG-UNIT tablet Take 1 tablet by mouth 2 (two) times daily.    [provider]  citalopram  (CELEXA ) 40 MG tablet Take 40 mg by mouth daily after breakfast.     [provider]  clonazePAM  (KLONOPIN ) 0.5 MG tablet Take 0.5 mg by mouth 3 (three) times daily as needed for anxiety.     [provider]  Cranberry 500 MG CHEW Chew 500 mg by mouth at bedtime.    [provider]  desipramine  (NOPRAMIN) 10 MG tablet Take 10 mg by mouth at bedtime.  03/30/17   [provider]  dicyclomine  (BENTYL ) 10 MG capsule Take 1 capsule (10 mg total) by mouth 3 (  three) times daily before meals for 5 days. 04/10/17 04/15/17  Adhikari, Amrit, MD  EPINEPHrine (EPIPEN 2-PAK) 0.3 mg/0.3 mL IJ SOAJ injection Inject 0.3 mg into the muscle daily as needed (allergic reaction).    [provider]  ferrous sulfate  325 (65 FE) MG tablet Take 325 mg by mouth daily with breakfast.    [provider]  fluconazole  (DIFLUCAN) 100 MG tablet Take 100 mg by mouth daily as needed (yeast infection).     [provider]  fluticasone (FLONASE) 50 MCG/ACT nasal spray Place 2 sprays into the nose daily as needed for allergies.     [provider]  folic acid  (FOLVITE ) 1 MG tablet Take 1 mg by mouth daily after breakfast.     [provider]  gabapentin  (NEURONTIN ) 300 MG capsule Take 300 mg by mouth 3 (three) times daily.     [provider]  levofloxacin  (LEVAQUIN ) 750 MG tablet Take 1 tablet (750 mg total) by mouth daily. 04/11/17   Jillian Buttery, MD  lisinopril  (PRINIVIL ,ZESTRIL ) 2.5 MG tablet Take 2.5 mg by mouth at bedtime.  03/02/17   [provider]  medroxyPROGESTERone Acetate 150 MG/ML SUSY Inject 150 mg as directed every 3 (three) months. 03/31/17   [provider]  meloxicam (MOBIC) 7.5 MG tablet Take 7.5 mg by mouth daily as needed for pain.  03/02/17   [provider]  metFORMIN (GLUCOPHAGE) 1000 MG tablet Take 1,000 mg by mouth 2 (two) times daily. 03/02/17   [provider]  metoprolol  tartrate (LOPRESSOR ) 25 MG tablet Take 12.5 mg by mouth 2 (two) times daily. 03/02/17   [provider]  nitrofurantoin, macrocrystal-monohydrate, (MACROBID) 100 MG capsule Take 100 mg by mouth 2 (two) times daily. 04/05/17   [provider]  omeprazole (PRILOSEC) 20 MG capsule Take 20 mg by mouth at bedtime.  03/02/17   [provider]  ondansetron  (ZOFRAN -ODT) 8 MG disintegrating tablet Take 8 mg by mouth every 8 (eight) hours as needed for nausea or vomiting.    [provider]  Oxycodone HCl 10 MG TABS Take by mouth.    [provider]  tiZANidine (ZANAFLEX) 4 MG tablet Take 4 mg by mouth every 6 (six) hours as needed for muscle spasms.  03/02/17   [provider]  vitamin B-12 (CYANOCOBALAMIN ) 1000 MCG tablet Take 1,000 mcg by mouth daily after breakfast.     [provider]  vitamin C  (ASCORBIC ACID )  500 MG tablet Take 500 mg by mouth daily after breakfast.     [provider]    Physical Exam: Vitals:   01/11/24 1730 01/11/24 1900 01/11/24 1908 01/11/24 2025  BP: (!) 151/101 (!) 198/96 (!) 194/92   Pulse: (!) 140 (!) 143 (!) 142   Resp: (!) 26 (!) 24 (!) 24   Temp:    99.1 F (37.3 C)  TempSrc:    Oral  SpO2: 98% 96% 97%   Weight:      Height:        Constitutional: NAD, calm, comfortable Vitals:   01/11/24 1730 01/11/24 1900 01/11/24 1908 01/11/24 2025  BP: (!) 151/101 (!) 198/96 (!) 194/92   Pulse: (!) 140 (!) 143 (!) 142   Resp: (!) 26 (!) 24 (!) 24   Temp:    99.1 F (37.3 C)  TempSrc:    Oral  SpO2: 98% 96% 97%   Weight:      Height:       Eyes: PERRL,  lids and conjunctivae normal ENMT: Mucous membranes are moist. Posterior pharynx clear of any exudate or lesions.Normal dentition.  Neck: normal, supple, no masses, no thyromegaly Respiratory: clear to auscultation bilaterally, no wheezing, no crackles. Normal respiratory effort. No accessory muscle use.  Cardiovascular: Regular rate and rhythm, no murmurs / rubs / gallops. No extremity edema. 2+ pedal pulses. No carotid bruits.  Abdomen: no tenderness, no masses palpated. No hepatosplenomegaly. Bowel sounds positive.  Musculoskeletal: no clubbing / cyanosis. No joint deformity upper and lower extremities. Good ROM, no contractures. Normal muscle tone.  Skin: no rashes, lesions, ulcers. No induration Neurologic: CN 2-12 grossly intact. Sensation intact, DTR normal. Strength 5/5 in all 4.  Psychiatric: Normal judgment and insight. Alert and oriented x 3. Normal mood.    Labs on Admission: I have personally reviewed following labs and imaging studies  CBC: Recent Labs  Lab 01/11/24 1054 01/11/24 1221  WBC 21.0*  --   NEUTROABS 16.7*  --   HGB 12.8 12.9  HCT 37.3 38.0  MCV 85.9  --   PLT 325  325  --    Basic Metabolic Panel: Recent Labs  Lab 01/11/24 1054 01/11/24 1221 01/11/24 1359  NA  128* 133* 136  K 3.3* 3.4* 3.4*  CL 92*  --  103  CO2 16*  --  17*  GLUCOSE 505*  --  293*  BUN 16  --  13  CREATININE 1.28*  --  0.90  CALCIUM  9.1  --  7.9*   GFR: Estimated Creatinine Clearance: 72.5 mL/min (by C-G formula based on SCr of 0.9 mg/dL). Liver Function Tests: Recent Labs  Lab 01/11/24 1054  AST 36  ALT 18  ALKPHOS 157*  BILITOT 0.7  PROT 7.2  ALBUMIN 3.7   Recent Labs  Lab 01/11/24 1054  LIPASE 151*   No results for input(s): AMMONIA in the last 168 hours. Coagulation Profile: Recent Labs  Lab 01/11/24 1054  INR 2.1*   Cardiac Enzymes: No results for input(s): CKTOTAL, CKMB, CKMBINDEX, TROPONINI in the last 168 hours. BNP (last 3 results) No results for input(s): PROBNP in the last 8760 hours. HbA1C: No results for input(s): HGBA1C in the last 72 hours. CBG: Recent Labs  Lab 01/11/24 1304 01/11/24 1428 01/11/24 1543 01/11/24 1708 01/11/24 2010  GLUCAP 299* 254* 231* 121* 247*   Lipid Profile: No results for input(s): CHOL, HDL, LDLCALC, TRIG, CHOLHDL, LDLDIRECT in the last 72 hours. Thyroid Function Tests: No results for input(s): TSH, T4TOTAL, FREET4, T3FREE, THYROIDAB in the last 72 hours. Anemia Panel: No results for input(s): VITAMINB12, FOLATE, FERRITIN, TIBC, IRON, RETICCTPCT in the last 72 hours. Urine analysis:    Component Value Date/Time   COLORURINE YELLOW 01/11/2024 1011   APPEARANCEUR CLEAR 01/11/2024 1011   LABSPEC <=1.005 01/11/2024 1011   PHURINE 5.5 01/11/2024 1011   GLUCOSEU >=500 (A) 01/11/2024 1011   HGBUR TRACE (A) 01/11/2024 1011   BILIRUBINUR NEGATIVE 01/11/2024 1011   KETONESUR NEGATIVE 01/11/2024 1011   PROTEINUR NEGATIVE 01/11/2024 1011   UROBILINOGEN 0.2 06/21/2012 1652   NITRITE NEGATIVE 01/11/2024 1011   LEUKOCYTESUR NEGATIVE 01/11/2024 1011    Radiological Exams on Admission: CT CHEST ABDOMEN PELVIS W CONTRAST Result Date: 01/11/2024 EXAM: CT CHEST,  ABDOMEN AND PELVIS WITH CONTRAST 01/11/2024 12:23:00 PM TECHNIQUE: CT of the chest, abdomen and pelvis was performed with the administration of intravenous contrast. Multiplanar reformatted images are provided for review. Automated exposure control, iterative reconstruction, and/or weight based adjustment of the mA/kV was utilized to  reduce the radiation dose to as low as reasonably achievable. COMPARISON: CTA chest 03/15/2022, CT abdomen and pelvis 01/30/2017. CLINICAL HISTORY: 48 year old female, sepsis. FINDINGS: CHEST: MEDIASTINUM AND LYMPH NODES: Low density bilateral pulmonary emboli are present in the lobar branches, at the left main pulmonary artery bifurcation. No saddle embolus. Abundant bilateral lower lobes branch involvement. Heart and pericardium are unremarkable. The central airways are clear. No mediastinal, hilar or axillary lymphadenopathy. Right chest port a cath is new, no adverse features. No pericardial effusion. Negative thoracic aorta. LUNGS AND PLEURA: Multifocal peribronchial ground glass opacity, including in the posterior right upper lobe and streaky more confluent bilateral middle lobe and left lower lobe opacity which is not significantly enhancing. This area does not appear severely affected by pulmonary emboli. No pleural effusion. No large pulmonary infarct. No thoracic lymphadenopathy. Major airways are patent. ABDOMEN AND PELVIS: LIVER: Evidence of chronic hepatic steatosis. GALLBLADDER AND BILE DUCTS: Gallbladder is unremarkable. No biliary ductal dilatation. SPLEEN: Chronic splenectomy. PANCREAS: Chronic splenectomy with mild architectural distortion at the tail of the pancreas. However, peripancreatic inflammatory stranding on coronal image 51 is new compared to 2019 , and also was not apparent on the CTA Chest last year. Pancreatic enhancement is maintained. No ductal dilatation. ADRENAL GLANDS: No acute abnormality. KIDNEYS, URETERS AND BLADDER: No stones in the kidneys or  ureters. No hydronephrosis. No perinephric or periureteral stranding. Urinary bladder is unremarkable. GI AND BOWEL: Fluid throughout nondilated large and small bowel loops including fluid in the colon present to the rectum. Normal retrocecal appendix containing gas. No discrete bowel wall thickening, although generalized mucosal hyperenhancement is possible. Nondilated stomach and duodenum. REPRODUCTIVE ORGANS: No acute abnormality. PERITONEUM AND RETROPERITONEUM: No ascites. No free air. Subtle but asymmetric low density within the left common femoral vein. VASCULATURE: Aorta is normal in caliber. Abdominal aortic calcified atherosclerosis. Major arterial structures and portal venous system appear to be patent. ABDOMINAL AND PELVIS LYMPH NODES: No lymphadenopathy. BONES AND SOFT TISSUES: Chronic disc degeneration at the lumbosacral junction with vacuum disc. No acute osseous abnormality. No focal soft tissue abnormality. IMPRESSION: 1. Acute bilateral pulmonary emboli including some lobar artery involvement (RV/LV ratio 0.71, no obvious heart strain). Possible left common femoral vein deep venous thrombosis; Ultrasound would be necessary to confirm. 2. Superimposed multifocal peribronchial and streaky lung opacity, more consistent with bronchopneumonia/infection than pulmonary infarction. No pleural effusion. 3. Evidence of Acute Pancreatitis limited to the pancreatic tail. 4. Fluid and possible mucosal hyperenhancement throughout nondilated small and large bowel loops, suggesting acute enteritis / diarrhea. Normal appendix. 5. Chronically absent spleen. Chronic hepatic steatosis, calcified aortic atherosclerosis. 6. Salient findings discussed discussed by telephone with Dr. Yolande in the ED at 1244 hours. Electronically signed by: Helayne Hurst MD 01/11/2024 12:47 PM EST RP Workstation: [id number]    EKG: Independently reviewed.   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 Rosalita in neurosurgery.   Systemic mastocytosis/hypogammaglobulinemia -on venom immuno therapy   -continue Xembify  sq one a week  GERD -ppi   HLD Continue statin   OSA    General anxiety d/o  -continue klonipin tid prn  -atarax prn  Menopause symptoms  - on medroxy progesterone, q 3 month  -in setting of clots will have to discontinue   Major depression  -continue abilify   Chronic nausea nos  -on zofran  ODT as outpatient   IDA  -continue iron tabs    Hx of Tremor nos  - on pramipexole    DVT prophylaxis:  Code Status:  Family Communication:  Disposition Plan:  Consults called:  Admission status:    Camila DELENA Ned MD [facility] Pager [phone]  If 7PM-7AM, please contact night-coverage [email or web address] Password [email or web address]  01/11/2024, 8:26 PM         [1] Allergies Allergen Reactions   Ceftin [Cefuroxime Axetil] Shortness Of Breath and Swelling   Cinobac [Cinoxacin] Shortness Of Breath and Swelling   Fire Ant Anaphylaxis   Sulfa Antibiotics Other (See Comments)    Unknown

## 2024-01-11 NOTE — ED Notes (Signed)
 Heparin was not started due to limited IV access and Vanc actively infusing.

## 2024-01-11 NOTE — H&P (Addendum)
 History and Physical    Megan Weber [name] DOB: [birth year] DOA: 01/11/2024  PCP: Pia Kerney SQUIBB, MD  Patient coming from:  [facility]  I have personally briefly reviewed patient's old medical records in [facility]  Chief Complaint:  n/vx3 days, chest tightness  HPI: Megan Weber is a 48 y.o. 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    ED Course:   Temp 101.4, bp 145/91, hr 116, rr 20, sat 99%  UA :few bacteria, + glucose  RVP: Neg Wbc 21( at baseline ), hgb 12.8, left shift  Na 128, K 3.3, cl92, bicarb 16, glu505, cr 1.28 ( 0.78) alphos 157,AG 20 CE25,22 Lipase151 Lactic 5.1,3.4 Vbg 7.3/33.6 Betahydroxy .10  CTAB pelvis IMPRESSION: 1. Acute bilateral pulmonary emboli including some lobar artery involvement (RV/LV ratio 0.71, no obvious heart strain). Possible left common femoral vein deep venous thrombosis; Ultrasound would be necessary to confirm. 2. Superimposed multifocal peribronchial and streaky lung opacity, more consistent with bronchopneumonia/infection than pulmonary infarction. No pleural effusion. 3. Evidence of Acute Pancreatitis limited to the pancreatic tail. 4. Fluid and possible mucosal hyperenhancement throughout nondilated small and large bowel loops, suggesting acute enteritis / diarrhea. Normal appendix. 5. Chronically absent spleen. Chronic hepatic steatosis, calcified aortic atherosclerosis. 6. Salient findings discussed discussed by telephone with Dr. Yolande in the ED at 1244 hours.   Tx aztreonam  Review of Systems:  As per HPI otherwise 10 point review of systems negative.   Past Medical History:  Diagnosis Date   Abnormal glucose    Adjustment disorder    Asplenia    Cerebral aneurysm    Cerebral degeneration associated with generalized lipidosis    Depression    Diabetes mellitus without complication (HCC)    Hypertension    OCD (obsessive compulsive disorder)    Peripheral neuropathy     Past Surgical History:  Procedure Laterality Date   SPLENECTOMY     SPLENECTOMY       reports that she has never smoked. She has never used smokeless tobacco. She reports that she does not drink alcohol and does not use drugs.  Allergies[1]  Family History  Problem Relation Age of Onset   Diabetes Mellitus II Father     Prior to Admission medications  Medication Sig Start Date End Date Taking? Authorizing Provider  atorvastatin  (LIPITOR) 40 MG tablet Take 40 mg by mouth at bedtime. 03/02/17   [provider]  calcium -vitamin D  (OSCAL WITH D) 500-200 MG-UNIT tablet Take 1 tablet by mouth 2 (two) times daily.    [provider]  citalopram  (CELEXA ) 40 MG tablet Take 40 mg by mouth daily after breakfast.     [provider]  clonazePAM  (KLONOPIN ) 0.5 MG tablet Take 0.5 mg by mouth 3 (three) times daily as needed for anxiety.     [provider]  Cranberry 500 MG CHEW Chew 500 mg by mouth at bedtime.    [provider]  desipramine  (NOPRAMIN) 10 MG tablet Take 10 mg by mouth at bedtime.  03/30/17   [provider]  dicyclomine  (BENTYL ) 10 MG capsule Take 1 capsule (10 mg total) by mouth 3 (  three) times daily before meals for 5 days. 04/10/17 04/15/17  Jillian Buttery, MD  EPINEPHrine  (EPIPEN  2-PAK) 0.3 mg/0.3 mL IJ SOAJ injection Inject 0.3 mg into the muscle daily as needed (allergic reaction).    [provider]  ferrous sulfate  325 (65 FE) MG tablet Take 325 mg by mouth daily with breakfast.    [provider]  fluconazole (DIFLUCAN)  100 MG tablet Take 100 mg by mouth daily as needed (yeast infection).     [provider]  fluticasone (FLONASE) 50 MCG/ACT nasal spray Place 2 sprays into the nose daily as needed for allergies.     [provider]  folic acid  (FOLVITE ) 1 MG tablet Take 1 mg by mouth daily after breakfast.     [provider]  gabapentin  (NEURONTIN ) 300 MG capsule Take 300 mg by mouth 3 (three) times daily.     [provider]  levofloxacin  (LEVAQUIN ) 750 MG tablet Take 1 tablet (750 mg total) by mouth daily. 04/11/17   Jillian Buttery, MD  lisinopril  (PRINIVIL ,ZESTRIL ) 2.5 MG tablet Take 2.5 mg by mouth at bedtime.  03/02/17   [provider]  medroxyPROGESTERone Acetate 150 MG/ML SUSY Inject 150 mg as directed every 3 (three) months. 03/31/17   [provider]  meloxicam (MOBIC) 7.5 MG tablet Take 7.5 mg by mouth daily as needed for pain.  03/02/17   [provider]  metFORMIN (GLUCOPHAGE) 1000 MG tablet Take 1,000 mg by mouth 2 (two) times daily. 03/02/17   [provider]  metoprolol  tartrate (LOPRESSOR ) 25 MG tablet Take 12.5 mg by mouth 2 (two) times daily. 03/02/17   [provider]  nitrofurantoin, macrocrystal-monohydrate, (MACROBID) 100 MG capsule Take 100 mg by mouth 2 (two) times daily. 04/05/17   [provider]  omeprazole (PRILOSEC) 20 MG capsule Take 20 mg by mouth at bedtime.  03/02/17   [provider]  ondansetron  (ZOFRAN -ODT) 8 MG disintegrating tablet Take 8 mg by mouth every 8 (eight) hours as needed for nausea or vomiting.    [provider]  Oxycodone  HCl 10 MG TABS Take by mouth.    [provider]  tiZANidine  (ZANAFLEX ) 4 MG tablet Take 4 mg by mouth every 6 (six) hours as needed for muscle spasms.  03/02/17   [provider]  vitamin B-12 (CYANOCOBALAMIN ) 1000 MCG tablet Take 1,000 mcg by mouth daily after breakfast.     [provider]  vitamin C  (ASCORBIC ACID ) 500 MG  tablet Take 500 mg by mouth daily after breakfast.     [provider]    Physical Exam: Vitals:   01/11/24 1730 01/11/24 1900 01/11/24 1908 01/11/24 2025  BP: (!) 151/101 (!) 198/96 (!) 194/92   Pulse: (!) 140 (!) 143 (!) 142   Resp: (!) 26 (!) 24 (!) 24   Temp:    99.1 F (37.3 C)  TempSrc:    Oral  SpO2: 98% 96% 97%   Weight:      Height:        Constitutional: NAD, calm, comfortable Vitals:   01/11/24 1730 01/11/24 1900 01/11/24 1908 01/11/24 2025  BP: (!) 151/101 (!) 198/96 (!) 194/92   Pulse: (!) 140 (!) 143 (!) 142   Resp: (!) 26 (!) 24 (!) 24   Temp:    99.1 F (37.3 C)  TempSrc:    Oral  SpO2: 98% 96% 97%   Weight:      Height:       Eyes: PERRL,  lids and conjunctivae normal ENMT: Mucous membranes are moist. Posterior pharynx clear of any exudate or lesions.Normal dentition.  Neck: normal, supple, no masses, no thyromegaly Respiratory: clear to auscultation bilaterally, no wheezing, no crackles. Normal respiratory effort. No accessory muscle use.  Cardiovascular: Regular rate and rhythm, no murmurs / rubs / gallops. No extremity edema. 2+ pedal pulses. No carotid bruits.  Abdomen: no tenderness, no masses palpated. No hepatosplenomegaly. Bowel sounds positive.  Musculoskeletal: no clubbing / cyanosis. No joint deformity upper and lower extremities. Good ROM, no contractures. Normal muscle tone.  Skin: no rashes, lesions, ulcers. No induration Neurologic: CN 2-12 grossly intact. Sensation intact, DTR normal. Strength 5/5 in all 4.  Psychiatric: Normal judgment and insight. Alert and oriented x 3. Normal mood.    Labs on Admission: I have personally reviewed following labs and imaging studies  CBC: Recent Labs  Lab 01/11/24 1054 01/11/24 1221  WBC 21.0*  --   NEUTROABS 16.7*  --   HGB 12.8 12.9  HCT 37.3 38.0  MCV 85.9  --   PLT 325  325  --    Basic Metabolic Panel: Recent Labs  Lab 01/11/24 1054 01/11/24 1221 01/11/24 1359  NA 128* 133*  136  K 3.3* 3.4* 3.4*  CL 92*  --  103  CO2 16*  --  17*  GLUCOSE 505*  --  293*  BUN 16  --  13  CREATININE 1.28*  --  0.90  CALCIUM  9.1  --  7.9*   GFR: Estimated Creatinine Clearance: 72.5 mL/min (by C-G formula based on SCr of 0.9 mg/dL). Liver Function Tests: Recent Labs  Lab 01/11/24 1054  AST 36  ALT 18  ALKPHOS 157*  BILITOT 0.7  PROT 7.2  ALBUMIN  3.7   Recent Labs  Lab 01/11/24 1054  LIPASE 151*   No results for input(s): AMMONIA in the last 168 hours. Coagulation Profile: Recent Labs  Lab 01/11/24 1054  INR 2.1*   Cardiac Enzymes: No results for input(s): CKTOTAL, CKMB, CKMBINDEX, TROPONINI in the last 168 hours. BNP (last 3 results) No results for input(s): PROBNP in the last 8760 hours. HbA1C: No results for input(s): HGBA1C in the last 72 hours. CBG: Recent Labs  Lab 01/11/24 1304 01/11/24 1428 01/11/24 1543 01/11/24 1708 01/11/24 2010  GLUCAP 299* 254* 231* 121* 247*   Lipid Profile: No results for input(s): CHOL, HDL, LDLCALC, TRIG, CHOLHDL, LDLDIRECT in the last 72 hours. Thyroid Function Tests: No results for input(s): TSH, T4TOTAL, FREET4, T3FREE, THYROIDAB in the last 72 hours. Anemia Panel: No results for input(s): VITAMINB12, FOLATE, FERRITIN, TIBC, IRON, RETICCTPCT in the last 72 hours. Urine analysis:    Component Value Date/Time   COLORURINE YELLOW 01/11/2024 1011   APPEARANCEUR CLEAR 01/11/2024 1011   LABSPEC <=1.005 01/11/2024 1011   PHURINE 5.5 01/11/2024 1011   GLUCOSEU >=500 (A) 01/11/2024 1011   HGBUR TRACE (A) 01/11/2024 1011   BILIRUBINUR NEGATIVE 01/11/2024 1011   KETONESUR NEGATIVE 01/11/2024 1011   PROTEINUR NEGATIVE 01/11/2024 1011   UROBILINOGEN 0.2 06/21/2012 1652   NITRITE NEGATIVE 01/11/2024 1011   LEUKOCYTESUR NEGATIVE 01/11/2024 1011    Radiological Exams on Admission: CT CHEST ABDOMEN PELVIS W CONTRAST Result Date: 01/11/2024 EXAM: CT CHEST, ABDOMEN  AND PELVIS WITH CONTRAST 01/11/2024 12:23:00 PM TECHNIQUE: CT of the chest, abdomen and pelvis was performed with the administration of intravenous contrast. Multiplanar reformatted images are provided for review. Automated exposure control, iterative reconstruction, and/or weight based adjustment of the mA/kV was utilized to  reduce the radiation dose to as low as reasonably achievable. COMPARISON: CTA chest 03/15/2022, CT abdomen and pelvis 01/30/2017. CLINICAL HISTORY: 48 year old female, sepsis. FINDINGS: CHEST: MEDIASTINUM AND LYMPH NODES: Low density bilateral pulmonary emboli are present in the lobar branches, at the left main pulmonary artery bifurcation. No saddle embolus. Abundant bilateral lower lobes branch involvement. Heart and pericardium are unremarkable. The central airways are clear. No mediastinal, hilar or axillary lymphadenopathy. Right chest port a cath is new, no adverse features. No pericardial effusion. Negative thoracic aorta. LUNGS AND PLEURA: Multifocal peribronchial ground glass opacity, including in the posterior right upper lobe and streaky more confluent bilateral middle lobe and left lower lobe opacity which is not significantly enhancing. This area does not appear severely affected by pulmonary emboli. No pleural effusion. No large pulmonary infarct. No thoracic lymphadenopathy. Major airways are patent. ABDOMEN AND PELVIS: LIVER: Evidence of chronic hepatic steatosis. GALLBLADDER AND BILE DUCTS: Gallbladder is unremarkable. No biliary ductal dilatation. SPLEEN: Chronic splenectomy. PANCREAS: Chronic splenectomy with mild architectural distortion at the tail of the pancreas. However, peripancreatic inflammatory stranding on coronal image 51 is new compared to 2019 , and also was not apparent on the CTA Chest last year. Pancreatic enhancement is maintained. No ductal dilatation. ADRENAL GLANDS: No acute abnormality. KIDNEYS, URETERS AND BLADDER: No stones in the kidneys or ureters. No  hydronephrosis. No perinephric or periureteral stranding. Urinary bladder is unremarkable. GI AND BOWEL: Fluid throughout nondilated large and small bowel loops including fluid in the colon present to the rectum. Normal retrocecal appendix containing gas. No discrete bowel wall thickening, although generalized mucosal hyperenhancement is possible. Nondilated stomach and duodenum. REPRODUCTIVE ORGANS: No acute abnormality. PERITONEUM AND RETROPERITONEUM: No ascites. No free air. Subtle but asymmetric low density within the left common femoral vein. VASCULATURE: Aorta is normal in caliber. Abdominal aortic calcified atherosclerosis. Major arterial structures and portal venous system appear to be patent. ABDOMINAL AND PELVIS LYMPH NODES: No lymphadenopathy. BONES AND SOFT TISSUES: Chronic disc degeneration at the lumbosacral junction with vacuum disc. No acute osseous abnormality. No focal soft tissue abnormality. IMPRESSION: 1. Acute bilateral pulmonary emboli including some lobar artery involvement (RV/LV ratio 0.71, no obvious heart strain). Possible left common femoral vein deep venous thrombosis; Ultrasound would be necessary to confirm. 2. Superimposed multifocal peribronchial and streaky lung opacity, more consistent with bronchopneumonia/infection than pulmonary infarction. No pleural effusion. 3. Evidence of Acute Pancreatitis limited to the pancreatic tail. 4. Fluid and possible mucosal hyperenhancement throughout nondilated small and large bowel loops, suggesting acute enteritis / diarrhea. Normal appendix. 5. Chronically absent spleen. Chronic hepatic steatosis, calcified aortic atherosclerosis. 6. Salient findings discussed discussed by telephone with Dr. Yolande in the ED at 1244 hours. Electronically signed by: Helayne Hurst MD 01/11/2024 12:47 PM EST RP Workstation: [id number]    EKG: Independently reviewed.   Assessment/Plan  Multifocal Pneumonia with Sepsis  -in setting hypogammaglobinemia   -admit to step down ICU  - placed on broad spectrum abx  -f/u sputum and blood culture data  Hyperglycemic Crisis -placed on endo tool  -noted elevated anion gap in setting lactic acidosis  -noted BHB negative x 2  - fs in 200-290 range currently  -will continue with endo tool overnight in setting of sepsis and uncontrolled sugars,transition in am per icu rec  B/L PE  -d-dimer2.38 -inr 2.1 -per patient she has not taken any of medications x 2 weeks  - continue on heparin  drip  -echo in am to evaluate strain  - check bnp,  CE.   Acute pancreatitis  -lipase 151 -continue with ivfs  -npo , may advance to clear liquids  AKI  -hold nephrotoxic medication  -continue with ivfs  -patient has good Urine output   Hypokalemia -replete prn   Abn CE  -due to demand , acute infection , aki -continue to monitor  -echo pending in am  Chronic medical conditions:   Niemman-Pick  type B  disease c/b splenectomy -continue on plauquenil  -continue on Xenopozyme infusions  - Cerebral Aneursym  pericallosal cerebral aneursym that is monitored by Dr. Rosalita in neurosurgery.   Systemic mastocytosis/hypogammaglobulinemia -on venom immuno therapy   -continue Xembify  sq one a week  GERD -ppi   HLD Continue statin   OSA  -O2 at bedtime    General anxiety d/o  -continue klonipin tid prn  -atarax prn   Menopause symptoms  - on medroxy progesterone, q 3 month  -in setting of clots will have to discontinue   Major depression  -continue abilify   Chronic nausea nos  -on zofran  ODT as outpatient   IDA  -resume Iron supplement on d/c   Hx of Tremor nos  - on pramipexole as out pt    DVT prophylaxis:  heparin  Code Status: full/ as discussed per patient wishes in event of cardiac arrest  Family Communication: none at bedside Disposition Plan: patient  expected to be admitted greater than 2 midnights  Consults called: critical care Admission status:   ICU   Camila DELENA Ned MD [facility]   If 7PM-7AM, please contact night-coverage [email or web address] Password [email or web address]  01/11/2024, 8:26 PM        [1]  Allergies Allergen Reactions   Ceftin [Cefuroxime Axetil] Shortness Of Breath and Swelling   Cinobac [Cinoxacin] Shortness Of Breath and Swelling   Fire Ant Anaphylaxis   Sulfa Antibiotics Other (See Comments)    Unknown

## 2024-01-12 DIAGNOSIS — K859 Acute pancreatitis without necrosis or infection, unspecified: Secondary | ICD-10-CM | POA: Diagnosis not present

## 2024-01-12 DIAGNOSIS — E119 Type 2 diabetes mellitus without complications: Secondary | ICD-10-CM | POA: Diagnosis not present

## 2024-01-12 DIAGNOSIS — J189 Pneumonia, unspecified organism: Secondary | ICD-10-CM | POA: Diagnosis not present

## 2024-01-12 DIAGNOSIS — I2699 Other pulmonary embolism without acute cor pulmonale: Secondary | ICD-10-CM | POA: Diagnosis not present

## 2024-01-12 DIAGNOSIS — R6520 Severe sepsis without septic shock: Secondary | ICD-10-CM | POA: Diagnosis not present

## 2024-01-12 DIAGNOSIS — N179 Acute kidney failure, unspecified: Secondary | ICD-10-CM | POA: Diagnosis not present

## 2024-01-12 DIAGNOSIS — A419 Sepsis, unspecified organism: Secondary | ICD-10-CM | POA: Diagnosis not present

## 2024-01-12 LAB — CBC
HCT: 32 % — ABNORMAL LOW (ref 36.0–46.0)
Hemoglobin: 10.8 g/dL — ABNORMAL LOW (ref 12.0–15.0)
MCH: 29.3 pg (ref 26.0–34.0)
MCHC: 33.8 g/dL (ref 30.0–36.0)
MCV: 87 fL (ref 80.0–100.0)
Platelets: 331 K/uL (ref 150–400)
RBC: 3.68 MIL/uL — ABNORMAL LOW (ref 3.87–5.11)
RDW: 13.5 % (ref 11.5–15.5)
WBC: 21 K/uL — ABNORMAL HIGH (ref 4.0–10.5)
nRBC: 0 % (ref 0.0–0.2)

## 2024-01-12 LAB — GLUCOSE, CAPILLARY
Glucose-Capillary: 115 mg/dL — ABNORMAL HIGH (ref 70–99)
Glucose-Capillary: 150 mg/dL — ABNORMAL HIGH (ref 70–99)
Glucose-Capillary: 159 mg/dL — ABNORMAL HIGH (ref 70–99)
Glucose-Capillary: 160 mg/dL — ABNORMAL HIGH (ref 70–99)
Glucose-Capillary: 163 mg/dL — ABNORMAL HIGH (ref 70–99)
Glucose-Capillary: 165 mg/dL — ABNORMAL HIGH (ref 70–99)
Glucose-Capillary: 167 mg/dL — ABNORMAL HIGH (ref 70–99)
Glucose-Capillary: 167 mg/dL — ABNORMAL HIGH (ref 70–99)
Glucose-Capillary: 180 mg/dL — ABNORMAL HIGH (ref 70–99)
Glucose-Capillary: 183 mg/dL — ABNORMAL HIGH (ref 70–99)
Glucose-Capillary: 198 mg/dL — ABNORMAL HIGH (ref 70–99)
Glucose-Capillary: 199 mg/dL — ABNORMAL HIGH (ref 70–99)
Glucose-Capillary: 256 mg/dL — ABNORMAL HIGH (ref 70–99)

## 2024-01-12 LAB — APTT
aPTT: 75 s — ABNORMAL HIGH (ref 24–36)
aPTT: 56 s — ABNORMAL HIGH (ref 24–36)
aPTT: 86 s — ABNORMAL HIGH (ref 24–36)

## 2024-01-12 LAB — LIPID PANEL
Cholesterol: 140 mg/dL (ref 0–200)
HDL: 30 mg/dL — ABNORMAL LOW (ref >40)
LDL Cholesterol: 78 mg/dL (ref 0–99)
Total CHOL/HDL Ratio: 4.6 ratio
Triglycerides: 158 mg/dL — ABNORMAL HIGH (ref <150)
VLDL: 32 mg/dL (ref 0–40)

## 2024-01-12 LAB — COMPREHENSIVE METABOLIC PANEL WITH GFR
ALT: 13 U/L (ref 0–44)
AST: 29 U/L (ref 15–41)
Albumin: 2.9 g/dL — ABNORMAL LOW (ref 3.5–5.0)
Alkaline Phosphatase: 140 U/L — ABNORMAL HIGH (ref 38–126)
Anion gap: 11 (ref 5–15)
BUN: 5 mg/dL — ABNORMAL LOW (ref 6–20)
CO2: 21 mmol/L — ABNORMAL LOW (ref 22–32)
Calcium: 8.6 mg/dL — ABNORMAL LOW (ref 8.9–10.3)
Chloride: 107 mmol/L (ref 98–111)
Creatinine, Ser: 0.82 mg/dL (ref 0.44–1.00)
GFR, Estimated: >60 mL/min (ref >60)
Glucose, Bld: 203 mg/dL — ABNORMAL HIGH (ref 70–99)
Potassium: 3.4 mmol/L — ABNORMAL LOW (ref 3.5–5.1)
Sodium: 139 mmol/L (ref 135–145)
Total Bilirubin: 0.7 mg/dL (ref 0.0–1.2)
Total Protein: 5.7 g/dL — ABNORMAL LOW (ref 6.5–8.1)

## 2024-01-12 LAB — C-REACTIVE PROTEIN: CRP: 26.4 mg/dL — ABNORMAL HIGH (ref <1.0)

## 2024-01-12 LAB — SEDIMENTATION RATE: Sed Rate: 35 mm/h — ABNORMAL HIGH (ref 0–22)

## 2024-01-12 LAB — HEPARIN LEVEL (UNFRACTIONATED)
Heparin Unfractionated: 0.63 [IU]/mL (ref 0.30–0.70)
Heparin Unfractionated: 0.49 [IU]/mL (ref 0.30–0.70)

## 2024-01-12 LAB — LACTIC ACID, PLASMA
Lactic Acid, Venous: 2.2 mmol/L (ref 0.5–1.9)
Lactic Acid, Venous: 3 mmol/L (ref 0.5–1.9)

## 2024-01-12 LAB — HIV ANTIBODY (ROUTINE TESTING W REFLEX): HIV Screen 4th Generation wRfx: NONREACTIVE

## 2024-01-12 LAB — BETA-HYDROXYBUTYRIC ACID: Beta-Hydroxybutyric Acid: 0.22 mmol/L (ref 0.05–0.27)

## 2024-01-12 MED ORDER — OXYCODONE HCL 5 MG PO TABS
10.0000 mg | ORAL_TABLET | Freq: Four times a day (QID) | ORAL | Status: DC | PRN
  Administered 2024-01-12 – 2024-01-13 (×3): 10 mg via ORAL
  Filled 2024-01-12 (×3): qty 2

## 2024-01-12 MED ORDER — GABAPENTIN 400 MG PO CAPS
400.0000 mg | ORAL_CAPSULE | Freq: Three times a day (TID) | ORAL | Status: DC
  Administered 2024-01-12 – 2024-01-18 (×18): 400 mg via ORAL
  Filled 2024-01-12 (×19): qty 1

## 2024-01-12 MED ORDER — INSULIN ASPART 100 UNIT/ML IJ SOLN
0.0000 [IU] | Freq: Three times a day (TID) | INTRAMUSCULAR | Status: DC
  Administered 2024-01-12: 3 [IU] via SUBCUTANEOUS
  Administered 2024-01-12: 8 [IU] via SUBCUTANEOUS
  Filled 2024-01-12: qty 3
  Filled 2024-01-12: qty 5
  Filled 2024-01-12: qty 8

## 2024-01-12 MED ORDER — TIZANIDINE HCL 4 MG PO TABS
4.0000 mg | ORAL_TABLET | Freq: Four times a day (QID) | ORAL | Status: DC | PRN
  Administered 2024-01-13 – 2024-01-17 (×5): 4 mg via ORAL
  Filled 2024-01-12 (×5): qty 1

## 2024-01-12 MED ORDER — HYDROXYCHLOROQUINE SULFATE 200 MG PO TABS
300.0000 mg | ORAL_TABLET | Freq: Every day | ORAL | Status: DC
  Administered 2024-01-13 – 2024-01-18 (×6): 300 mg via ORAL
  Filled 2024-01-12 (×7): qty 1.5

## 2024-01-12 MED ORDER — VITAMIN B-12 1000 MCG PO TABS
1000.0000 ug | ORAL_TABLET | Freq: Every day | ORAL | Status: DC
  Administered 2024-01-13 – 2024-01-18 (×6): 1000 ug via ORAL
  Filled 2024-01-12 (×7): qty 1

## 2024-01-12 MED ORDER — SODIUM CHLORIDE 0.9 % IV SOLN
12.5000 mg | Freq: Four times a day (QID) | INTRAVENOUS | Status: DC | PRN
  Administered 2024-01-12 – 2024-01-13 (×2): 12.5 mg via INTRAVENOUS
  Filled 2024-01-12 (×2): qty 12.5

## 2024-01-12 MED ORDER — INSULIN ASPART 100 UNIT/ML IJ SOLN: 0.0000 [IU] | Freq: Every day | INTRAMUSCULAR | Status: DC

## 2024-01-12 MED ORDER — LORATADINE 10 MG PO TABS
10.0000 mg | ORAL_TABLET | Freq: Every day | ORAL | Status: DC
  Administered 2024-01-13 – 2024-01-18 (×6): 10 mg via ORAL
  Filled 2024-01-12 (×7): qty 1

## 2024-01-12 MED ORDER — LACTATED RINGERS IV BOLUS
500.0000 mL | Freq: Once | INTRAVENOUS | Status: AC
  Administered 2024-01-12: 500 mL via INTRAVENOUS

## 2024-01-12 MED ORDER — INSULIN GLARGINE-YFGN 100 UNIT/ML ~~LOC~~ SOLN
10.0000 [IU] | Freq: Every day | SUBCUTANEOUS | Status: DC
  Administered 2024-01-12: 10 [IU] via SUBCUTANEOUS
  Filled 2024-01-12 (×2): qty 0.1

## 2024-01-12 MED ORDER — METOPROLOL TARTRATE 5 MG/5ML IV SOLN
5.0000 mg | Freq: Once | INTRAVENOUS | Status: AC
  Administered 2024-01-12: 5 mg via INTRAVENOUS
  Filled 2024-01-12: qty 5

## 2024-01-12 MED ORDER — LEVOFLOXACIN IN D5W 750 MG/150ML IV SOLN
750.0000 mg | INTRAVENOUS | Status: DC
  Administered 2024-01-12 – 2024-01-15 (×4): 750 mg via INTRAVENOUS
  Filled 2024-01-12 (×4): qty 150

## 2024-01-12 MED ORDER — MONTELUKAST SODIUM 10 MG PO TABS
10.0000 mg | ORAL_TABLET | Freq: Every day | ORAL | Status: DC
  Administered 2024-01-12 – 2024-01-17 (×6): 10 mg via ORAL
  Filled 2024-01-12 (×6): qty 1

## 2024-01-12 MED ORDER — KETOROLAC TROMETHAMINE 15 MG/ML IJ SOLN
15.0000 mg | Freq: Four times a day (QID) | INTRAMUSCULAR | Status: AC | PRN
  Administered 2024-01-12 (×2): 15 mg via INTRAVENOUS
  Filled 2024-01-12 (×2): qty 1

## 2024-01-12 MED ORDER — DULOXETINE HCL 30 MG PO CPEP
120.0000 mg | ORAL_CAPSULE | Freq: Every day | ORAL | Status: DC
  Administered 2024-01-13 – 2024-01-18 (×6): 120 mg via ORAL
  Filled 2024-01-12 (×2): qty 6
  Filled 2024-01-12 (×5): qty 4

## 2024-01-12 MED ORDER — FOLIC ACID 1 MG PO TABS
1.0000 mg | ORAL_TABLET | Freq: Every day | ORAL | Status: DC
  Administered 2024-01-13 – 2024-01-18 (×6): 1 mg via ORAL
  Filled 2024-01-12 (×7): qty 1

## 2024-01-12 MED ORDER — ACETAMINOPHEN 10 MG/ML IV SOLN
1000.0000 mg | Freq: Four times a day (QID) | INTRAVENOUS | Status: AC | PRN
  Administered 2024-01-12 (×2): 1000 mg via INTRAVENOUS
  Filled 2024-01-12 (×2): qty 100

## 2024-01-12 NOTE — Progress Notes (Addendum)
 [facility] PROGRESS NOTE   Megan Weber [name] DOB: 1976/01/10 DOA: 01/11/2024  PCP: Pia Kerney SQUIBB, MD  Brief History: 48 y.o. 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  She was found to have pneumonia, sepsis, acute pulm embolism.  She was hospitalized for further management  Consultants: None  Procedures: Echocardiogram is pending    Subjective/Interval History: Patient mentions that she is feeling slightly better today compared to yesterday.  Still has some abdominal pain.  Had some vomiting last night but none this morning.  Some shortness of breath but no chest pain.  No dizziness or lightheadedness.    Assessment/Plan:  Multifocal pneumonia/sepsis, present on admission Patient had multiple episodes of emesis in the last few days.  Probably triggered by her acute pancreatitis.  She could have aspirated. Patient was started on broad-spectrum antibiotics.  Follow-up on blood cultures.  MRSA PCR is negative.  Will discontinue vancomycin .  Continue aztreonam  and levofloxacin . Respiratory status noted be stable.  Diabetes mellitus type 2, uncontrolled with hyperglycemia/concern for DKA Came in with a blood glucose of 505.  Her bicarbonate was 16.  Anion gap was 20. Patient mentioned that she typically does not use insulin  at home and is just on metformin. HbA1c 10.6. She was placed on IV insulin  with improvement in glucose levels.  Anion gap is closed.  She will be transition to subcutaneous insulin .  Diabetes coordinator to see.  Acute bilateral pulmonary embolism Patient with previous  history of DVT and has been on Xarelto  but she stopped taking it for about 10 days and then resumed a few days prior to this admission.  Could have contributed to her acute of VTE. Currently on heparin  infusion which will be continued.  Follow-up on echocardiogram.  Hemodynamically stable currently. Mildly elevated troponin level likely due to PE.  Follow-up on echocardiogram.  Acute pancreatitis Lipase was noted to be elevated.  Acute pancreatitis noted in pancreatic tail on CT scan. Reason for this is not entirely clear.  Does not admit to any alcohol use. Advance diet gradually.  Follow-up on lipase levels.  LFTs are noted to be normal.  Mildly elevated alkaline phosphatase noted but bilirubin is normal.  Continue to trend for now. Home medication list reviewed.  She is on numerous medications.  Noted to be on Abilify which can cause pancreatitis.  This will be held for now.  Mild acute kidney injury Resolved.  Hypokalemia Improved.  Continue to supplement.  Magnesium  1.7.  Type II Niemann-Pick disease This is managed by [facility]. Gets Xenopozyme infusions every month.  Cerebral aneurysm This is monitored by neurosurgery.  No acute issues currently.  Systemic mastocytosis/hypogammaglobinemia On immunotherapy at home.  Followed by [facility].  Obesity Estimated body mass index is 31.37 kg/m as calculated from the following:   Height as of this encounter: 5' 1 (1.549 m).   Weight as of this encounter: 75.3 kg.   DVT Prophylaxis: On IV heparin  Code Status: Full code Family Communication: Discussed with patient Disposition Plan: Hopefully return home when improved  Status is: Inpatient Remains inpatient appropriate because: IV antibiotics, IV heparin       Medications: Scheduled:  Chlorhexidine  Gluconate Cloth  6 each Topical Daily  insulin  aspart  0-15 Units Subcutaneous TID WC   insulin  aspart  0-5 Units Subcutaneous QHS   insulin  glargine-yfgn  10 Units Subcutaneous  Daily   ipratropium  0.5 mg Nebulization Q6H   lisinopril   2.5 mg Oral QHS   metoprolol  tartrate  12.5 mg Oral BID   pantoprazole   40 mg Oral Daily   Continuous:  acetaminophen  Stopped (01/12/24 0322)   aztreonam  Stopped (01/12/24 0651)   dextrose  5% lactated ringers  100 mL/hr at 01/12/24 0739   famotidine  (PEPCID ) IV Stopped (01/11/24 2251)   heparin  1,250 Units/hr (01/12/24 0914)   insulin  3.2 Units/hr (01/12/24 0739)   levofloxacin  (LEVAQUIN ) IV     PRN:acetaminophen , acetaminophen , dextrose , HYDROmorphone  (DILAUDID ) injection, ketorolac , labetalol , ondansetron  **OR** ondansetron  (ZOFRAN ) IV, mouth rinse  Antibiotics: Anti-infectives (From admission, onward)    Start     Dose/Rate Route Frequency Ordered Stop   01/12/24 1200  vancomycin  (VANCOREADY) IVPB 1500 mg/300 mL  Status:  Discontinued        1,500 mg 150 mL/hr over 120 Minutes Intravenous Every 24 hours 01/11/24 2100 01/12/24 0906   01/12/24 1000  levofloxacin  (LEVAQUIN ) IVPB 750 mg        750 mg 100 mL/hr over 90 Minutes Intravenous Every 24 hours 01/12/24 0908     01/12/24 0000  metroNIDAZOLE  (FLAGYL ) IVPB 500 mg  Status:  Discontinued        500 mg 100 mL/hr over 60 Minutes Intravenous Every 12 hours 01/11/24 2020 01/12/24 0907   01/11/24 2200  aztreonam  (AZACTAM ) 2 g in sodium chloride  0.9 % 100 mL IVPB        2 g 200 mL/hr over 30 Minutes Intravenous Every 8 hours 01/11/24 2020     01/11/24 1130  aztreonam  (AZACTAM ) 2 g in sodium chloride  0.9 % 100 mL IVPB        2 g 200 mL/hr over 30 Minutes Intravenous  Once 01/11/24 1127 01/11/24 1241   01/11/24 1130  metroNIDAZOLE  (FLAGYL ) IVPB 500 mg        500 mg 100 mL/hr over 60 Minutes Intravenous  Once 01/11/24 1127 01/11/24 1411   01/11/24 1130  vancomycin  (VANCOCIN ) IVPB 1000 mg/200 mL premix        1,000 mg 200 mL/hr over 60 Minutes Intravenous  Once 01/11/24 1127 01/11/24 1537       Objective:  Vital Signs  Vitals:   01/12/24 0635 01/12/24 0800 01/12/24  0831 01/12/24 0907  BP:  (!) 169/92    Pulse: 99 (!) 101    Resp: (!) 24 (!) 25    Temp:   98.1 F (36.7 C)   TempSrc:   Oral   SpO2: 94% 96%  95%  Weight:      Height:        Intake/Output Summary (Last 24 hours) at 01/12/2024 0923 Last data filed at 01/12/2024 0739 Gross per 24 hour  Intake 6309.44 ml  Output 3400 ml  Net 2909.44 ml   Filed Weights   01/11/24 1008  Weight: 75.3 kg    General appearance: Awake alert.  In no distress Resp: Tachypnea without any use of accessory muscles.  Coarse breath sounds with few crackles at the bases.  No wheezing or rhonchi. Cardio: S1-S2 is normal regular.  No S3-S4.  No rubs murmurs or bruit GI: Abdomen is soft.  Mildly tender diffusely especially in the epigastrium without any rebound rigidity or guarding.  No masses organomegaly. Extremities: No edema.  Full range of motion of lower extremities.  Neurologic: Alert and oriented x3.  No focal neurological deficits.    Lab Results:  Data Reviewed: I have personally reviewed following labs and reports of the imaging studies  CBC: Recent Labs  Lab 01/11/24 1054 01/11/24 1221 01/12/24 0554  WBC 21.0*  --  21.0*  NEUTROABS 16.7*  --   --   HGB 12.8 12.9 10.8*  HCT 37.3 38.0 32.0*  MCV 85.9  --  87.0  PLT 325  325  --  331    Basic Metabolic Panel: Recent Labs  Lab 01/11/24 1054 01/11/24 1221 01/11/24 1359 01/11/24 2109 01/12/24 0554  NA 128* 133* 136 135 139  K 3.3* 3.4* 3.4* 3.4* 3.4*  CL 92*  --  103 103 107  CO2 16*  --  17* 16* 21*  GLUCOSE 505*  --  293* 292* 203*  BUN 16  --  13 9 5*  CREATININE 1.28*  --  0.90 0.89 0.82  CALCIUM  9.1  --  7.9* 8.8* 8.6*  MG  --   --   --  1.7  --   PHOS  --   --   --  1.9*  --     GFR: Estimated Creatinine Clearance: 79.6 mL/min (by C-G formula based on SCr of 0.82 mg/dL).  Liver Function Tests: Recent Labs  Lab 01/11/24 1054 01/11/24 2109 01/12/24 0554  AST 36 34 29  ALT 18 16 13   ALKPHOS 157* 155* 140*   BILITOT 0.7 0.8 0.7  PROT 7.2 6.5 5.7*  ALBUMIN  3.7 3.3* 2.9*    Recent Labs  Lab 01/11/24 1054  LIPASE 151*   Coagulation Profile: Recent Labs  Lab 01/11/24 1054  INR 2.1*    HbA1C: Recent Labs    01/11/24 2109  HGBA1C 10.6*    CBG: Recent Labs  Lab 01/12/24 0447 01/12/24 0600 01/12/24 0659 01/12/24 0804 01/12/24 0905  GLUCAP 198* 180* 159* 163* 160*    Anemia Panel: Recent Labs    01/11/24 2232  FERRITIN 339*    Recent Results (from the past 240 hours)  Resp panel by RT-PCR (RSV, Flu A&B, Covid) Anterior Nasal Swab     Status: None   Collection Time: 01/11/24 10:14 AM   Specimen: Anterior Nasal Swab  Result Value Ref Range Status   SARS Coronavirus 2 by RT PCR NEGATIVE NEGATIVE Final    Comment: (NOTE) SARS-CoV-2 target nucleic acids are NOT DETECTED.  The SARS-CoV-2 RNA is generally detectable in upper respiratory specimens during the acute phase of infection. The lowest concentration of SARS-CoV-2 viral copies this assay can detect is 138 copies/mL. A negative result does not preclude SARS-Cov-2 infection and should not be used as the sole basis for treatment or other patient management decisions. A negative result may occur with  improper specimen collection/handling, submission of specimen other than nasopharyngeal swab, presence of viral mutation(s) within the areas targeted by this assay, and inadequate number of viral copies(<138 copies/mL). A negative result must be combined with clinical observations, patient history, and epidemiological information. The expected result is Negative.  Fact Sheet for Patients:  bloggercourse.com  Fact Sheet for Healthcare Providers:  seriousbroker.it  This test is no t yet approved or cleared by the [place]  FDA and  has been authorized for detection and/or diagnosis of SARS-CoV-2 by FDA under an Emergency Use Authorization (EUA). This EUA will  remain  in effect (meaning this test can be used) for the duration of the COVID-19 declaration under Section 564(b)(1) of the Act, 21  U.S.C.section 360bbb-3(b)(1), unless the authorization is terminated  or revoked sooner.       Influenza A by PCR NEGATIVE NEGATIVE Final   Influenza B by PCR NEGATIVE NEGATIVE Final    Comment: (NOTE) The Xpert Xpress SARS-CoV-2/FLU/RSV plus assay is intended as an aid in the diagnosis of influenza from Nasopharyngeal swab specimens and should not be used as a sole basis for treatment. Nasal washings and aspirates are unacceptable for Xpert Xpress SARS-CoV-2/FLU/RSV testing.  Fact Sheet for Patients: bloggercourse.com  Fact Sheet for Healthcare Providers: seriousbroker.it  This test is not yet approved or cleared by the [place]  FDA and has been authorized for detection and/or diagnosis of SARS-CoV-2 by FDA under an Emergency Use Authorization (EUA). This EUA will remain in effect (meaning this test can be used) for the duration of the COVID-19 declaration under Section 564(b)(1) of the Act, 21 U.S.C. section 360bbb-3(b)(1), unless the authorization is terminated or revoked.     Resp Syncytial Virus by PCR NEGATIVE NEGATIVE Final    Comment: (NOTE) Fact Sheet for Patients: bloggercourse.com  Fact Sheet for Healthcare Providers: seriousbroker.it  This test is not yet approved or cleared by the [place]  FDA and has been authorized for detection and/or diagnosis of SARS-CoV-2 by FDA under an Emergency Use Authorization (EUA). This EUA will remain in effect (meaning this test can be used) for the duration of the COVID-19 declaration under Section 564(b)(1) of the Act, 21 U.S.C. section 360bbb-3(b)(1), unless the authorization is terminated or revoked.  Performed at [facility], [address] [place], [place] [address]    MRSA Next Gen by PCR, Nasal     Status: None   Collection Time: 01/11/24  7:01 PM   Specimen: Nasal Mucosa; Nasal Swab  Result Value Ref Range Status   MRSA by PCR Next Gen NOT DETECTED NOT DETECTED Final    Comment: (NOTE) The GeneXpert MRSA Assay (FDA approved for NASAL specimens only), is one component of a comprehensive MRSA colonization surveillance program. It is not intended to diagnose MRSA infection nor to guide or monitor treatment for MRSA infections. Test performance is not FDA approved in patients less than 69 years old. Performed at [facility], [address] [place], [place] [address]   Culture, blood (routine x 2) Call MD if unable to obtain prior to antibiotics being given     Status: None (Preliminary result)   Collection Time: 01/11/24  9:09 PM   Specimen: BLOOD LEFT HAND  Result Value Ref Range Status   Specimen Description   Final    BLOOD LEFT HAND Performed at [facility], [address], [place], [place] [address]    Special Requests   Final    BOTTLES DRAWN AEROBIC ONLY Blood Culture results may not be optimal due to an inadequate volume of blood received in culture bottles Performed at [facility], [address] [place], [place] [address]    Culture PENDING  Incomplete   Report Status PENDING  Incomplete  Culture, blood (routine x 2) Call MD if unable to obtain prior to antibiotics being given     Status: None (Preliminary result)   Collection Time: 01/11/24  9:09 PM   Specimen: BLOOD LEFT HAND  Result Value Ref Range Status   Specimen Description   Final    BLOOD LEFT HAND Performed at [facility], [address] [place], [place] [address]    Special Requests   Final  BOTTLES DRAWN AEROBIC ONLY Blood Culture adequate volume Performed at [facility], [address], [place], [place] [address]    Culture PENDING  Incomplete   Report Status PENDING  Incomplete       Radiology Studies: CT CHEST ABDOMEN PELVIS W CONTRAST Result Date: 01/11/2024 EXAM: CT CHEST, ABDOMEN AND PELVIS WITH CONTRAST 01/11/2024 12:23:00 PM TECHNIQUE: CT of the chest, abdomen and pelvis was performed with the administration of intravenous contrast. Multiplanar reformatted images are provided for review. Automated exposure control, iterative reconstruction, and/or weight based adjustment of the mA/kV was utilized to reduce the radiation dose to as low as reasonably achievable. COMPARISON: CTA chest 03/15/2022, CT abdomen and pelvis 01/30/2017. CLINICAL HISTORY: 48 year old female, sepsis. FINDINGS: CHEST: MEDIASTINUM AND LYMPH NODES: Low density bilateral pulmonary emboli are present in the lobar branches, at the left main pulmonary artery bifurcation. No saddle embolus. Abundant bilateral lower lobes branch involvement. Heart and pericardium are unremarkable. The central airways are clear. No mediastinal, hilar or axillary lymphadenopathy. Right chest port a cath is new, no adverse features. No pericardial effusion. Negative thoracic aorta. LUNGS AND PLEURA: Multifocal peribronchial ground glass opacity, including in the posterior right upper lobe and streaky more confluent bilateral middle lobe and left lower lobe opacity which is not significantly enhancing. This area does not appear severely affected by pulmonary emboli. No pleural effusion. No large pulmonary infarct. No thoracic lymphadenopathy. Major airways are patent. ABDOMEN AND PELVIS: LIVER: Evidence of chronic hepatic steatosis. GALLBLADDER AND BILE DUCTS: Gallbladder is unremarkable. No biliary ductal dilatation. SPLEEN: Chronic splenectomy. PANCREAS: Chronic splenectomy with mild architectural distortion at the tail of the pancreas. However, peripancreatic inflammatory stranding on coronal image 51 is new compared to 2019 , and also was not apparent on the CTA Chest last year. Pancreatic enhancement is maintained. No ductal dilatation.  ADRENAL GLANDS: No acute abnormality. KIDNEYS, URETERS AND BLADDER: No stones in the kidneys or ureters. No hydronephrosis. No perinephric or periureteral stranding. Urinary bladder is unremarkable. GI AND BOWEL: Fluid throughout nondilated large and small bowel loops including fluid in the colon present to the rectum. Normal retrocecal appendix containing gas. No discrete bowel wall thickening, although generalized mucosal hyperenhancement is possible. Nondilated stomach and duodenum. REPRODUCTIVE ORGANS: No acute abnormality. PERITONEUM AND RETROPERITONEUM: No ascites. No free air. Subtle but asymmetric low density within the left common femoral vein. VASCULATURE: Aorta is normal in caliber. Abdominal aortic calcified atherosclerosis. Major arterial structures and portal venous system appear to be patent. ABDOMINAL AND PELVIS LYMPH NODES: No lymphadenopathy. BONES AND SOFT TISSUES: Chronic disc degeneration at the lumbosacral junction with vacuum disc. No acute osseous abnormality. No focal soft tissue abnormality. IMPRESSION: 1. Acute bilateral pulmonary emboli including some lobar artery involvement (RV/LV ratio 0.71, no obvious heart strain). Possible left common femoral vein deep venous thrombosis; Ultrasound would be necessary to confirm. 2. Superimposed multifocal peribronchial and streaky lung opacity, more consistent with bronchopneumonia/infection than pulmonary infarction. No pleural effusion. 3. Evidence of Acute Pancreatitis limited to the pancreatic tail. 4. Fluid and possible mucosal hyperenhancement throughout nondilated small and large bowel loops, suggesting acute enteritis / diarrhea. Normal appendix. 5. Chronically absent spleen. Chronic hepatic steatosis, calcified aortic atherosclerosis. 6. Salient findings discussed discussed by telephone with Dr. Yolande in the ED at 1244 hours. Electronically signed by: Helayne Hurst MD 01/11/2024 12:47 PM EST RP Workstation: [id number]       LOS: 1 day    Joette Pebbles  [facility] Pager on [email or web address]  01/12/2024,  9:23 AM

## 2024-01-12 NOTE — Progress Notes (Signed)
 PHARMACY - ANTICOAGULATION CONSULT NOTE  Pharmacy Consult for heparin  Indication: pulmonary embolus  Allergies[1]  Patient Measurements: Height: 5' 1 (154.9 cm) Weight: 75.3 kg (166 lb) IBW/kg (Calculated) : 47.8 HEPARIN  DW (KG): 64.4  Vital Signs: Temp: 98.8 F (37.1 C) (12/13 2317) Temp Source: Oral (12/13 2317) BP: 164/97 (12/13 1709) Pulse Rate: 108 (12/13 1709)  Labs: Recent Labs    01/11/24 1054 01/11/24 1221 01/11/24 1359 01/11/24 2109 01/11/24 2232 01/12/24 0554 01/12/24 1302 01/12/24 2320  HGB 12.8 12.9  --   --   --  10.8*  --   --   HCT 37.3 38.0  --   --   --  32.0*  --   --   PLT 325  325  --   --   --   --  331  --   --   APTT 43*  --   --   --  48* 75* 56* 86*  LABPROT 24.6*  --   --   --   --   --   --   --   INR 2.1*  --   --   --   --   --   --   --   HEPARINUNFRC  --   --   --   --  >1.10* 0.63 0.49  --   CREATININE 1.28*  --  0.90 0.89  --  0.82  --   --     Estimated Creatinine Clearance: 79.6 mL/min (by C-G formula based on SCr of 0.82 mg/dL).   Medical History: Past Medical History:  Diagnosis Date   Abnormal glucose    Adjustment disorder    Asplenia    Cerebral aneurysm    Cerebral degeneration associated with generalized lipidosis    Depression    Diabetes mellitus without complication (HCC)    Hypertension    OCD (obsessive compulsive disorder)    Peripheral neuropathy     Medications:  Infusions:   aztreonam  Stopped (01/12/24 2350)   famotidine  (PEPCID ) IV Stopped (01/12/24 2236)   heparin  1,350 Units/hr (01/12/24 2354)   levofloxacin  (LEVAQUIN ) IV Stopped (01/12/24 1304)   promethazine  (PHENERGAN ) injection (IM or IVPB) Stopped (01/12/24 1126)    Assessment: 48 yof presented to the ED with CP and vomiting. Found to have bilateral PE's and possible DVT. To start IV heparin . Pt is on chronic low dose xarelto  10mg . Baseline CBC is ok. Will proceed with heparin  bolus since PTA xarelto  is not a therapeutic dose. Will  monitor with aPTT's initially since the xarelto  may impact the heparin  level.   Today, 01/12/2024 13:02 heparin  level = 0.49 and aPTT 56 (sub-therapeutic)  Heparin  level not yet correlating with aPTT CBC: hgb down to 10.8 (likely dilutional) and PLT WNL No bleeding or complications of therapy per RN; only disruption in infusion was about 5 minutes while cleaning patient  2320 aPTT 86, therapeutic Per RN no bleeding  Goal of Therapy:  Heparin  level 0.3-0.7 units/ml aPTT 66-103 seconds Monitor platelets by anticoagulation protocol: Yes   Plan:  Continue IV heparin  at 1350 units/hr Check 6-8 hr heparin  level and aPTT Monitor daily heparin  level, CBC, signs/symptoms of bleeding   Thank you for allowing pharmacy to be a part of this patients care.  Leeroy Mace RPh 01/12/2024, 11:55 PM           [1]  Allergies Allergen Reactions   Ceftin [Cefuroxime Axetil] Shortness Of Breath and Swelling   Cinobac [Cinoxacin] Shortness Of Breath and  Swelling    Has received Levaquin  in inpt & outpt setting   Fire Ant Anaphylaxis   Sulfa Antibiotics Other (See Comments)    Unknown

## 2024-01-12 NOTE — Plan of Care (Signed)
°  Problem: Health Behavior/Discharge Planning: Goal: Ability to manage health-related needs will improve Outcome: Progressing   Problem: Clinical Measurements: Goal: Diagnostic test results will improve Outcome: Progressing Goal: Respiratory complications will improve Outcome: Progressing Goal: Cardiovascular complication will be avoided Outcome: Progressing   Problem: Elimination: Goal: Will not experience complications related to urinary retention Outcome: Progressing

## 2024-01-12 NOTE — Consult Note (Signed)
 NAME:  Megan Weber, MRN:  [id number], DOB:  [birth year], LOS: 1 ADMISSION DATE:  01/11/2024, CONSULTATION DATE:  01/12/2024 REFERRING MD:  [facility], CHIEF COMPLAINT:  medical complexity?   History of Present Illness:  Mrs. 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 PCCM consulted for evaluation and management.  Patient reports feeling poorly for the past few days with nausea, vomiting, fevers, chills, body aches, chest tightness, and coughing. She was found to have pneumonia. 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 She has not had pneumonia previously.  Pertinent  Medical History   Past Medical History:  Diagnosis Date   Abnormal glucose    Adjustment disorder    Asplenia    Cerebral aneurysm    Cerebral degeneration associated with generalized lipidosis    Depression    Diabetes mellitus without complication (HCC)    Hypertension    OCD (obsessive compulsive disorder)    Peripheral neuropathy    Significant Hospital Events: Including procedures, antibiotic start and stop dates in addition to other pertinent events   12/13 --> admitted from [facility] to [facility] in [facility] stepdown  Interim History / Subjective:  Still experiencing nausea, vomiting, and abdominal pain.  Objective    Blood pressure (!) 170/88, pulse 98, temperature 98.1 F (36.7 C), temperature source Oral, resp. rate 18, height 5' 1 (1.549 m), weight 75.3 kg, SpO2 96%.        Intake/Output Summary (Last 24 hours) at 01/12/2024 1248 Last data filed at 01/12/2024 1200 Gross per 24 hour  Intake 5854.3 ml  Output 4100 ml  Net 1754.3 ml   Filed  Weights   01/11/24 1008  Weight: 75.3 kg    Examination: General: appears slightly anxious, tearful, no distress HENT: anicteric sclera, well injected conjunctivae, oral and nasal mucosa intact Lungs: inspiratory crackles worse on R than L on posterior auscultation Cardiovascular: tachycardic, no murmur audible Abdomen: soft, tender to palpation in epigastrium Extremities: no edema Neuro: A&O x 3, no focal deficits grossly GU: deferred   Resolved problem list   Assessment and Plan   #Low risk PE: > Patient unable to tolerate PO due to pancreatitis. Has not been on Xarelto  for a few days due to that. Likely not treatment failure - Agree with heparin  drip. - Once N/V improved, likely can resume Xarelto  - Wean O2 as tolerated - TTE to look for RV strain  #Community Acquired Pneumonia: > Patient at risk due to immunodeficiency; MRSA swab negative, RVP negative, Blood culture pending. - Levofloxacin  monotherapy is likely enough - Consider d/c Aztreonam . - MRSA swab negative, no need for Vancomycin . - FU blood and sputum culture data  #Acute Pancreatitis: > Unclear etiology. No stones on CT ABD/PLV. Not sure if any of her medications maybe a culprit. - Order lipid panel to rule out severe hypertriglyceridemia - IVF resuscitation - Symptomatic management with Zofran  as needed - Early enteral nutrition, low in fat, when able - Consider GI consult  #DM: A1c 10.6 > Currently on insulin  drip for unclear reason. - Consider transitioning to basal and SSI per primary medical team  Disposition: can likely transfer to progressive care unit.  PCCM will sign off.  If patient decompensates and requires vasopressors or have any ICU indication, please re-consult.  Patient Lines/Drains/Airways Status     Active Line/Drains/Airways     Name Placement date Placement time Site Days   Implanted Port 01/11/24 Right Chest Single Power 01/11/24  1429  Chest  1   Peripheral IV 01/11/24 22 G  1.75 Anterior;Left Forearm 01/11/24  2312  Forearm  1   Peripheral IV 01/11/24 20 G 1.88 Anterior;Right Forearm 01/11/24  2313  Forearm  1   External Urinary Catheter 01/11/24  1903  --  1            Labs   CBC: Recent Labs  Lab 01/11/24 1054 01/11/24 1221 01/12/24 0554  WBC 21.0*  --  21.0*  NEUTROABS 16.7*  --   --   HGB 12.8 12.9 10.8*  HCT 37.3 38.0 32.0*  MCV 85.9  --  87.0  PLT 325  325  --  331    Basic Metabolic Panel: Recent Labs  Lab 01/11/24 1054 01/11/24 1221 01/11/24 1359 01/11/24 2109 01/12/24 0554  NA 128* 133* 136 135 139  K 3.3* 3.4* 3.4* 3.4* 3.4*  CL 92*  --  103 103 107  CO2 16*  --  17* 16* 21*  GLUCOSE 505*  --  293* 292* 203*  BUN 16  --  13 9 5*  CREATININE 1.28*  --  0.90 0.89 0.82  CALCIUM  9.1  --  7.9* 8.8* 8.6*  MG  --   --   --  1.7  --   PHOS  --   --   --  1.9*  --    GFR: Estimated Creatinine Clearance: 79.6 mL/min (by C-G formula based on SCr of 0.82 mg/dL). Recent Labs  Lab 01/11/24 1054 01/11/24 1342 01/11/24 2109 01/11/24 2232 01/12/24 0332 01/12/24 0554  PROCALCITON  --   --  0.36  --   --   --   WBC 21.0*  --   --   --   --  21.0*  LATICACIDVEN 5.1*   < > 3.0* 3.6* 2.2* 3.0*   < > = values in this interval not displayed.    Liver Function Tests: Recent Labs  Lab 01/11/24 1054 01/11/24 2109 01/12/24 0554  AST 36 34 29  ALT 18 16 13   ALKPHOS 157* 155* 140*  BILITOT 0.7 0.8 0.7  PROT 7.2 6.5 5.7*  ALBUMIN  3.7 3.3* 2.9*   Recent Labs  Lab 01/11/24 1054  LIPASE 151*   No results for input(s): AMMONIA in the last 168 hours.  ABG    Component Value Date/Time   PHART 7.43 01/11/2024 2305   PCO2ART 28 (L) 01/11/2024 2305   PO2ART 81 (L) 01/11/2024 2305   HCO3 18.6 (L) 01/11/2024 2305   TCO2 20 (L) 01/11/2024 1221   ACIDBASEDEF 4.6 (H) 01/11/2024 2305   O2SAT 98.3 01/11/2024 2305     Coagulation Profile: Recent Labs  Lab 01/11/24 1054  INR 2.1*    Cardiac Enzymes: No results for  input(s): CKTOTAL, CKMB, CKMBINDEX, TROPONINI in the last 168 hours.  HbA1C: Hgb A1c MFr Bld  Date/Time Value Ref Range Status  01/11/2024 09:09 PM 10.6 (H) 4.8 - 5.6 % Final    Comment:    (NOTE) Diagnosis of Diabetes The following HbA1c ranges recommended by the American Diabetes Association (ADA) may be used as an aid in the diagnosis of diabetes mellitus.  Hemoglobin  Suggested A1C NGSP%              Diagnosis  <5.7                   Non Diabetic  5.7-6.4                Pre-Diabetic  >6.4                   Diabetic  <7.0                   Glycemic control for                       adults with diabetes.      CBG: Recent Labs  Lab 01/12/24 0659 01/12/24 0804 01/12/24 0905 01/12/24 1110 01/12/24 1219  GLUCAP 159* 163* 160* 167* 165*    Review of Systems:   Not obtained.  Past Medical History:  She,  has a past medical history of Abnormal glucose, Adjustment disorder, Asplenia, Cerebral aneurysm, Cerebral degeneration associated with generalized lipidosis, Depression, Diabetes mellitus without complication (HCC), Hypertension, OCD (obsessive compulsive disorder), and Peripheral neuropathy.   Surgical History:   Past Surgical History:  Procedure Laterality Date   SPLENECTOMY     SPLENECTOMY       Social History:   reports that she has never smoked. She has never used smokeless tobacco. She reports that she does not drink alcohol and does not use drugs.   Family History:  Her family history includes Diabetes Mellitus II in her father.   Allergies Allergies[1]   Home Medications  Prior to Admission medications  Medication Sig Start Date End Date Taking? Authorizing Provider  ABILIFY 5 MG tablet Take 5 mg by mouth daily. 06/26/23  Yes [provider]  acetaminophen  (TYLENOL ) 325 MG tablet Take 325 mg by mouth every 6 (six) hours as needed for mild pain (pain score 1-3) or moderate pain (pain score 4-6). 01/07/24  Yes [provider]  albuterol  (VENTOLIN  HFA) 108 (90 Base) MCG/ACT inhaler INHALE 1 PUFF EVERY 4 HOURS as NEEDED; Duration: 30 days 10/23/23  Yes [provider]  atorvastatin  (LIPITOR) 40 MG tablet Take 40 mg by mouth at bedtime. 03/02/17  Yes [provider]  azelastine (ASTELIN) 0.1 % nasal spray INSTILL ONE SPRAY INTO BOTH NOSTRILS TWICE DAILY AS DIRECTED 12/06/21  Yes [provider]  calcium -vitamin D  (OSCAL WITH D) 500-200 MG-UNIT tablet Take 1 tablet by mouth 2 (two) times daily.   Yes [provider]  citalopram  (CELEXA ) 40 MG tablet Take 40 mg by mouth daily after breakfast.    Yes [provider]  clonazePAM  (KLONOPIN ) 0.5 MG tablet Take 0.5 mg by mouth 3 (three) times daily as needed for anxiety.    Yes [provider]  Cranberry 500 MG CAPS Take 500 mg by mouth at bedtime.   Yes [provider]  DULoxetine  (CYMBALTA ) 60 MG capsule Take 120 mg by mouth daily. 12/07/20  Yes [provider]  EPINEPHrine  (EPIPEN  2-PAK) 0.3 mg/0.3 mL IJ SOAJ injection Inject 0.3 mg into the muscle daily as needed (allergic reaction).   Yes [provider]  estradiol (ESTRACE) 0.1 MG/GM vaginal cream Place 1 Applicatorful vaginally 2 (two) times a week. 03/25/21  Yes [provider]  ferrous sulfate  325 (65 FE) MG tablet Take 325 mg by mouth daily with breakfast.   Yes [provider]  fluconazole (DIFLUCAN) 200  MG tablet Take 200 mg by mouth daily.   Yes [provider]  fluticasone (FLONASE) 50 MCG/ACT nasal spray Place 2 sprays into the nose daily as needed for allergies.    Yes [provider]  folic acid  (FOLVITE ) 1 MG tablet Take 1 mg by mouth daily after breakfast.    Yes [provider]  gabapentin  (NEURONTIN ) 800 MG tablet Take 800 mg by mouth 3 (three) times daily.   Yes [provider]  GERI-DRYL 25 MG tablet Take 25 mg by mouth as directed.   Yes [provider]   glucagon Emergency 1 MG SOLR Inject as directed at bedtime as needed   Yes [provider]  Heparin  Na, Pork, Lock Flsh PF 100 UNIT/ML SOLN SMARTSIG:5 Milliliter(s) PRN 01/07/24  Yes [provider]  Hydroxychloroquine  Sulfate 100 MG TABS Take 300 mg by mouth daily.   Yes [provider]  hydrOXYzine (ATARAX) 25 MG tablet Take 25 mg by mouth every 8 (eight) hours as needed for anxiety. 02/06/22  Yes [provider]  loratadine  (CLARITIN ) 10 MG tablet Take 10 mg by mouth daily.   Yes [provider]  meclizine (ANTIVERT) 25 MG tablet Take 25-50 mg by mouth as needed for Dizziness   Yes [provider]  medroxyPROGESTERone Acetate 150 MG/ML SUSY Inject 150 mg as directed every 3 (three) months. 03/31/17  Yes [provider]  metFORMIN (GLUCOPHAGE) 1000 MG tablet Take 1,000 mg by mouth 2 (two) times daily. 03/02/17  Yes [provider]  metoprolol  tartrate (LOPRESSOR ) 25 MG tablet Take 12.5 mg by mouth 2 (two) times daily. 03/02/17  Yes [provider]  Multiple Vitamin (MULTI-VITAMIN) tablet Take 2 tablets by mouth daily.   Yes [provider]  naloxone (NARCAN) nasal spray 4 mg/0.1 mL INSTILL ONE SPRAY INTO NOSTRIL AT SIGNS OF OVERDOSE   Yes [provider]  OCTAGAM 30 GM/300ML SOLN INFUSE 30g INTRAVENOUSLY EVERY 4 WEEKS   Yes [provider]  omeprazole (PRILOSEC) 20 MG capsule Take 20 mg by mouth at bedtime.  03/02/17  Yes [provider]  ondansetron  (ZOFRAN ) 8 MG tablet Take 8 mg by mouth every 8 (eight) hours as needed for nausea or vomiting.   Yes [provider]  ondansetron  (ZOFRAN -ODT) 8 MG disintegrating tablet Take 8 mg by mouth every 8 (eight) hours as needed for nausea or vomiting.   Yes [provider]  Oxycodone  HCl 10 MG TABS Take 10 mg by mouth 4 (four) times daily as needed (Pain).   Yes [provider]  OZEMPIC, 2 MG/DOSE, 8 MG/3ML SOPN INJECT 2MG   UNDER THE SKIN ONCE WEEKLY Subcutaneous weekly; Duration: 30 days 03/09/22  Yes [provider]  SINGULAIR  10 MG tablet Take 10 mg by mouth at bedtime. 08/30/20  Yes [provider]  tiZANidine  (ZANAFLEX ) 4 MG tablet Take 4 mg by mouth every 6 (six) hours as needed for muscle spasms.  03/02/17  Yes [provider]  vitamin B-12 (CYANOCOBALAMIN ) 1000 MCG tablet Take 1,000 mcg by mouth daily after breakfast.    Yes [provider]  vitamin C  (ASCORBIC ACID ) 500 MG tablet Take 500 mg by mouth daily after breakfast.    Yes [provider]  XARELTO  10 MG TABS tablet Take 10 mg by mouth daily. 11/27/19  Yes [provider]  XENPOZYME 20 MG SOLR as directed Intravenous 10/09/23  Yes [provider]  XENPOZYME 4 MG SOLR Inject into the vein.  Yes [provider]  amLODipine (NORVASC) 5 MG tablet Take 5 mg by mouth daily. Patient not taking: Reported on 01/12/2024 11/15/23   [provider]  desipramine  (NOPRAMIN) 10 MG tablet Take 10 mg by mouth at bedtime.  Patient not taking: Reported on 01/12/2024 03/30/17   [provider]  dicyclomine  (BENTYL ) 10 MG capsule Take 1 capsule (10 mg total) by mouth 3 (three) times daily before meals for 5 days. Patient not taking: Reported on 01/12/2024 04/10/17 04/15/17  Jillian Buttery, MD  levofloxacin  (LEVAQUIN ) 750 MG tablet Take 1 tablet (750 mg total) by mouth daily. Patient not taking: Reported on 01/12/2024 04/11/17   Jillian Buttery, MD  lidocaine -prilocaine (EMLA) cream Apply 1 Application topically as needed [facility] access).    [provider]  lisinopril  (PRINIVIL ,ZESTRIL ) 2.5 MG tablet Take 2.5 mg by mouth at bedtime.  Patient not taking: Reported on 01/12/2024 03/02/17   [provider]  meloxicam (MOBIC) 7.5 MG tablet Take 7.5 mg by mouth daily as needed for pain.  Patient not taking: Reported on 01/12/2024 03/02/17   [provider]  methylPREDNISolone  (MEDROL) 8 MG tablet Take 80 mg by mouth as directed. On days she has infusions    [provider]  nitrofurantoin, macrocrystal-monohydrate, (MACROBID) 100 MG capsule Take 100 mg by mouth 2 (two) times daily. Patient not taking: Reported on 01/12/2024 04/05/17   [provider]  pramipexole (MIRAPEX) 0.5 MG tablet 1 tablet Orally Once a day; Duration: 90 days 07/23/23   [provider]     Thank you for this interesting consult. I have spent 55 minutes evaluating patient, reviewing chart, and discussing plan of care with patient, family, and primary medical team. If you have any questions or concerns please reach out to me via secure chat.  Paula Southerly, MD [facility]      [1]  Allergies Allergen Reactions   Ceftin [Cefuroxime Axetil] Shortness Of Breath and Swelling   Cinobac [Cinoxacin] Shortness Of Breath and Swelling    Has received Levaquin  in inpt & outpt setting   Fire Ant Anaphylaxis   Sulfa Antibiotics Other (See Comments)    Unknown

## 2024-01-12 NOTE — Progress Notes (Signed)
 PHARMACY - ANTICOAGULATION CONSULT NOTE  Pharmacy Consult for heparin  Indication: pulmonary embolus  Allergies[1]  Patient Measurements: Height: 5' 1 (154.9 cm) Weight: 75.3 kg (166 lb) IBW/kg (Calculated) : 47.8 HEPARIN  DW (KG): 64.4  Vital Signs: Temp: 97.9 F (36.6 C) (12/13 1200) Temp Source: Oral (12/13 1200) BP: 172/94 (12/13 1259) Pulse Rate: 103 (12/13 1300)  Labs: Recent Labs    01/11/24 1054 01/11/24 1221 01/11/24 1359 01/11/24 2109 01/11/24 2232 01/12/24 0554 01/12/24 1302  HGB 12.8 12.9  --   --   --  10.8*  --   HCT 37.3 38.0  --   --   --  32.0*  --   PLT 325  325  --   --   --   --  331  --   APTT 43*  --   --   --  48* 75* 56*  LABPROT 24.6*  --   --   --   --   --   --   INR 2.1*  --   --   --   --   --   --   HEPARINUNFRC  --   --   --   --  >1.10* 0.63 0.49  CREATININE 1.28*  --  0.90 0.89  --  0.82  --     Estimated Creatinine Clearance: 79.6 mL/min (by C-G formula based on SCr of 0.82 mg/dL).   Medical History: Past Medical History:  Diagnosis Date   Abnormal glucose    Adjustment disorder    Asplenia    Cerebral aneurysm    Cerebral degeneration associated with generalized lipidosis    Depression    Diabetes mellitus without complication (HCC)    Hypertension    OCD (obsessive compulsive disorder)    Peripheral neuropathy     Medications:  Infusions:   aztreonam  Stopped (01/12/24 0651)   dextrose  5% lactated ringers  Stopped (01/12/24 1219)   famotidine  (PEPCID ) IV Stopped (01/12/24 1046)   heparin  1,250 Units/hr (01/12/24 1308)   levofloxacin  (LEVAQUIN ) IV Stopped (01/12/24 1304)   promethazine  (PHENERGAN ) injection (IM or IVPB) Stopped (01/12/24 1126)    Assessment: 48 yof presented to the ED with CP and vomiting. Found to have bilateral PE's and possible DVT. To start IV heparin . Pt is on chronic low dose xarelto  10mg . Baseline CBC is ok. Will proceed with heparin  bolus since PTA xarelto  is not a therapeutic dose. Will  monitor with aPTT's initially since the xarelto  may impact the heparin  level.   Today, 01/12/2024 13:02 heparin  level = 0.49 and aPTT 56 (sub-therapeutic)  Heparin  level not yet correlating with aPTT CBC: hgb down to 10.8 (likely dilutional) and PLT WNL No bleeding or complications of therapy per RN; only disruption in infusion was about 5 minutes while cleaning patient  Goal of Therapy:  Heparin  level 0.3-0.7 units/ml aPTT 66-103 seconds Monitor platelets by anticoagulation protocol: Yes   Plan:  Increase IV heparin  to 1350 units/hr Check 6-8 hr heparin  level and aPTT Monitor daily heparin  level, CBC, signs/symptoms of bleeding   Thank you for allowing pharmacy to be a part of this patients care.  Eleanor EMERSON Agent, PharmD, BCPS Clinical Pharmacist [place] 01/12/2024 1:43 PM          [1]  Allergies Allergen Reactions   Ceftin [Cefuroxime Axetil] Shortness Of Breath and Swelling   Cinobac [Cinoxacin] Shortness Of Breath and Swelling    Has received Levaquin  in inpt & outpt setting   Fire Ant Anaphylaxis  Sulfa Antibiotics Other (See Comments)    Unknown

## 2024-01-12 NOTE — Progress Notes (Signed)
 eLink Physician-Brief Progress Note Patient Name: Rosemaria Inabinet DOB: Jul 15, 1975 MRN: [id number]   Date of Service  01/12/2024  HPI/Events of Note  Patient admitted with pneumonia, sepsis, enteritis and bilateral PE. Work up and Rx in progress.  eICU Interventions  New Patient Evaluation.        Travus Oren U Ronne Savoia 01/12/2024, 1:05 AM

## 2024-01-12 NOTE — Progress Notes (Signed)
 PHARMACY - ANTICOAGULATION CONSULT NOTE  Pharmacy Consult for heparin  Indication: pulmonary embolus  Allergies[1]  Patient Measurements: Height: 5' 1 (154.9 cm) Weight: 75.3 kg (166 lb) IBW/kg (Calculated) : 47.8 HEPARIN  DW (KG): 64.4  Vital Signs: Temp: 98.2 F (36.8 C) (12/13 0342) Temp Source: Oral (12/13 0342) BP: 170/84 (12/13 0600) Pulse Rate: 99 (12/13 0635)  Labs: Recent Labs    01/11/24 1054 01/11/24 1221 01/11/24 1359 01/11/24 2109 01/11/24 2232 01/12/24 0554  HGB 12.8 12.9  --   --   --  10.8*  HCT 37.3 38.0  --   --   --  32.0*  PLT 325  325  --   --   --   --  331  APTT 43*  --   --   --  48* 75*  LABPROT 24.6*  --   --   --   --   --   INR 2.1*  --   --   --   --   --   HEPARINUNFRC  --   --   --   --  >1.10* 0.63  CREATININE 1.28*  --  0.90 0.89  --  0.82    Estimated Creatinine Clearance: 79.6 mL/min (by C-G formula based on SCr of 0.82 mg/dL).   Medical History: Past Medical History:  Diagnosis Date   Abnormal glucose    Adjustment disorder    Asplenia    Cerebral aneurysm    Cerebral degeneration associated with generalized lipidosis    Depression    Diabetes mellitus without complication (HCC)    Hypertension    OCD (obsessive compulsive disorder)    Peripheral neuropathy     Medications:  Infusions:   acetaminophen  Stopped (01/12/24 0322)   aztreonam  Stopped (01/12/24 9348)   dextrose  5% lactated ringers  100 mL/hr at 01/12/24 0739   famotidine  (PEPCID ) IV Stopped (01/11/24 2251)   heparin  1,250 Units/hr (01/12/24 0739)   insulin  3.2 Units/hr (01/12/24 0739)   metronidazole  Stopped (01/12/24 0032)   vancomycin       Assessment: Megan Weber presented to the ED with CP and vomiting. Found to have bilateral PE's and possible DVT. To start IV heparin . Pt is on chronic low dose xarelto  10mg . Baseline CBC is ok. Will proceed with heparin  bolus since PTA xarelto  is not a therapeutic dose. Will monitor with aPTT's initially since the  xarelto  may impact the heparin  level.   Today, 01/12/2024 05:54 heparin  level = 0.63 and aPTT 75, therapeutic with IV heparin  infusing at 1100 units/hr Heparin  level beginning to correlate with aPTT CBC: hgb down to 10.8 (likely dilutional) and PLT WNL No bleeding or complications of therapy per RN  Goal of Therapy:  Heparin  level 0.3-0.7 units/ml aPTT 66-103 seconds Monitor platelets by anticoagulation protocol: Yes   Plan:  Continue IV heparin  at 1250 units/hr Check a confirmatory 6 hr heparin  level and aPTT Monitor daily heparin  level, CBC, signs/symptoms of bleeding   Thank you for allowing pharmacy to be a part of this patients care.  Eleanor EMERSON Agent, PharmD, BCPS Clinical Pharmacist [place] 01/12/2024 8:06 AM         [1]  Allergies Allergen Reactions   Ceftin [Cefuroxime Axetil] Shortness Of Breath and Swelling   Cinobac [Cinoxacin] Shortness Of Breath and Swelling   Fire Ant Anaphylaxis   Sulfa Antibiotics Other (See Comments)    Unknown

## 2024-01-12 NOTE — Progress Notes (Signed)
 eLink Physician-Brief Progress Note Patient Name: Megan Weber DOB: 1976/01/18 MRN: [id number]   Date of Service  01/12/2024  HPI/Events of Note  Patient with abdominal pain consistent with her presenting illness. Abdomen is soft and without significant guarding. Lactic acid 3.6.  eICU Interventions  LR 500 ml iv fluid bolus x 1,  PRN iv Tylenol  and Toradol  added to iv Dilaudid  for multi-modal pain control. Strict NPO to prevent exacerbation of acute pancreatitis.        Megan Weber U Nassir Neidert 01/12/2024, 2:47 AM

## 2024-01-13 ENCOUNTER — Inpatient Hospital Stay (HOSPITAL_COMMUNITY)

## 2024-01-13 LAB — COMPREHENSIVE METABOLIC PANEL WITH GFR
ALT: 13 U/L (ref 0–44)
AST: 17 U/L (ref 15–41)
Albumin: 2.9 g/dL — ABNORMAL LOW (ref 3.5–5.0)
Alkaline Phosphatase: 147 U/L — ABNORMAL HIGH (ref 38–126)
Anion gap: 11 (ref 5–15)
BUN: 7 mg/dL (ref 6–20)
CO2: 24 mmol/L (ref 22–32)
Calcium: 8.5 mg/dL — ABNORMAL LOW (ref 8.9–10.3)
Chloride: 103 mmol/L (ref 98–111)
Creatinine, Ser: 0.67 mg/dL (ref 0.44–1.00)
GFR, Estimated: >60 mL/min (ref >60)
Glucose, Bld: 252 mg/dL — ABNORMAL HIGH (ref 70–99)
Potassium: 3.2 mmol/L — ABNORMAL LOW (ref 3.5–5.1)
Sodium: 138 mmol/L (ref 135–145)
Total Bilirubin: 0.5 mg/dL (ref 0.0–1.2)
Total Protein: 5.8 g/dL — ABNORMAL LOW (ref 6.5–8.1)

## 2024-01-13 LAB — ECHOCARDIOGRAM COMPLETE
Area-P 1/2: 7.44 cm2
Height: 61 in
S' Lateral: 2.4 cm
Weight: 2656 [oz_av]

## 2024-01-13 LAB — GLUCOSE, CAPILLARY
Glucose-Capillary: 226 mg/dL — ABNORMAL HIGH (ref 70–99)
Glucose-Capillary: 202 mg/dL — ABNORMAL HIGH (ref 70–99)
Glucose-Capillary: 111 mg/dL — ABNORMAL HIGH (ref 70–99)
Glucose-Capillary: 132 mg/dL — ABNORMAL HIGH (ref 70–99)

## 2024-01-13 LAB — HEPARIN LEVEL (UNFRACTIONATED): Heparin Unfractionated: 0.42 [IU]/mL (ref 0.30–0.70)

## 2024-01-13 LAB — CBC
HCT: 33.3 % — ABNORMAL LOW (ref 36.0–46.0)
Hemoglobin: 11.1 g/dL — ABNORMAL LOW (ref 12.0–15.0)
MCH: 28.9 pg (ref 26.0–34.0)
MCHC: 33.3 g/dL (ref 30.0–36.0)
MCV: 86.7 fL (ref 80.0–100.0)
Platelets: 407 K/uL — ABNORMAL HIGH (ref 150–400)
RBC: 3.84 MIL/uL — ABNORMAL LOW (ref 3.87–5.11)
RDW: 13.8 % (ref 11.5–15.5)
WBC: 17.9 K/uL — ABNORMAL HIGH (ref 4.0–10.5)
nRBC: 0.1 % (ref 0.0–0.2)

## 2024-01-13 LAB — APTT: aPTT: 82 s — ABNORMAL HIGH (ref 24–36)

## 2024-01-13 LAB — MAGNESIUM: Magnesium: 1.6 mg/dL — ABNORMAL LOW (ref 1.7–2.4)

## 2024-01-13 LAB — PHOSPHORUS: Phosphorus: 3.9 mg/dL (ref 2.5–4.6)

## 2024-01-13 LAB — LIPASE, BLOOD: Lipase: 191 U/L — ABNORMAL HIGH (ref 11–51)

## 2024-01-13 MED ORDER — POTASSIUM CHLORIDE 20 MEQ PO PACK: 40.0000 meq | PACK | ORAL | Status: DC

## 2024-01-13 MED ORDER — POLYETHYLENE GLYCOL 3350 17 G PO PACK
17.0000 g | PACK | Freq: Every day | ORAL | Status: AC
  Administered 2024-01-14: 09:00:00 17 g via ORAL
  Filled 2024-01-13: qty 1

## 2024-01-13 MED ORDER — INSULIN ASPART 100 UNIT/ML IJ SOLN
5.0000 [IU] | Freq: Three times a day (TID) | INTRAMUSCULAR | Status: DC
  Filled 2024-01-13: qty 5

## 2024-01-13 MED ORDER — SODIUM CHLORIDE 0.9 % IV SOLN
1.0000 g | Freq: Three times a day (TID) | INTRAVENOUS | Status: DC
  Administered 2024-01-13 – 2024-01-15 (×7): 1 g via INTRAVENOUS
  Filled 2024-01-13 (×7): qty 5

## 2024-01-13 MED ORDER — POTASSIUM CHLORIDE CRYS ER 20 MEQ PO TBCR
40.0000 meq | EXTENDED_RELEASE_TABLET | Freq: Two times a day (BID) | ORAL | Status: AC
  Administered 2024-01-13 (×2): 40 meq via ORAL
  Filled 2024-01-13 (×2): qty 2

## 2024-01-13 MED ORDER — LISINOPRIL 2.5 MG PO TABS
5.0000 mg | ORAL_TABLET | Freq: Every day | ORAL | Status: DC
  Administered 2024-01-13: 5 mg via ORAL
  Filled 2024-01-13: qty 2

## 2024-01-13 MED ORDER — MORPHINE SULFATE (PF) 4 MG/ML IV SOLN
4.0000 mg | INTRAVENOUS | Status: DC | PRN
  Administered 2024-01-13 (×3): 4 mg via INTRAVENOUS
  Filled 2024-01-13 (×3): qty 1

## 2024-01-13 MED ORDER — SODIUM CHLORIDE 0.9 % IV SOLN: INTRAVENOUS | Status: DC

## 2024-01-13 MED ORDER — INSULIN ASPART 100 UNIT/ML IJ SOLN
0.0000 [IU] | INTRAMUSCULAR | Status: DC
  Administered 2024-01-13: 5 [IU] via SUBCUTANEOUS
  Administered 2024-01-13: 2 [IU] via SUBCUTANEOUS
  Administered 2024-01-13: 5 [IU] via SUBCUTANEOUS
  Administered 2024-01-14 (×2): 2 [IU] via SUBCUTANEOUS
  Administered 2024-01-14: 12:00:00 5 [IU] via SUBCUTANEOUS
  Administered 2024-01-15 (×2): 2 [IU] via SUBCUTANEOUS
  Administered 2024-01-16: 20:00:00 8 [IU] via SUBCUTANEOUS
  Administered 2024-01-16: 12:00:00 3 [IU] via SUBCUTANEOUS
  Administered 2024-01-16: 16:00:00 2 [IU] via SUBCUTANEOUS
  Administered 2024-01-17 (×2): 3 [IU] via SUBCUTANEOUS
  Administered 2024-01-17: 12:00:00 5 [IU] via SUBCUTANEOUS
  Administered 2024-01-18: 1 [IU] via SUBCUTANEOUS
  Administered 2024-01-18: 2 [IU] via SUBCUTANEOUS
  Administered 2024-01-18: 3 [IU] via SUBCUTANEOUS
  Administered 2024-01-18: 2 [IU] via SUBCUTANEOUS
  Filled 2024-01-13: qty 3
  Filled 2024-01-13: qty 2
  Filled 2024-01-13: qty 5
  Filled 2024-01-13: qty 2
  Filled 2024-01-13: qty 5
  Filled 2024-01-13: qty 3
  Filled 2024-01-13 (×2): qty 2
  Filled 2024-01-13: qty 3
  Filled 2024-01-13: qty 1
  Filled 2024-01-13: qty 8
  Filled 2024-01-13 (×2): qty 2
  Filled 2024-01-13: qty 5
  Filled 2024-01-13: qty 3
  Filled 2024-01-13: qty 5
  Filled 2024-01-13: qty 3
  Filled 2024-01-13 (×2): qty 2

## 2024-01-13 MED ORDER — INSULIN GLARGINE-YFGN 100 UNIT/ML ~~LOC~~ SOLN
7.0000 [IU] | Freq: Two times a day (BID) | SUBCUTANEOUS | Status: DC
  Administered 2024-01-13: 7 [IU] via SUBCUTANEOUS
  Filled 2024-01-13 (×2): qty 0.07

## 2024-01-13 MED ORDER — METOPROLOL TARTRATE 25 MG PO TABS
25.0000 mg | ORAL_TABLET | Freq: Two times a day (BID) | ORAL | Status: DC
  Administered 2024-01-13 – 2024-01-14 (×4): 25 mg via ORAL
  Filled 2024-01-13 (×4): qty 1

## 2024-01-13 MED ORDER — CLONAZEPAM 0.5 MG PO TABS: 0.5000 mg | ORAL_TABLET | Freq: Once | ORAL | Status: DC | PRN

## 2024-01-13 MED ORDER — INSULIN GLARGINE-YFGN 100 UNIT/ML ~~LOC~~ SOLN
7.0000 [IU] | Freq: Every day | SUBCUTANEOUS | Status: DC
  Administered 2024-01-14 – 2024-01-18 (×4): 7 [IU] via SUBCUTANEOUS
  Filled 2024-01-13 (×5): qty 0.07

## 2024-01-13 MED ORDER — MAGNESIUM OXIDE -MG SUPPLEMENT 400 (240 MG) MG PO TABS
400.0000 mg | ORAL_TABLET | ORAL | Status: AC
  Administered 2024-01-13 (×2): 400 mg via ORAL
  Filled 2024-01-13 (×2): qty 1

## 2024-01-13 MED ORDER — INSULIN GLARGINE-YFGN 100 UNIT/ML ~~LOC~~ SOLN
10.0000 [IU] | Freq: Two times a day (BID) | SUBCUTANEOUS | Status: DC
  Filled 2024-01-13: qty 0.1

## 2024-01-13 NOTE — Progress Notes (Signed)
 PHARMACY - ANTICOAGULATION CONSULT NOTE  Pharmacy Consult for heparin  Indication: pulmonary embolus  Allergies[1]  Patient Measurements: Height: 5' 1 (154.9 cm) Weight: 75.3 kg (166 lb) IBW/kg (Calculated) : 47.8 HEPARIN  DW (KG): 64.4  Vital Signs: Temp: 98.4 F (36.9 C) (12/14 0355) Temp Source: Oral (12/14 0355) BP: 162/111 (12/14 0800) Pulse Rate: 111 (12/14 0800)  Labs: Recent Labs    01/11/24 1054 01/11/24 1221 01/11/24 1359 01/11/24 2109 01/11/24 2232 01/12/24 0554 01/12/24 1302 01/12/24 2320 01/13/24 0618  HGB 12.8 12.9  --   --   --  10.8*  --   --  11.1*  HCT 37.3 38.0  --   --   --  32.0*  --   --  33.3*  PLT 325  325  --   --   --   --  331  --   --  407*  APTT 43*  --   --   --    < > 75* 56* 86* 82*  LABPROT 24.6*  --   --   --   --   --   --   --   --   INR 2.1*  --   --   --   --   --   --   --   --   HEPARINUNFRC  --   --   --   --    < > 0.63 0.49  --  0.42  CREATININE 1.28*  --    < > 0.89  --  0.82  --   --  0.67   < > = values in this interval not displayed.    Estimated Creatinine Clearance: 81.6 mL/min (by C-G formula based on SCr of 0.67 mg/dL).   Medical History: Past Medical History:  Diagnosis Date   Abnormal glucose    Adjustment disorder    Asplenia    Cerebral aneurysm    Cerebral degeneration associated with generalized lipidosis    Depression    Diabetes mellitus without complication (HCC)    Hypertension    OCD (obsessive compulsive disorder)    Peripheral neuropathy     Medications:  Infusions:   aztreonam  Stopped (01/13/24 0650)   famotidine  (PEPCID ) IV Stopped (01/12/24 2236)   heparin  1,350 Units/hr (01/13/24 0659)   levofloxacin  (LEVAQUIN ) IV Stopped (01/12/24 1304)   promethazine  (PHENERGAN ) injection (IM or IVPB) Stopped (01/12/24 1126)    Assessment: 48 yof presented to the ED with CP and vomiting. Found to have bilateral PE's and possible DVT. To start IV heparin . Pt is on chronic low dose xarelto  10mg .  Baseline CBC is ok. Will proceed with heparin  bolus since PTA xarelto  is not a therapeutic dose. Will monitor with aPTT's initially since the xarelto  may impact the heparin  level.   Today, 01/13/2024 06:18 heparin  level = 0.42 and aPTT 82, both therapeutic with heparin  infusing at 1350 units/hr Heparin  level correlating with aPTT CBC: hgb up to 11.1 and PLT WNL No bleeding or complications of therapy per RN  Goal of Therapy:  Heparin  level 0.3-0.7 units/ml aPTT 66-103 seconds Monitor platelets by anticoagulation protocol: Yes   Plan:  Continue IV heparin  at 1350 units/hr Discontinue aPTTs for titration Monitor daily heparin  level, CBC, signs/symptoms of bleeding   Thank you for allowing pharmacy to be a part of this patients care.  Eleanor EMERSON Agent, PharmD, BCPS Clinical Pharmacist [place] 01/13/2024 8:14 AM             [1]  Allergies Allergen Reactions   Ceftin [Cefuroxime Axetil] Shortness Of Breath and Swelling   Cinobac [Cinoxacin] Shortness Of Breath and Swelling    Has received Levaquin  in inpt & outpt setting   Fire Ant Anaphylaxis   Sulfa Antibiotics Other (See Comments)    Unknown

## 2024-01-13 NOTE — Progress Notes (Signed)
 [facility] PROGRESS NOTE    Progress Note  Megan Weber  [name] DOB: 09-22-1975 DOA: 01/11/2024 PCP: Pia Kerney SQUIBB, MD     Brief Narrative:   Megan Weber is an 48 y.o. 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  In the ED was found to be septic with pneumonia and an acute PE.  Assessment/Plan:   Sepsis (HCC) due to multifocal pneumonia: Concern about aspiration was started on broad-spectrum antibiotics. MRSA PCR negative, continue IV levofloxacin  and aztreonam . She has defervesced leukocytosis improving. She has been weaned to room air. Fortunately her blood pressure is stable she remains tachycardic.  New acute bilateral pulmonary embolism/she does have a history of PE: She was to be to be on Xarelto  but stopped taking it about 10 days prior to admission then she resuming prior to coming to the hospital. This could have contributed to her new VTE, due to noncompliance. On IV heparin . 2D echo is pending.  Diabetes mellitus type 2 uncontrolled with hyperglycemia/DKA: On admission blood glucose of 500 bicarbonate of 16 and gap of 20. She does not use a insulin  at home she is just on metformin. A1c of 10.0, now off IV insulin . On long-acting insulin  plus sliding scale blood glucose still elevated. Increase long-acting insulin  and meal coverage. Diabetes coordinator has been consulted. Might need insulin  at home.  Acute Idiopathic acute pancreatitis without infection or necrosis Lipase was elevated CT scan showed inflammatory changes on the pancreatic tail. Etiology remains unclear.  Triglycerides  were less than 500. On no medications to my knowledge have caused acute pancreatitis.  Noted that Abilify can cause pancreatitis and currently holding. Need to follow-up with GI as an outpatient. Her lipase is up this morning she is complaining of abdominal pain, we will place her n.p.o. recheck in the morning.  She also remains tachycardic with a significantly elevated blood pressure. Check an abdominal ultrasound alkaline phosphatase elevated.  Mild acute kidney injury: Now resolved.  Hypokalemia/hypomagnesemia:  Continues to be low replete orally recheck in the morning. Magnesium  still low replete recheck in the morning. Try to keep potassium greater than 4 magnesium  greater than 2.  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 Gets Xenopozyme infusions every month.   Systemic mastocytosis: Followed by [facility] at home on immunotherapy these were held due to multifocal pneumonia. Can be resumed as an outpatient.  Essential hypertension: Continue metoprolol  and lisinopril .  History of cerebral aneurysm: No findings.  Morbid obesity: Noted she has been counseled.  DVT prophylaxis: lovenox  Family Communication:none Status is: Inpatient Remains inpatient appropriate because: Abscess due to multifocal pneumonia    Code Status:     Code Status Orders  (From admission, onward)           Start     Ordered   01/11/24 2024  Full code  Continuous       Question:  By:  Answer:  Default: patient does not have capacity for decision making, no surrogate or prior directive available   01/11/24 2026           Code Status History     Date Active Date Inactive Code Status Order ID Comments User Context   04/10/2017 0432 04/10/2017 1610 Full  Code [id number]  Franky Redia SAILOR, MD Inpatient         IV Access:   Peripheral IV   Procedures and diagnostic studies:   CT CHEST ABDOMEN PELVIS W CONTRAST Result Date: 01/11/2024 EXAM: CT CHEST, ABDOMEN AND  PELVIS WITH CONTRAST 01/11/2024 12:23:00 PM TECHNIQUE: CT of the chest, abdomen and pelvis was performed with the administration of intravenous contrast. Multiplanar reformatted images are provided for review. Automated exposure control, iterative reconstruction, and/or weight based adjustment of the mA/kV was utilized to reduce the radiation dose to as low as reasonably achievable. COMPARISON: CTA chest 03/15/2022, CT abdomen and pelvis 01/30/2017. CLINICAL HISTORY: 48 year old female, sepsis. FINDINGS: CHEST: MEDIASTINUM AND LYMPH NODES: Low density bilateral pulmonary emboli are present in the lobar branches, at the left main pulmonary artery bifurcation. No saddle embolus. Abundant bilateral lower lobes branch involvement. Heart and pericardium are unremarkable. The central airways are clear. No mediastinal, hilar or axillary lymphadenopathy. Right chest port a cath is new, no adverse features. No pericardial effusion. Negative thoracic aorta. LUNGS AND PLEURA: Multifocal peribronchial ground glass opacity, including in the posterior right upper lobe and streaky more confluent bilateral middle lobe and left lower lobe opacity which is not significantly enhancing. This area does not appear severely affected by pulmonary emboli. No pleural effusion. No large pulmonary infarct. No thoracic lymphadenopathy. Major airways are patent. ABDOMEN AND PELVIS: LIVER: Evidence of chronic hepatic steatosis. GALLBLADDER AND BILE DUCTS: Gallbladder is unremarkable. No biliary ductal dilatation. SPLEEN: Chronic splenectomy. PANCREAS: Chronic splenectomy with mild architectural distortion at the tail of the pancreas. However, peripancreatic inflammatory stranding on coronal image 51 is new compared to 2019 , and also was not apparent on the CTA Chest last year. Pancreatic enhancement is maintained. No ductal dilatation. ADRENAL GLANDS: No acute abnormality. KIDNEYS, URETERS AND BLADDER: No stones in the kidneys or ureters. No  hydronephrosis. No perinephric or periureteral stranding. Urinary bladder is unremarkable. GI AND BOWEL: Fluid throughout nondilated large and small bowel loops including fluid in the colon present to the rectum. Normal retrocecal appendix containing gas. No discrete bowel wall thickening, although generalized mucosal hyperenhancement is possible. Nondilated stomach and duodenum. REPRODUCTIVE ORGANS: No acute abnormality. PERITONEUM AND RETROPERITONEUM: No ascites. No free air. Subtle but asymmetric low density within the left common femoral vein. VASCULATURE: Aorta is normal in caliber. Abdominal aortic calcified atherosclerosis. Major arterial structures and portal venous system appear to be patent. ABDOMINAL AND PELVIS LYMPH NODES: No lymphadenopathy. BONES AND SOFT TISSUES: Chronic disc degeneration at the lumbosacral junction with vacuum disc. No acute osseous abnormality. No focal soft tissue abnormality. IMPRESSION: 1. Acute bilateral pulmonary emboli including some lobar artery involvement (RV/LV ratio 0.71, no obvious heart strain). Possible left common femoral vein deep venous thrombosis; Ultrasound would be necessary to confirm. 2. Superimposed multifocal peribronchial and streaky lung opacity, more consistent with bronchopneumonia/infection than pulmonary infarction. No pleural effusion. 3. Evidence of Acute Pancreatitis limited to the pancreatic tail. 4. Fluid and possible mucosal hyperenhancement throughout nondilated small and large bowel loops, suggesting acute enteritis / diarrhea. Normal appendix. 5. Chronically absent spleen. Chronic hepatic steatosis, calcified aortic atherosclerosis. 6. Salient findings discussed discussed by telephone with Dr. Yolande in the ED at 1244 hours. Electronically signed by: Helayne Hurst MD 01/11/2024 12:47 PM EST RP Workstation: [id number]     Medical Consultants:   None.   Subjective:    Megan Weber she relates she has abdominal pain which is made  worse with eating.  Objective:  Vitals:   01/13/24 0500 01/13/24 0600 01/13/24 0700 01/13/24 0800  BP: (!) 165/88 (!) 186/94 (!) 160/82 (!) 162/111  Pulse: (!) 110 (!) 114 (!) 111 (!) 111  Resp: 19 (!) 22 20 (!) 27  Temp:      TempSrc:      SpO2: 96% 99% 93% 96%  Weight:      Height:       SpO2: 96 % O2 Flow Rate (L/min): 3 L/min   Intake/Output Summary (Last 24 hours) at 01/13/2024 0825 Last data filed at 01/13/2024 0659 Gross per 24 hour  Intake 1478.61 ml  Output 1000 ml  Net 478.61 ml   Filed Weights   01/11/24 1008  Weight: 75.3 kg    Exam: General exam: In no acute distress. Respiratory system: Good air movement and clear to auscultation. Cardiovascular system: S1 & S2 heard, RRR. No JVD. Gastrointestinal system: Abdomen is nondistended, soft and nontender.  Extremities: No pedal edema. Skin: No rashes, lesions or ulcers Psychiatry: Judgement and insight appear normal. Mood & affect appropriate.    Data Reviewed:    Labs: Basic Metabolic Panel: Recent Labs  Lab 01/11/24 1054 01/11/24 1221 01/11/24 1359 01/11/24 2109 01/12/24 0554 01/13/24 0618  NA 128* 133* 136 135 139 138  K 3.3* 3.4* 3.4* 3.4* 3.4* 3.2*  CL 92*  --  103 103 107 103  CO2 16*  --  17* 16* 21* 24  GLUCOSE 505*  --  293* 292* 203* 252*  BUN 16  --  13 9 5* 7  CREATININE 1.28*  --  0.90 0.89 0.82 0.67  CALCIUM  9.1  --  7.9* 8.8* 8.6* 8.5*  MG  --   --   --  1.7  --  1.6*  PHOS  --   --   --  1.9*  --  3.9   GFR Estimated Creatinine Clearance: 81.6 mL/min (by C-G formula based on SCr of 0.67 mg/dL). Liver Function Tests: Recent Labs  Lab 01/11/24 1054 01/11/24 2109 01/12/24 0554 01/13/24 0618  AST 36 34 29 17  ALT 18 16 13 13   ALKPHOS 157* 155* 140* 147*  BILITOT 0.7 0.8 0.7 0.5  PROT 7.2 6.5 5.7* 5.8*  ALBUMIN  3.7 3.3* 2.9* 2.9*   Recent Labs  Lab 01/11/24 1054 01/13/24 0618  LIPASE 151* 191*   No results for input(s): AMMONIA in the last 168  hours. Coagulation profile Recent Labs  Lab 01/11/24 1054  INR 2.1*   COVID-19 Labs  Recent Labs    01/11/24 1054 01/11/24 2232 01/12/24 0032  DDIMER 2.38*  --   --   FERRITIN  --  339*  --   CRP  --   --  26.4*    Lab Results  Component Value Date   SARSCOV2NAA NEGATIVE 01/11/2024    CBC: Recent Labs  Lab 01/11/24 1054 01/11/24 1221 01/12/24 0554 01/13/24 0618  WBC 21.0*  --  21.0* 17.9*  NEUTROABS 16.7*  --   --   --   HGB 12.8 12.9 10.8* 11.1*  HCT 37.3 38.0 32.0* 33.3*  MCV 85.9  --  87.0 86.7  PLT 325  325  --  331 407*   Cardiac Enzymes: No results for input(s): CKTOTAL, CKMB, CKMBINDEX, TROPONINI in the last 168 hours. BNP (last 3 results) No results for input(s): PROBNP in the last 8760 hours. CBG: Recent Labs  Lab 01/12/24 1110 01/12/24 1219 01/12/24 1618 01/12/24 2129 01/13/24 0817  GLUCAP 167* 165* 256* 183* 226*  D-Dimer: Recent Labs    01/11/24 1054  DDIMER 2.38*   Hgb A1c: Recent Labs    01/11/24 2109  HGBA1C 10.6*   Lipid Profile: Recent Labs    01/12/24 1302  CHOL 140  HDL 30*  LDLCALC 78  TRIG 841*  CHOLHDL 4.6   Thyroid function studies: No results for input(s): TSH, T4TOTAL, T3FREE, THYROIDAB in the last 72 hours.  Invalid input(s): FREET3 Anemia work up: Recent Labs    01/11/24 2232  FERRITIN 339*   Sepsis Labs: Recent Labs  Lab 01/11/24 1054 01/11/24 1342 01/11/24 2109 01/11/24 2232 01/12/24 0332 01/12/24 0554 01/13/24 0618  PROCALCITON  --   --  0.36  --   --   --   --   WBC 21.0*  --   --   --   --  21.0* 17.9*  LATICACIDVEN 5.1*   < > 3.0* 3.6* 2.2* 3.0*  --    < > = values in this interval not displayed.   Microbiology Recent Results (from the past 240 hours)  Resp panel by RT-PCR (RSV, Flu A&B, Covid) Anterior Nasal Swab     Status: None   Collection Time: 01/11/24 10:14 AM   Specimen: Anterior Nasal Swab  Result Value Ref Range Status   SARS Coronavirus 2 by RT PCR  NEGATIVE NEGATIVE Final    Comment: (NOTE) SARS-CoV-2 target nucleic acids are NOT DETECTED.  The SARS-CoV-2 RNA is generally detectable in upper respiratory specimens during the acute phase of infection. The lowest concentration of SARS-CoV-2 viral copies this assay can detect is 138 copies/mL. A negative result does not preclude SARS-Cov-2 infection and should not be used as the sole basis for treatment or other patient management decisions. A negative result may occur with  improper specimen collection/handling, submission of specimen other than nasopharyngeal swab, presence of viral mutation(s) within the areas targeted by this assay, and inadequate number of viral copies(<138 copies/mL). A negative result must be combined with clinical observations, patient history, and epidemiological information. The expected result is Negative.  Fact Sheet for Patients:  bloggercourse.com  Fact Sheet for Healthcare Providers:  seriousbroker.it  This test is no t yet approved or cleared by the [place]  FDA and  has been authorized for detection and/or diagnosis of SARS-CoV-2 by FDA under an Emergency Use Authorization (EUA). This EUA will remain  in effect (meaning this test can be used) for the duration of the COVID-19 declaration under Section 564(b)(1) of the Act, 21 U.S.C.section 360bbb-3(b)(1), unless the authorization is terminated  or revoked sooner.       Influenza A by PCR NEGATIVE NEGATIVE Final   Influenza B by PCR NEGATIVE NEGATIVE Final    Comment: (NOTE) The Xpert Xpress SARS-CoV-2/FLU/RSV plus assay is intended as an aid in the diagnosis of influenza from Nasopharyngeal swab specimens and should not be used as a sole basis for treatment. Nasal washings and aspirates are unacceptable for Xpert Xpress SARS-CoV-2/FLU/RSV testing.  Fact Sheet for Patients: bloggercourse.com  Fact Sheet for  Healthcare Providers: seriousbroker.it  This test is not yet approved or cleared by the [place]  FDA and has been authorized for detection and/or diagnosis of SARS-CoV-2 by FDA under an Emergency Use Authorization (EUA). This EUA will remain in effect (meaning this test can be used) for the duration of the COVID-19 declaration under Section 564(b)(1) of the Act, 21 U.S.C. section 360bbb-3(b)(1), unless the authorization is terminated or revoked.     Resp Syncytial Virus by PCR NEGATIVE NEGATIVE  Final    Comment: (NOTE) Fact Sheet for Patients: bloggercourse.com  Fact Sheet for Healthcare Providers: seriousbroker.it  This test is not yet approved or cleared by the [place]  FDA and has been authorized for detection and/or diagnosis of SARS-CoV-2 by FDA under an Emergency Use Authorization (EUA). This EUA will remain in effect (meaning this test can be used) for the duration of the COVID-19 declaration under Section 564(b)(1) of the Act, 21 U.S.C. section 360bbb-3(b)(1), unless the authorization is terminated or revoked.  Performed at [facility], [address] [place], [place] [address]   Culture, blood (routine x 2)     Status: None (Preliminary result)   Collection Time: 01/11/24 10:32 AM   Specimen: BLOOD  Result Value Ref Range Status   Specimen Description   Final    BLOOD LEFT ANTECUBITAL Performed at [facility], [address] [place], [place] [address]    Special Requests   Final    BOTTLES DRAWN AEROBIC AND ANAEROBIC Blood Culture adequate volume Performed at [facility], [address] [place], [place] [address]    Culture   Final    NO GROWTH < 24 HOURS Performed at [facility], [address] [place], [place] [address]    Report Status PENDING  Incomplete  MRSA Next Gen by PCR, Nasal     Status: None   Collection Time: 01/11/24   7:01 PM   Specimen: Nasal Mucosa; Nasal Swab  Result Value Ref Range Status   MRSA by PCR Next Gen NOT DETECTED NOT DETECTED Final    Comment: (NOTE) The GeneXpert MRSA Assay (FDA approved for NASAL specimens only), is one component of a comprehensive MRSA colonization surveillance program. It is not intended to diagnose MRSA infection nor to guide or monitor treatment for MRSA infections. Test performance is not FDA approved in patients less than 3 years old. Performed at [facility], [address], [place], [place] [address]   Culture, blood (routine x 2) Call MD if unable to obtain prior to antibiotics being given     Status: None (Preliminary result)   Collection Time: 01/11/24  9:09 PM   Specimen: BLOOD LEFT HAND  Result Value Ref Range Status   Specimen Description   Final    BLOOD LEFT HAND Performed at [facility], [address], [place], [place] [address]    Special Requests   Final    BOTTLES DRAWN AEROBIC ONLY Blood Culture results may not be optimal due to an inadequate volume of blood received in culture bottles Performed at [facility], [address], [place], [place] [address]    Culture   Final    NO GROWTH < 12 HOURS Performed at [facility], [address], [place], [place] [address]    Report Status PENDING  Incomplete  Culture, blood (routine x 2) Call MD if unable to obtain prior to antibiotics being given     Status: None (Preliminary result)   Collection Time: 01/11/24  9:09 PM   Specimen: BLOOD LEFT HAND  Result Value Ref Range Status   Specimen Description   Final    BLOOD LEFT HAND Performed at [facility], [address] [place], [place] [address]    Special Requests   Final    BOTTLES DRAWN AEROBIC ONLY Blood Culture adequate volume Performed at [facility], [address] [place],  [place] [address]    Culture   Final    NO GROWTH < 12 HOURS Performed at  [facility], [address], [place], [place] [address]    Report Status PENDING  Incomplete     Medications:    Chlorhexidine  Gluconate Cloth  6 each Topical Daily   cyanocobalamin   1,000 mcg Oral Daily   DULoxetine   120 mg Oral Daily   folic acid   1 mg Oral Daily   gabapentin   400 mg Oral TID   hydroxychloroquine   300 mg Oral Daily   insulin  aspart  0-15 Units Subcutaneous TID WC   insulin  aspart  0-5 Units Subcutaneous QHS   insulin  glargine-yfgn  10 Units Subcutaneous Daily   ipratropium  0.5 mg Nebulization Q6H   lisinopril   2.5 mg Oral QHS   loratadine   10 mg Oral Daily   metoprolol  tartrate  12.5 mg Oral BID   montelukast   10 mg Oral QHS   pantoprazole   40 mg Oral Daily   Continuous Infusions:  aztreonam  Stopped (01/13/24 0650)   famotidine  (PEPCID ) IV Stopped (01/12/24 2236)   heparin  1,350 Units/hr (01/13/24 0659)   levofloxacin  (LEVAQUIN ) IV Stopped (01/12/24 1304)   promethazine  (PHENERGAN ) injection (IM or IVPB) Stopped (01/12/24 1126)      LOS: 2 days   Erle Odell Castor  [facility]  01/13/2024, 8:25 AM

## 2024-01-13 NOTE — Plan of Care (Signed)
  Problem: Education: Goal: Knowledge of General Education information will improve Description: Including pain rating scale, medication(s)/side effects and non-pharmacologic comfort measures Outcome: Progressing   Problem: Clinical Measurements: Goal: Ability to maintain clinical measurements within normal limits will improve Outcome: Progressing Goal: Diagnostic test results will improve Outcome: Progressing Goal: Respiratory complications will improve Outcome: Progressing Goal: Cardiovascular complication will be avoided Outcome: Progressing   Problem: Activity: Goal: Risk for activity intolerance will decrease Outcome: Progressing   Problem: Nutrition: Goal: Adequate nutrition will be maintained Outcome: Progressing   Problem: Coping: Goal: Level of anxiety will decrease Outcome: Progressing

## 2024-01-14 LAB — HEPARIN LEVEL (UNFRACTIONATED)
Heparin Unfractionated: 0.25 [IU]/mL — ABNORMAL LOW (ref 0.30–0.70)
Heparin Unfractionated: 0.38 [IU]/mL (ref 0.30–0.70)
Heparin Unfractionated: 0.36 [IU]/mL (ref 0.30–0.70)

## 2024-01-14 LAB — BASIC METABOLIC PANEL WITH GFR
Anion gap: 8 (ref 5–15)
BUN: 7 mg/dL (ref 6–20)
CO2: 21 mmol/L — ABNORMAL LOW (ref 22–32)
Calcium: 7.7 mg/dL — ABNORMAL LOW (ref 8.9–10.3)
Chloride: 112 mmol/L — ABNORMAL HIGH (ref 98–111)
Creatinine, Ser: 0.73 mg/dL (ref 0.44–1.00)
GFR, Estimated: >60 mL/min (ref >60)
Glucose, Bld: 103 mg/dL — ABNORMAL HIGH (ref 70–99)
Potassium: 3.9 mmol/L (ref 3.5–5.1)
Sodium: 140 mmol/L (ref 135–145)

## 2024-01-14 LAB — CBC
HCT: 27.1 % — ABNORMAL LOW (ref 36.0–46.0)
Hemoglobin: 8.9 g/dL — ABNORMAL LOW (ref 12.0–15.0)
MCH: 29.8 pg (ref 26.0–34.0)
MCHC: 32.8 g/dL (ref 30.0–36.0)
MCV: 90.6 fL (ref 80.0–100.0)
Platelets: 363 K/uL (ref 150–400)
RBC: 2.99 MIL/uL — ABNORMAL LOW (ref 3.87–5.11)
RDW: 14.6 % (ref 11.5–15.5)
WBC: 14.1 K/uL — ABNORMAL HIGH (ref 4.0–10.5)
nRBC: 0.1 % (ref 0.0–0.2)

## 2024-01-14 LAB — GLUCOSE, CAPILLARY
Glucose-Capillary: 101 mg/dL — ABNORMAL HIGH (ref 70–99)
Glucose-Capillary: 103 mg/dL — ABNORMAL HIGH (ref 70–99)
Glucose-Capillary: 114 mg/dL — ABNORMAL HIGH (ref 70–99)
Glucose-Capillary: 115 mg/dL — ABNORMAL HIGH (ref 70–99)
Glucose-Capillary: 132 mg/dL — ABNORMAL HIGH (ref 70–99)
Glucose-Capillary: 139 mg/dL — ABNORMAL HIGH (ref 70–99)
Glucose-Capillary: 202 mg/dL — ABNORMAL HIGH (ref 70–99)

## 2024-01-14 LAB — LEGIONELLA PNEUMOPHILA SEROGP 1 UR AG: L. pneumophila Serogp 1 Ur Ag: NEGATIVE

## 2024-01-14 LAB — LIPASE, BLOOD: Lipase: 132 U/L — ABNORMAL HIGH (ref 11–51)

## 2024-01-14 LAB — MAGNESIUM: Magnesium: 1.6 mg/dL — ABNORMAL LOW (ref 1.7–2.4)

## 2024-01-14 MED ORDER — SODIUM CHLORIDE 0.45 % IV SOLN: INTRAVENOUS | Status: DC

## 2024-01-14 MED ORDER — ALBUTEROL SULFATE (2.5 MG/3ML) 0.083% IN NEBU
2.5000 mg | INHALATION_SOLUTION | RESPIRATORY_TRACT | Status: DC | PRN
  Administered 2024-01-15 – 2024-01-17 (×3): 2.5 mg via RESPIRATORY_TRACT
  Filled 2024-01-14 (×3): qty 3

## 2024-01-14 MED ORDER — SODIUM CHLORIDE 0.9 % IV SOLN: INTRAVENOUS | Status: AC

## 2024-01-14 MED ORDER — SODIUM CHLORIDE 0.9 % IV BOLUS
500.0000 mL | Freq: Once | INTRAVENOUS | Status: AC
  Administered 2024-01-14: 01:00:00 500 mL via INTRAVENOUS

## 2024-01-14 MED ORDER — SODIUM CHLORIDE 0.9 % IV SOLN: INTRAVENOUS | Status: DC

## 2024-01-14 MED ORDER — INSULIN STARTER KIT- PEN NEEDLES (ENGLISH)
1.0000 | Freq: Once | Status: AC
  Administered 2024-01-16: 16:00:00 1
  Filled 2024-01-14: qty 1

## 2024-01-14 MED ORDER — ALBUMIN HUMAN 5 % IV SOLN
12.5000 g | Freq: Once | INTRAVENOUS | Status: AC
  Administered 2024-01-14: 02:00:00 12.5 g via INTRAVENOUS
  Filled 2024-01-14: qty 250

## 2024-01-14 MED ORDER — LIVING WELL WITH DIABETES BOOK
Freq: Once | Status: AC
  Filled 2024-01-14: qty 1

## 2024-01-14 NOTE — Progress Notes (Signed)
 PHARMACY - ANTICOAGULATION CONSULT NOTE  Pharmacy Consult for Heparin  Indication: pulmonary embolus  Allergies[1]  Patient Measurements: Height: 5' 1 (154.9 cm) Weight: 75.3 kg (166 lb) IBW/kg (Calculated) : 47.8 HEPARIN  DW (KG): 64.4  Vital Signs: Temp: 98.7 F (37.1 C) (12/15 1115) Temp Source: Oral (12/15 1115) BP: 123/73 (12/15 0905) Pulse Rate: 88 (12/15 0700)  Labs: Recent Labs    01/12/24 0554 01/12/24 1302 01/12/24 2320 01/13/24 0618 01/14/24 0509 01/14/24 1215  HGB 10.8*  --   --  11.1* 8.9*  --   HCT 32.0*  --   --  33.3* 27.1*  --   PLT 331  --   --  407* 363  --   APTT 75* 56* 86* 82*  --   --   HEPARINUNFRC 0.63 0.49  --  0.42 0.25* 0.38  CREATININE 0.82  --   --  0.67 0.73  --     Estimated Creatinine Clearance: 81.6 mL/min (by C-G formula based on SCr of 0.73 mg/dL).   Medical History: Past Medical History:  Diagnosis Date   Abnormal glucose    Adjustment disorder    Asplenia    Cerebral aneurysm    Cerebral degeneration associated with generalized lipidosis    Depression    Diabetes mellitus without complication (HCC)    Hypertension    OCD (obsessive compulsive disorder)    Peripheral neuropathy     Assessment: AC/Heme: Heparin  for new B/L PE + ?DVT (PTA xarelto  10mg ) - Baseline Hgb Hgb 12.9, plts 325, no AC PTA - Hep level 0.25 low>>0.38 now in goal, Hgb down to 8.9, Plts WNL  Goal of Therapy:  Heparin  level 0.3-0.7 units/ml Monitor platelets by anticoagulation protocol: Yes   Plan:  Con't IV heparin  1500 units/hr Confirm therapeutic heparin  level in 6 hrs. Daily heparin  level and CBC   Muaz Shorey Karoline Marina, PharmD, BCPS Clinical Staff Pharmacist Marina, Verlie Hellenbrand Stillinger 01/14/2024,1:06 PM      [1]  Allergies Allergen Reactions   Ceftin [Cefuroxime Axetil] Shortness Of Breath and Swelling   Cinobac [Cinoxacin] Shortness Of Breath and Swelling    Has received Levaquin  in inpt & outpt setting   Fire Ant  Anaphylaxis   Sulfa Antibiotics Other (See Comments)    Unknown

## 2024-01-14 NOTE — Plan of Care (Signed)
°  Problem: Clinical Measurements: Goal: Ability to maintain clinical measurements within normal limits will improve Outcome: Progressing Goal: Diagnostic test results will improve Outcome: Progressing Goal: Respiratory complications will improve Outcome: Progressing Goal: Cardiovascular complication will be avoided Outcome: Progressing   Problem: Activity: Goal: Risk for activity intolerance will decrease Outcome: Progressing   Problem: Elimination: Goal: Will not experience complications related to urinary retention Outcome: Progressing

## 2024-01-14 NOTE — Progress Notes (Signed)
 PHARMACY - ANTICOAGULATION CONSULT NOTE  Pharmacy Consult for heparin  Indication: pulmonary embolus  Allergies[1]  Patient Measurements: Height: 5' 1 (154.9 cm) Weight: 75.3 kg (166 lb) IBW/kg (Calculated) : 47.8 HEPARIN  DW (KG): 64.4  Vital Signs: Temp: 99 F (37.2 C) (12/15 0357) Temp Source: Axillary (12/15 0357) BP: 88/46 (12/15 0142) Pulse Rate: 101 (12/15 0142)  Labs: Recent Labs    01/11/24 1054 01/11/24 1221 01/11/24 2109 01/11/24 2232 01/12/24 0554 01/12/24 1302 01/12/24 2320 01/13/24 0618 01/14/24 0509  HGB 12.8   < >  --   --  10.8*  --   --  11.1* 8.9*  HCT 37.3   < >  --   --  32.0*  --   --  33.3* 27.1*  PLT 325  325  --   --   --  331  --   --  407* 363  APTT 43*  --   --    < > 75* 56* 86* 82*  --   LABPROT 24.6*  --   --   --   --   --   --   --   --   INR 2.1*  --   --   --   --   --   --   --   --   HEPARINUNFRC  --   --   --    < > 0.63 0.49  --  0.42 0.25*  CREATININE 1.28*   < > 0.89  --  0.82  --   --  0.67  --    < > = values in this interval not displayed.    Estimated Creatinine Clearance: 81.6 mL/min (by C-G formula based on SCr of 0.67 mg/dL).   Medical History: Past Medical History:  Diagnosis Date   Abnormal glucose    Adjustment disorder    Asplenia    Cerebral aneurysm    Cerebral degeneration associated with generalized lipidosis    Depression    Diabetes mellitus without complication (HCC)    Hypertension    OCD (obsessive compulsive disorder)    Peripheral neuropathy     Medications:  Infusions:   sodium chloride  100 mL/hr at 01/14/24 0117   aztreonam  Stopped (01/13/24 2259)   famotidine  (PEPCID ) IV Stopped (01/13/24 2221)   heparin  1,350 Units/hr (01/14/24 0254)   levofloxacin  (LEVAQUIN ) IV Stopped (01/13/24 1131)   promethazine  (PHENERGAN ) injection (IM or IVPB) Stopped (01/13/24 1231)    Assessment: 48 yof presented to the ED with CP and vomiting. Found to have bilateral PE's and possible DVT. To start IV  heparin . Pt is on chronic low dose xarelto  10mg . Baseline CBC is ok. Will proceed with heparin  bolus since PTA xarelto  is not a therapeutic dose. Will monitor with aPTT's initially since the xarelto  may impact the heparin  level.   Today, 01/14/2024 Heparin  level 0.25 subtherapeutic on 1350 units/hr Hgb down to 8.9, plts 363 No bleeding noted   Goal of Therapy:  Heparin  level 0.3-0.7 units/ml aPTT 66-103 seconds Monitor platelets by anticoagulation protocol: Yes   Plan:  Increase heparin  drip to 1500 units/hr Heparin  level in 6 hours Monitor daily heparin  level, CBC, signs/symptoms of bleeding   Thank you for allowing pharmacy to be a part of this patients care.  Leeroy Mace RPh 01/14/2024, 5:41 AM             [1]  Allergies Allergen Reactions   Ceftin [Cefuroxime Axetil] Shortness Of Breath and Swelling   Cinobac [Cinoxacin] Shortness  Of Breath and Swelling    Has received Levaquin  in inpt & outpt setting   Fire Ant Anaphylaxis   Sulfa Antibiotics Other (See Comments)    Unknown

## 2024-01-14 NOTE — Progress Notes (Signed)
 PHARMACY - ANTICOAGULATION CONSULT NOTE  Pharmacy Consult for Heparin  Indication: pulmonary embolus  Allergies[1]  Patient Measurements: Height: 5' 1 (154.9 cm) Weight: 75.3 kg (166 lb) IBW/kg (Calculated) : 47.8 HEPARIN  DW (KG): 64.4  Vital Signs: Temp: 98.7 F (37.1 C) (12/15 1115) Temp Source: Oral (12/15 1115) BP: 123/73 (12/15 0905) Pulse Rate: 88 (12/15 0700)  Labs: Recent Labs    01/12/24 0554 01/12/24 1302 01/12/24 2320 01/13/24 0618 01/14/24 0509 01/14/24 1215  HGB 10.8*  --   --  11.1* 8.9*  --   HCT 32.0*  --   --  33.3* 27.1*  --   PLT 331  --   --  407* 363  --   APTT 75* 56* 86* 82*  --   --   HEPARINUNFRC 0.63 0.49  --  0.42 0.25* 0.38  CREATININE 0.82  --   --  0.67 0.73  --     Estimated Creatinine Clearance: 81.6 mL/min (by C-G formula based on SCr of 0.73 mg/dL).  Medications: Infusions:   sodium chloride  40 mL/hr at 01/14/24 1136   aztreonam  1 g (01/14/24 1332)   famotidine  (PEPCID ) IV Stopped (01/14/24 0957)   heparin  1,500 Units/hr (01/14/24 1136)   levofloxacin  (LEVAQUIN ) IV 100 mL/hr at 01/14/24 1136   promethazine  (PHENERGAN ) injection (IM or IVPB) Stopped (01/13/24 1231)     Assessment: 26 yoF presented to ED on 12/12 with CP and vomiting. Found to have bilateral PE's and possible DVT.  Pt is on chronic low dose Xarelto  10mg , but patient reports noncompliance.  Pharmacy is consulted to dose Heparin  IV.  Confirmatory Heparin  level 0.36, therapeutic on heparin  1500 units/hr.  No bleeding or complications reported by RN  Goal of Therapy:  Heparin  level 0.3-0.7 units/ml Monitor platelets by anticoagulation protocol: Yes   Plan:  Con't IV heparin  1500 units/hr Daily heparin  level and CBC Follow up plan for transition to Xarelto  on 12/16.   Wanda Hasting PharmD, BCPS [facility] main pharmacy [phone] 01/14/2024 2:27 PM        [1]  Allergies Allergen Reactions   Ceftin [Cefuroxime Axetil] Shortness Of Breath and Swelling    Cinobac [Cinoxacin] Shortness Of Breath and Swelling    Has received Levaquin  in inpt & outpt setting   Fire Ant Anaphylaxis   Sulfa Antibiotics Other (See Comments)    Unknown

## 2024-01-14 NOTE — Inpatient Diabetes Management (Addendum)
 Inpatient Diabetes Program Recommendations  AACE/ADA: New Consensus Statement on Inpatient Glycemic Control (2015)  Target Ranges:  Prepandial:   less than 140 mg/dL      Peak postprandial:   less than 180 mg/dL (1-2 hours)      Critically ill patients:  140 - 180 mg/dL   Lab Results  Component Value Date   GLUCAP 202 (H) 01/14/2024   HGBA1C 10.6 (H) 01/11/2024    Review of Glycemic Control  Latest Reference Range & Units 01/13/24 16:42 01/13/24 20:10 01/14/24 00:24 01/14/24 03:51 01/14/24 07:46 01/14/24 11:23  Glucose-Capillary 70 - 99 mg/dL 888 (H) 867 (H) 860 (H) 101 (H) 103 (H) 202 (H)  (H): Data is abnormally high  Diabetes history: DM2 Outpatient Diabetes medications: Metformin 1000 mg BID Current orders for Inpatient glycemic control: Semglee  7 units every day, 0-15 units Q4H.    Met with patient at bedside.  Reviewed patient's current A1c of 10.6%. Explained what a A1c is and what it measures. Also reviewed goal A1c with patient, importance of good glucose control @ home, and blood sugar goals.   She takes Metformin 1000 mg BID.  She did stop all of her medications about 2 weeks ago for 1 week.  She does not check her glucose.  She drinks Kool-Aid and regular sodas.  Educated on The Plate Method, CHO's, portion control, avoiding caloric beverages, CBGs at home fasting and mid afternoon, F/U with PCP every 3 months, bring meter to PCP office, long and short term complications of uncontrolled BG, and importance of exercise if allowed.    May need basal insulin  at discharge.  Ordered LWWDM booklet, Insulin  starter kit and RD consult.  Thank you, Wyvonna Pinal, MSN, CDCES Diabetes Coordinator Inpatient Diabetes Program [phone] (team pager from 8a-5p)

## 2024-01-14 NOTE — Progress Notes (Signed)
 [facility] PROGRESS NOTE    Progress Note  Megan Weber  [name] DOB: 1975-07-02 DOA: 01/11/2024 PCP: Pia Kerney SQUIBB, MD     Brief Narrative:   [name] is an 48 y.o. 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  In the ED was found to be septic with pneumonia and an acute PE.  Assessment/Plan:   Sepsis (HCC) due to multifocal pneumonia: Continue IV levofloxacin  and aztreonam  she has defervesced leukocytosis improved. Tachycardia has resolved satting well on room air.  Will complete a 5-day course  New acute bilateral pulmonary embolism/she does have a history of PE: Likely due to noncompliance with her Xarelto . 2D echo showed an EF of 60% grade 1 diastolic dysfunction Transition to oral Xarelto  tomorrow.  Diabetes mellitus type 2 uncontrolled with hyperglycemia/DKA: On admission blood glucose of 500 bicarbonate of 16 and gap of 20. She does not use a insulin  at home she is just on metformin. A1c of 10.0. Continue long-acting insulin  per sliding scale will allow clear liquid diet.  Acute Idiopathic acute pancreatitis without infection or necrosis Lipase was elevated, CT scan showed inflammatory changes on the pancreatic tail. Etiology remains unclear.  Triglycerides were less than 500. Noted that Abilify can cause pancreatitis and currently holding. Need to follow-up with GI as an outpatient. Abdominal ultrasound showed hepatic steatosis no biliary dilation No further pain lipase is decreasing, allow clear liquid diet. Discontinue narcotics.  Mild acute kidney injury: Now  resolved.  Hypokalemia/hypomagnesemia:  Try to keep potassium greater than 4 magnesium  greater than 2. Repleted now improved  Type II Niemann-Pick disease:  managed by [facility]. Gets Xenopozyme infusions every month.   Systemic mastocytosis: Followed by [facility] at home on immunotherapy these were held due to multifocal pneumonia. Can be resumed as an outpatient.  Essential hypertension: The pressure was low overnight received a bolus with albumin  continue metoprolol . Hold lisinopril .  History of cerebral aneurysm: No findings.  Morbid obesity: Noted she has been counseled.  DVT prophylaxis: lovenox  Family Communication:none Status is: Inpatient Remains inpatient appropriate because: Abscess due to multifocal pneumonia    Code Status:     Code Status Orders  (From admission, onward)           Start     Ordered   01/11/24 2024  Full code  Continuous       Question:  By:  Answer:  Default: patient does not have capacity for decision making, no surrogate or prior directive available   01/11/24 2026           Code Status History     Date Active Date Inactive Code Status Order ID Comments User Context   04/10/2017 0432 04/10/2017 1610 Full Code [id number]  Franky Redia SAILOR, MD Inpatient         IV Access:   Peripheral IV   Procedures and diagnostic studies:   ECHOCARDIOGRAM COMPLETE Result Date: 01/13/2024    ECHOCARDIOGRAM REPORT   Patient Name:   Megan Weber Date of Exam: 01/13/2024 Medical Rec #:  [id number]       Height:       61.0 in Accession #:    [id number]      Weight:  166.0 lb Date of Birth:  [birth year]       BSA:          1.745 m Patient Age:    48 years        BP:           166/99 mmHg Patient Gender: F               HR:           92 bpm. Exam Location:  Inpatient Procedure: 2D Echo, Cardiac Doppler and Color Doppler (Both Spectral and Color            Flow Doppler were utilized during procedure). Indications:    Pulmonary  Embolus  History:        Patient has no prior history of Echocardiogram examinations.                 Risk Factors:Hypertension and Diabetes.  Sonographer:    Philomena Daring Referring Phys: [id number] PAULA SOUTHERLY IMPRESSIONS  1. Left ventricular ejection fraction, by estimation, is 60 to 65%. The left ventricle has normal function. The left ventricle has no regional wall motion abnormalities. There is mild left ventricular hypertrophy. Left ventricular diastolic parameters are consistent with Grade I diastolic dysfunction (impaired relaxation).  2. Right ventricular systolic function is low normal. The right ventricular size is normal. Tricuspid regurgitation signal is inadequate for assessing PA pressure.  3. The mitral valve is grossly normal. No evidence of mitral valve regurgitation.  4. The aortic valve is tricuspid. Aortic valve regurgitation is not visualized. No aortic stenosis is present.  5. The inferior vena cava is normal in size with <50% respiratory variability, suggesting right atrial pressure of 8 mmHg. Comparison(s): No prior Echocardiogram. No right heart strain. FINDINGS  Left Ventricle: Left ventricular ejection fraction, by estimation, is 60 to 65%. The left ventricle has normal function. The left ventricle has no regional wall motion abnormalities. The left ventricular internal cavity size was normal in size. There is  mild left ventricular hypertrophy. Left ventricular diastolic parameters are consistent with Grade I diastolic dysfunction (impaired relaxation). Indeterminate filling pressures. Right Ventricle: The right ventricular size is normal. No increase in right ventricular wall thickness. Right ventricular systolic function is low normal. Tricuspid regurgitation signal is inadequate for assessing PA pressure. Left [facility]: Left atrial size was normal in size. Right [facility]: Right atrial size was normal in size. Pericardium: There is no evidence of pericardial effusion. Mitral Valve: The  mitral valve is grossly normal. No evidence of mitral valve regurgitation. Tricuspid Valve: The tricuspid valve is normal in structure. Tricuspid valve regurgitation is not demonstrated. Aortic Valve: The aortic valve is tricuspid. Aortic valve regurgitation is not visualized. No aortic stenosis is present. Pulmonic Valve: The pulmonic valve was normal in structure. Pulmonic valve regurgitation is not visualized. Aorta: The aortic root and ascending aorta are structurally normal, with no evidence of dilitation. Venous: The inferior vena cava is normal in size with less than 50% respiratory variability, suggesting right atrial pressure of 8 mmHg. IAS/Shunts: No atrial level shunt detected by color flow Doppler.  LEFT VENTRICLE PLAX 2D LVIDd:         3.30 cm   Diastology LVIDs:         2.40 cm   LV e' medial:    8.38 cm/s LV PW:         1.10 cm   LV E/e' medial:  10.1 LV IVS:  1.20 cm   LV e' lateral:   9.79 cm/s LVOT diam:     2.10 cm   LV E/e' lateral: 8.7 LV SV:         48 LV SV Index:   28 LVOT Area:     3.46 cm LV IVRT:       58 msec  RIGHT VENTRICLE             IVC RV Basal diam:  2.60 cm     IVC diam: 1.50 cm RV Mid diam:    2.30 cm RV S prime:     10.10 cm/s TAPSE (M-mode): 1.8 cm LEFT ATRIUM             Index        RIGHT ATRIUM           Index LA diam:        2.30 cm 1.32 cm/m   RA Area:     12.50 cm LA Vol (A2C):   31.4 ml 17.99 ml/m  RA Volume:   26.00 ml  14.90 ml/m LA Vol (A4C):   29.5 ml 16.91 ml/m LA Biplane Vol: 30.9 ml 17.71 ml/m  AORTIC VALVE LVOT Vmax:   100.00 cm/s LVOT Vmean:  67.300 cm/s LVOT VTI:    0.140 m  AORTA Ao Root diam: 2.90 cm Ao Asc diam:  2.70 cm MITRAL VALVE MV Area (PHT): 7.44 cm     SHUNTS MV Decel Time: 102 msec     Systemic VTI:  0.14 m MV E velocity: 84.80 cm/s   Systemic Diam: 2.10 cm MV A velocity: 100.00 cm/s MV E/A ratio:  0.85 Vinie Maxcy MD Electronically signed by Vinie Maxcy MD Signature Date/Time: 01/13/2024/3:55:04 PM    Final    US  Abdomen  Complete Result Date: 01/13/2024 CLINICAL DATA:  Abdominal pain.  Pancreatitis.  Prior splenectomy. EXAM: ABDOMEN ULTRASOUND COMPLETE COMPARISON:  CT on 01/11/2024 FINDINGS: Gallbladder: No gallstones or wall thickening visualized. No sonographic Murphy sign noted by sonographer. Common bile duct: Diameter: 3 mm, within normal limits. Liver: Diffusely increased parenchymal echogenicity, consistent with steatosis. No focal liver lesion identified. Portal vein is patent on color Doppler imaging with normal direction of blood flow towards the liver. IVC: No abnormality visualized. Pancreas: Not well visualized due to overlying bowel gas. Spleen: Surgically absent. Right Kidney: Length: 10.0 cm. Echogenicity within normal limits. No mass or hydronephrosis visualized. Left Kidney: Length: 10.6 cm. Echogenicity within normal limits. No mass or hydronephrosis visualized. Abdominal aorta: No aneurysm visualized. Other findings: None. IMPRESSION: No evidence of cholelithiasis or biliary ductal dilatation. Diffuse hepatic steatosis. Electronically Signed   By: Norleen DELENA Kil M.D.   On: 01/13/2024 10:24     Medical Consultants:   None.   Subjective:    Eleanor Lush complaints she relates she is hungry and would like to eat.  Objective:    Vitals:   01/14/24 0140 01/14/24 0142 01/14/24 0228 01/14/24 0357  BP: (!) 65/30 (!) 88/46    Pulse: (!) 101 (!) 101    Resp: 20 18    Temp:    99 F (37.2 C)  TempSrc:    Axillary  SpO2: 95% 95% 97%   Weight:      Height:       SpO2: 97 % O2 Flow Rate (L/min): 3 L/min   Intake/Output Summary (Last 24 hours) at 01/14/2024 0617 Last data filed at 01/14/2024 9391 Gross per 24 hour  Intake 3876.95 ml  Output 1050  ml  Net 2826.95 ml   Filed Weights   01/11/24 1008  Weight: 75.3 kg    Exam: General exam: In no acute distress. Respiratory system: Good air movement and clear to auscultation. Cardiovascular system: S1 & S2 heard, RRR. No  JVD. Gastrointestinal system: Abdomen is nondistended, soft and nontender.  Extremities: No pedal edema. Skin: No rashes, lesions or ulcers Psychiatry: Judgement and insight appear normal. Mood & affect appropriate.   Data Reviewed:    Labs: Basic Metabolic Panel: Recent Labs  Lab 01/11/24 1359 01/11/24 2109 01/12/24 0554 01/13/24 0618 01/14/24 0509  NA 136 135 139 138 140  K 3.4* 3.4* 3.4* 3.2* 3.9  CL 103 103 107 103 112*  CO2 17* 16* 21* 24 21*  GLUCOSE 293* 292* 203* 252* 103*  BUN 13 9 5* 7 7  CREATININE 0.90 0.89 0.82 0.67 0.73  CALCIUM  7.9* 8.8* 8.6* 8.5* 7.7*  MG  --  1.7  --  1.6* 1.6*  PHOS  --  1.9*  --  3.9  --    GFR Estimated Creatinine Clearance: 81.6 mL/min (by C-G formula based on SCr of 0.73 mg/dL). Liver Function Tests: Recent Labs  Lab 01/11/24 1054 01/11/24 2109 01/12/24 0554 01/13/24 0618  AST 36 34 29 17  ALT 18 16 13 13   ALKPHOS 157* 155* 140* 147*  BILITOT 0.7 0.8 0.7 0.5  PROT 7.2 6.5 5.7* 5.8*  ALBUMIN  3.7 3.3* 2.9* 2.9*   Recent Labs  Lab 01/11/24 1054 01/13/24 0618 01/14/24 0509  LIPASE 151* 191* 132*   No results for input(s): AMMONIA in the last 168 hours. Coagulation profile Recent Labs  Lab 01/11/24 1054  INR 2.1*   COVID-19 Labs  Recent Labs    01/11/24 1054 01/11/24 2232 01/12/24 0032  DDIMER 2.38*  --   --   FERRITIN  --  339*  --   CRP  --   --  26.4*    Lab Results  Component Value Date   SARSCOV2NAA NEGATIVE 01/11/2024    CBC: Recent Labs  Lab 01/11/24 1054 01/11/24 1221 01/12/24 0554 01/13/24 0618 01/14/24 0509  WBC 21.0*  --  21.0* 17.9* 14.1*  NEUTROABS 16.7*  --   --   --   --   HGB 12.8 12.9 10.8* 11.1* 8.9*  HCT 37.3 38.0 32.0* 33.3* 27.1*  MCV 85.9  --  87.0 86.7 90.6  PLT 325  325  --  331 407* 363   Cardiac Enzymes: No results for input(s): CKTOTAL, CKMB, CKMBINDEX, TROPONINI in the last 168 hours. BNP (last 3 results) No results for input(s): PROBNP in the last  8760 hours. CBG: Recent Labs  Lab 01/13/24 1153 01/13/24 1642 01/13/24 2010 01/14/24 0024 01/14/24 0351  GLUCAP 202* 111* 132* 139* 101*   D-Dimer: Recent Labs    01/11/24 1054  DDIMER 2.38*   Hgb A1c: Recent Labs    01/11/24 2109  HGBA1C 10.6*   Lipid Profile: Recent Labs    01/12/24 1302  CHOL 140  HDL 30*  LDLCALC 78  TRIG 841*  CHOLHDL 4.6   Thyroid function studies: No results for input(s): TSH, T4TOTAL, T3FREE, THYROIDAB in the last 72 hours.  Invalid input(s): FREET3 Anemia work up: Recent Labs    01/11/24 2232  FERRITIN 339*   Sepsis Labs: Recent Labs  Lab 01/11/24 1054 01/11/24 1342 01/11/24 2109 01/11/24 2232 01/12/24 0332 01/12/24 0554 01/13/24 0618 01/14/24 0509  PROCALCITON  --   --  0.36  --   --   --   --   --  WBC 21.0*  --   --   --   --  21.0* 17.9* 14.1*  LATICACIDVEN 5.1*   < > 3.0* 3.6* 2.2* 3.0*  --   --    < > = values in this interval not displayed.   Microbiology Recent Results (from the past 240 hours)  Resp panel by RT-PCR (RSV, Flu A&B, Covid) Anterior Nasal Swab     Status: None   Collection Time: 01/11/24 10:14 AM   Specimen: Anterior Nasal Swab  Result Value Ref Range Status   SARS Coronavirus 2 by RT PCR NEGATIVE NEGATIVE Final    Comment: (NOTE) SARS-CoV-2 target nucleic acids are NOT DETECTED.  The SARS-CoV-2 RNA is generally detectable in upper respiratory specimens during the acute phase of infection. The lowest concentration of SARS-CoV-2 viral copies this assay can detect is 138 copies/mL. A negative result does not preclude SARS-Cov-2 infection and should not be used as the sole basis for treatment or other patient management decisions. A negative result may occur with  improper specimen collection/handling, submission of specimen other than nasopharyngeal swab, presence of viral mutation(s) within the areas targeted by this assay, and inadequate number of viral copies(<138 copies/mL). A  negative result must be combined with clinical observations, patient history, and epidemiological information. The expected result is Negative.  Fact Sheet for Patients:  bloggercourse.com  Fact Sheet for Healthcare Providers:  seriousbroker.it  This test is no t yet approved or cleared by the [place]  FDA and  has been authorized for detection and/or diagnosis of SARS-CoV-2 by FDA under an Emergency Use Authorization (EUA). This EUA will remain  in effect (meaning this test can be used) for the duration of the COVID-19 declaration under Section 564(b)(1) of the Act, 21 U.S.C.section 360bbb-3(b)(1), unless the authorization is terminated  or revoked sooner.       Influenza A by PCR NEGATIVE NEGATIVE Final   Influenza B by PCR NEGATIVE NEGATIVE Final    Comment: (NOTE) The Xpert Xpress SARS-CoV-2/FLU/RSV plus assay is intended as an aid in the diagnosis of influenza from Nasopharyngeal swab specimens and should not be used as a sole basis for treatment. Nasal washings and aspirates are unacceptable for Xpert Xpress SARS-CoV-2/FLU/RSV testing.  Fact Sheet for Patients: bloggercourse.com  Fact Sheet for Healthcare Providers: seriousbroker.it  This test is not yet approved or cleared by the [place]  FDA and has been authorized for detection and/or diagnosis of SARS-CoV-2 by FDA under an Emergency Use Authorization (EUA). This EUA will remain in effect (meaning this test can be used) for the duration of the COVID-19 declaration under Section 564(b)(1) of the Act, 21 U.S.C. section 360bbb-3(b)(1), unless the authorization is terminated or revoked.     Resp Syncytial Virus by PCR NEGATIVE NEGATIVE Final    Comment: (NOTE) Fact Sheet for Patients: bloggercourse.com  Fact Sheet for Healthcare  Providers: seriousbroker.it  This test is not yet approved or cleared by the [place]  FDA and has been authorized for detection and/or diagnosis of SARS-CoV-2 by FDA under an Emergency Use Authorization (EUA). This EUA will remain in effect (meaning this test can be used) for the duration of the COVID-19 declaration under Section 564(b)(1) of the Act, 21 U.S.C. section 360bbb-3(b)(1), unless the authorization is terminated or revoked.  Performed at [facility], [address] [place], [place] [address]   Culture, blood (routine x 2)     Status: None (Preliminary result)   Collection Time: 01/11/24 10:32 AM   Specimen:  BLOOD  Result Value Ref Range Status   Specimen Description   Final    BLOOD LEFT ANTECUBITAL Performed at [facility], [address] [place], [place] [address]    Special Requests   Final    BOTTLES DRAWN AEROBIC AND ANAEROBIC Blood Culture adequate volume Performed at [facility], [address] [place], [place] [address]    Culture   Final    NO GROWTH 2 DAYS Performed at [facility], [address], [place], [place] [address]    Report Status PENDING  Incomplete  MRSA Next Gen by PCR, Nasal     Status: None   Collection Time: 01/11/24  7:01 PM   Specimen: Nasal Mucosa; Nasal Swab  Result Value Ref Range Status   MRSA by PCR Next Gen NOT DETECTED NOT DETECTED Final    Comment: (NOTE) The GeneXpert MRSA Assay (FDA approved for NASAL specimens only), is one component of a comprehensive MRSA colonization surveillance program. It is not intended to diagnose MRSA infection nor to guide or monitor treatment for MRSA infections. Test performance is not FDA approved in patients less than 51 years old. Performed at [facility], [address], [place], [place] [address]   Culture, blood (routine x 2) Call MD if unable to obtain prior to antibiotics being given      Status: None (Preliminary result)   Collection Time: 01/11/24  9:09 PM   Specimen: BLOOD LEFT HAND  Result Value Ref Range Status   Specimen Description   Final    BLOOD LEFT HAND Performed at [facility], [address], [place], [place] [address]    Special Requests   Final    BOTTLES DRAWN AEROBIC ONLY Blood Culture results may not be optimal due to an inadequate volume of blood received in culture bottles Performed at [facility], [address] [place], [place] [address]    Culture   Final    NO GROWTH 2 DAYS Performed at [facility], [address] [place], [place] [address]    Report Status PENDING  Incomplete  Culture, blood (routine x 2) Call MD if unable to obtain prior to antibiotics being given     Status: None (Preliminary result)   Collection Time: 01/11/24  9:09 PM   Specimen: BLOOD LEFT HAND  Result Value Ref Range Status   Specimen Description   Final    BLOOD LEFT HAND Performed at [facility], [address], [place], [place] [address]    Special Requests   Final    BOTTLES DRAWN AEROBIC ONLY Blood Culture adequate volume Performed at [facility], [address], [place], [place] [address]    Culture   Final    NO GROWTH 2 DAYS Performed at [facility], [address], [place], [place] [address]    Report Status PENDING  Incomplete     Medications:    Chlorhexidine  Gluconate Cloth  6 each Topical Daily   cyanocobalamin   1,000 mcg Oral Daily   DULoxetine   120 mg Oral Daily   folic acid   1 mg Oral Daily   gabapentin   400 mg Oral TID   hydroxychloroquine   300 mg Oral Daily   insulin  aspart  0-15 Units Subcutaneous Q4H   insulin  glargine-yfgn  7 Units Subcutaneous Daily   ipratropium  0.5 mg Nebulization Q6H   lisinopril   5 mg Oral QHS  loratadine   10 mg Oral Daily   metoprolol  tartrate  25 mg Oral BID   montelukast   10 mg Oral QHS   pantoprazole   40 mg Oral Daily   polyethylene  glycol  17 g Oral Daily   Continuous Infusions:  sodium chloride  100 mL/hr at 01/14/24 0117   aztreonam  1 g (01/14/24 0608)   famotidine  (PEPCID ) IV Stopped (01/13/24 2221)   heparin  1,500 Units/hr (01/14/24 9390)   levofloxacin  (LEVAQUIN ) IV Stopped (01/13/24 1131)   promethazine  (PHENERGAN ) injection (IM or IVPB) Stopped (01/13/24 1231)      LOS: 3 days   Erle Odell Castor  [facility]  01/14/2024, 6:17 AM

## 2024-01-15 ENCOUNTER — Inpatient Hospital Stay (HOSPITAL_COMMUNITY)

## 2024-01-15 DIAGNOSIS — I2699 Other pulmonary embolism without acute cor pulmonale: Secondary | ICD-10-CM

## 2024-01-15 HISTORY — PX: IR THORACENTESIS ASP PLEURAL SPACE W/IMG GUIDE: IMG5380

## 2024-01-15 LAB — GLUCOSE, CAPILLARY
Glucose-Capillary: 102 mg/dL — ABNORMAL HIGH (ref 70–99)
Glucose-Capillary: 103 mg/dL — ABNORMAL HIGH (ref 70–99)
Glucose-Capillary: 133 mg/dL — ABNORMAL HIGH (ref 70–99)
Glucose-Capillary: 133 mg/dL — ABNORMAL HIGH (ref 70–99)
Glucose-Capillary: 94 mg/dL (ref 70–99)
Glucose-Capillary: 95 mg/dL (ref 70–99)

## 2024-01-15 LAB — CBC
HCT: 30.4 % — ABNORMAL LOW (ref 36.0–46.0)
Hemoglobin: 10.2 g/dL — ABNORMAL LOW (ref 12.0–15.0)
MCH: 29.9 pg (ref 26.0–34.0)
MCHC: 33.6 g/dL (ref 30.0–36.0)
MCV: 89.1 fL (ref 80.0–100.0)
Platelets: 489 K/uL — ABNORMAL HIGH (ref 150–400)
RBC: 3.41 MIL/uL — ABNORMAL LOW (ref 3.87–5.11)
RDW: 14.4 % (ref 11.5–15.5)
WBC: 18.6 K/uL — ABNORMAL HIGH (ref 4.0–10.5)
nRBC: 0.1 % (ref 0.0–0.2)

## 2024-01-15 LAB — HEPATIC FUNCTION PANEL
ALT: 10 U/L (ref 0–44)
AST: 15 U/L (ref 15–41)
Albumin: 2.7 g/dL — ABNORMAL LOW (ref 3.5–5.0)
Alkaline Phosphatase: 90 U/L (ref 38–126)
Bilirubin, Direct: 0.2 mg/dL (ref 0.0–0.2)
Indirect Bilirubin: 0.1 mg/dL — ABNORMAL LOW (ref 0.3–0.9)
Total Bilirubin: 0.3 mg/dL (ref 0.0–1.2)
Total Protein: 5.2 g/dL — ABNORMAL LOW (ref 6.5–8.1)

## 2024-01-15 LAB — RENAL FUNCTION PANEL
Albumin: 2.9 g/dL — ABNORMAL LOW (ref 3.5–5.0)
Anion gap: 11 (ref 5–15)
BUN: <5 mg/dL — ABNORMAL LOW (ref 6–20)
CO2: 22 mmol/L (ref 22–32)
Calcium: 8.6 mg/dL — ABNORMAL LOW (ref 8.9–10.3)
Chloride: 107 mmol/L (ref 98–111)
Creatinine, Ser: 0.66 mg/dL (ref 0.44–1.00)
GFR, Estimated: >60 mL/min (ref >60)
Glucose, Bld: 124 mg/dL — ABNORMAL HIGH (ref 70–99)
Phosphorus: 3.4 mg/dL (ref 2.5–4.6)
Potassium: 3.4 mmol/L — ABNORMAL LOW (ref 3.5–5.1)
Sodium: 140 mmol/L (ref 135–145)

## 2024-01-15 LAB — LACTATE DEHYDROGENASE: LDH: 120 U/L (ref 105–235)

## 2024-01-15 LAB — HEPARIN LEVEL (UNFRACTIONATED): Heparin Unfractionated: 0.42 [IU]/mL (ref 0.30–0.70)

## 2024-01-15 MED ORDER — HYDROMORPHONE HCL 1 MG/ML IJ SOLN: 2.0000 mg | INTRAMUSCULAR | Status: DC | PRN

## 2024-01-15 MED ORDER — METOPROLOL TARTRATE 50 MG PO TABS
50.0000 mg | ORAL_TABLET | Freq: Two times a day (BID) | ORAL | Status: DC
  Administered 2024-01-15 – 2024-01-18 (×6): 50 mg via ORAL
  Filled 2024-01-15 (×3): qty 1
  Filled 2024-01-15 (×3): qty 2

## 2024-01-15 MED ORDER — SODIUM CHLORIDE 0.9 % IV SOLN: 100.0000 mg | Freq: Two times a day (BID) | INTRAVENOUS | Status: DC

## 2024-01-15 MED ORDER — SODIUM CHLORIDE 0.9 % IV SOLN
3.0000 g | Freq: Four times a day (QID) | INTRAVENOUS | Status: DC
  Administered 2024-01-15 – 2024-01-18 (×11): 3 g via INTRAVENOUS
  Filled 2024-01-15 (×12): qty 8

## 2024-01-15 MED ORDER — HYDROMORPHONE HCL 1 MG/ML IJ SOLN
2.0000 mg | INTRAMUSCULAR | Status: DC | PRN
  Administered 2024-01-15: 20:00:00 2 mg via INTRAVENOUS
  Administered 2024-01-15: 16:00:00 1 mg via INTRAVENOUS
  Administered 2024-01-16: 02:00:00 2 mg via INTRAVENOUS
  Filled 2024-01-15 (×3): qty 2

## 2024-01-15 MED ORDER — POTASSIUM CHLORIDE CRYS ER 20 MEQ PO TBCR
40.0000 meq | EXTENDED_RELEASE_TABLET | Freq: Two times a day (BID) | ORAL | Status: AC
  Administered 2024-01-15 (×2): 40 meq via ORAL
  Filled 2024-01-15 (×2): qty 2

## 2024-01-15 MED ORDER — LIDOCAINE-EPINEPHRINE 1 %-1:100000 IJ SOLN
INTRAMUSCULAR | Status: AC
  Filled 2024-01-15: qty 1

## 2024-01-15 MED ORDER — SODIUM CHLORIDE 0.9 % IV SOLN: 1.5000 g | Freq: Four times a day (QID) | INTRAVENOUS | Status: DC

## 2024-01-15 MED ORDER — LORAZEPAM 0.5 MG PO TABS
0.5000 mg | ORAL_TABLET | Freq: Once | ORAL | Status: AC | PRN
  Administered 2024-01-15: 15:00:00 0.5 mg via ORAL
  Filled 2024-01-15: qty 1

## 2024-01-15 MED ORDER — SODIUM CHLORIDE 0.9 % IV SOLN
100.0000 mg | Freq: Two times a day (BID) | INTRAVENOUS | Status: DC
  Administered 2024-01-15 – 2024-01-18 (×6): 100 mg via INTRAVENOUS
  Filled 2024-01-15 (×7): qty 100

## 2024-01-15 MED ORDER — SODIUM CHLORIDE 0.9 % IV SOLN: INTRAVENOUS | Status: AC

## 2024-01-15 MED ORDER — HYDROMORPHONE HCL 1 MG/ML IJ SOLN
1.0000 mg | INTRAMUSCULAR | Status: DC | PRN
  Administered 2024-01-15 (×3): 1 mg via INTRAVENOUS
  Filled 2024-01-15 (×3): qty 1

## 2024-01-15 NOTE — Consult Note (Addendum)
 [facility]    Date of Admission:  01/11/2024     Reason for Consult: pancreatitis/pna    Referring Provider: Odell Castor     Lines:  Peripheral iv's  Abx: 01/11/24-c levo 01/11/24-c aztreonam         Assessment: 48 y.o. 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 Do not believe this early that she would have pancreatitis related intraabdominal infectious process. Hx of nausea and while could be pancreatitis would check urine legionella antigen. If positive might need longer course of tx with doxy   Plan: Would change abx to doxy/amp-sulb Urine legionella antigen Primary team had ordered thoracentesis -- recommend culture and light's criteria labs She has been on depoprovera for 10 years; in setting recurrent dvt/pe would stop and she'll need to discuss with her oncologist  On xarelto ? Prior to admission, and if still having pe/dvt on xarelta could consider discussing with hematologist/oncology regarding ivc filter as she is becoming tenuous with respiratory status  Maintain standard isolation precaution Discuss with primary team     ------------------------------------------------ Principal Problem:   Sepsis (HCC) Active Problems:   Community acquired pneumonia   Idiopathic acute pancreatitis without infection or necrosis    HPI: Megan Weber is a 48 y.o. 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 She developed acute on set mid/left back pain and severe nausea/vomiting. She mentioned >70 bouts a day although I can't quite remember if 7 or 17  This is associated with subjective f/c/nightsweat  On day 3 she developed also bilateral chest pain and dyspnea. She went to pcp and was referred for admission on 01/11/24. She reports also associated cough by day 3  Had wbc 21 on presentation, febrile 101s Initial imaging chest ct abd pelv showed pancreatitis, and bilateral PE non-massive Bcx negative Mrsa nares screen negative  She was started on aztreonam /levoflox in setting cefuroxime chart allergy  She continues to have chest pain and worsening dyspnea. Repeat ct abd pelv showed large pleural effusion  She is now on supplemental oxygen  No rash, joint pain, headache, diarrhea  She tolerated amoxicillin, cephalexin  recent past   Family History  Problem Relation Age of Onset   Diabetes Mellitus II Father     Social History[1]  Allergies[2]  Review of Systems: ROS All Other ROS was negative, except mentioned above   Past Medical History:  Diagnosis Date   Abnormal glucose    Adjustment disorder    Asplenia    Cerebral aneurysm    Cerebral degeneration associated with generalized lipidosis    Depression    Diabetes mellitus without complication (HCC)    Hypertension    OCD (obsessive compulsive disorder)    Peripheral neuropathy        Scheduled Meds:  Chlorhexidine  Gluconate Cloth  6 each Topical Daily   cyanocobalamin   1,000 mcg Oral Daily   DULoxetine   120 mg Oral Daily   folic acid   1 mg Oral Daily   gabapentin   400 mg Oral TID   hydroxychloroquine   300 mg Oral Daily   insulin  aspart   0-15 Units Subcutaneous Q4H   insulin  glargine-yfgn  7 Units Subcutaneous Daily   insulin  starter kit- pen needles  1 kit Other Once   living well with diabetes book   Does not apply Once   loratadine   10 mg Oral Daily   metoprolol  tartrate  50 mg Oral BID   montelukast   10 mg Oral QHS   pantoprazole   40 mg Oral Daily   potassium chloride   40 mEq Oral BID   Continuous Infusions:  sodium chloride  Stopped (01/15/24 1336)   sodium chloride  75 mL/hr at 01/15/24 1503   ampicillin -sulbactam (UNASYN ) IV     doxycycline  (VIBRAMYCIN ) IV     famotidine  (PEPCID ) IV Stopped (01/15/24 1106)   heparin  1,500 Units/hr (01/15/24 1503)   promethazine  (PHENERGAN ) injection (IM or IVPB) Stopped (01/13/24 1231)   PRN Meds:.acetaminophen , albuterol , dextrose , HYDROmorphone  (DILAUDID ) injection, labetalol , ondansetron  **OR** ondansetron  (ZOFRAN ) IV, mouth rinse, promethazine  (PHENERGAN ) injection (IM or IVPB), tiZANidine    OBJECTIVE: Blood pressure (!) 142/85, pulse 96, temperature 98 F (36.7 C), temperature source Oral, resp. rate (!) 23, height 5' 1 (1.549 m), weight 75.3 kg, SpO2 95%.  Physical Exam  General/constitutional: no distress, pleasant HEENT: Normocephalic, PER, Conj Clear, EOMI, Oropharynx clear Neck supple CV: rrr no mrg Lungs: on 2 liters o2 Abd: Soft, Nontender Ext: no edema Skin: No Rash Neuro: nonfocal MSK: no peripheral joint swelling/tenderness/warmth; back spines nontender     Lab Results Lab Results  Component Value Date   WBC 18.6 (H) 01/15/2024   HGB 10.2 (L) 01/15/2024   HCT 30.4 (L) 01/15/2024   MCV 89.1 01/15/2024   PLT 489 (H) 01/15/2024    Lab Results  Component Value Date   CREATININE 0.66 01/15/2024   BUN <5 (L) 01/15/2024   NA 140 01/15/2024   K 3.4 (L) 01/15/2024   CL 107 01/15/2024   CO2 22 01/15/2024    Lab Results  Component Value Date   ALT 10 01/15/2024   AST 15 01/15/2024   ALKPHOS 90 01/15/2024   BILITOT 0.3 01/15/2024       Microbiology: Recent Results (from the past 240 hours)  Resp panel by RT-PCR (RSV, Flu A&B, Covid) Anterior Nasal Swab     Status: None   Collection Time: 01/11/24 10:14 AM   Specimen: Anterior Nasal Swab  Result Value Ref Range Status   SARS Coronavirus 2 by RT PCR NEGATIVE NEGATIVE Final    Comment: (NOTE) SARS-CoV-2 target nucleic acids are NOT DETECTED.  The SARS-CoV-2 RNA is generally detectable in upper respiratory specimens during the acute phase of infection. The lowest concentration of SARS-CoV-2 viral copies this assay can detect is 138 copies/mL. A negative result does not preclude SARS-Cov-2 infection and should not be used as the sole basis for treatment or other patient management decisions. A negative result may occur with  improper specimen collection/handling, submission of specimen other than nasopharyngeal swab, presence of viral mutation(s) within the areas targeted by this assay, and inadequate number of viral copies(<138 copies/mL). A negative result must be combined with clinical observations, patient history, and epidemiological information. The expected result is Negative.  Fact Sheet for Patients:  bloggercourse.com  Fact Sheet for  Healthcare Providers:  seriousbroker.it  This test is no t yet approved or cleared by the [place]  FDA and  has been authorized for detection and/or diagnosis of SARS-CoV-2 by FDA under an Emergency Use Authorization (EUA). This EUA will remain  in effect (meaning this test can be used) for the duration of the COVID-19 declaration under Section 564(b)(1) of the Act, 21 U.S.C.section 360bbb-3(b)(1), unless the authorization is terminated  or revoked sooner.       Influenza A by PCR NEGATIVE NEGATIVE Final   Influenza B by PCR NEGATIVE NEGATIVE Final    Comment: (NOTE) The Xpert Xpress SARS-CoV-2/FLU/RSV plus assay is intended as an aid in the diagnosis of  influenza from Nasopharyngeal swab specimens and should not be used as a sole basis for treatment. Nasal washings and aspirates are unacceptable for Xpert Xpress SARS-CoV-2/FLU/RSV testing.  Fact Sheet for Patients: bloggercourse.com  Fact Sheet for Healthcare Providers: seriousbroker.it  This test is not yet approved or cleared by the [place]  FDA and has been authorized for detection and/or diagnosis of SARS-CoV-2 by FDA under an Emergency Use Authorization (EUA). This EUA will remain in effect (meaning this test can be used) for the duration of the COVID-19 declaration under Section 564(b)(1) of the Act, 21 U.S.C. section 360bbb-3(b)(1), unless the authorization is terminated or revoked.     Resp Syncytial Virus by PCR NEGATIVE NEGATIVE Final    Comment: (NOTE) Fact Sheet for Patients: bloggercourse.com  Fact Sheet for Healthcare Providers: seriousbroker.it  This test is not yet approved or cleared by the [place]  FDA and has been authorized for detection and/or diagnosis of SARS-CoV-2 by FDA under an Emergency Use Authorization (EUA). This EUA will remain in effect (meaning this test can be used) for the duration of the COVID-19 declaration under Section 564(b)(1) of the Act, 21 U.S.C. section 360bbb-3(b)(1), unless the authorization is terminated or revoked.  Performed at [facility], [address] [place], [place] [address]   Culture, blood (routine x 2)     Status: None (Preliminary result)   Collection Time: 01/11/24 10:32 AM   Specimen: BLOOD  Result Value Ref Range Status   Specimen Description   Final    BLOOD LEFT ANTECUBITAL Performed at [facility], [address] [place], [place] [address]    Special Requests   Final    BOTTLES DRAWN AEROBIC AND ANAEROBIC Blood Culture adequate volume Performed at [facility],  [address] [place], [place] [address]    Culture   Final    NO GROWTH 4 DAYS Performed at [facility], [address] [place], [place] [address]    Report Status PENDING  Incomplete  MRSA Next Gen by PCR, Nasal     Status: None   Collection Time: 01/11/24  7:01 PM   Specimen: Nasal Mucosa; Nasal Swab  Result Value Ref Range Status   MRSA by PCR Next Gen NOT DETECTED NOT DETECTED Final    Comment: (NOTE) The GeneXpert MRSA Assay (FDA approved for NASAL specimens only), is one component of a comprehensive MRSA colonization surveillance program. It is not intended to diagnose MRSA infection nor to guide or monitor treatment for MRSA infections. Test performance is not FDA approved in patients less than 58 years old. Performed at  [facility], [address], [place], [place] [address]   Culture, blood (routine x 2) Call MD if unable to obtain prior to antibiotics being given  Status: None (Preliminary result)   Collection Time: 01/11/24  9:09 PM   Specimen: BLOOD LEFT HAND  Result Value Ref Range Status   Specimen Description   Final    BLOOD LEFT HAND Performed at [facility], [address] [place], [place] [address]    Special Requests   Final    BOTTLES DRAWN AEROBIC ONLY Blood Culture results may not be optimal due to an inadequate volume of blood received in culture bottles Performed at [facility], [address], [place], [place] [address]    Culture   Final    NO GROWTH 4 DAYS Performed at [facility], [address], [place], [place] [address]    Report Status PENDING  Incomplete  Culture, blood (routine x 2) Call MD if unable to obtain prior to antibiotics being given     Status: None (Preliminary result)   Collection Time: 01/11/24  9:09 PM   Specimen: BLOOD LEFT HAND  Result Value Ref Range Status   Specimen Description   Final    BLOOD LEFT HAND Performed at [facility], [address], [place], [place] [address]    Special Requests   Final    BOTTLES DRAWN AEROBIC ONLY Blood Culture adequate volume Performed at [facility], [address], [place], [place] [address]    Culture   Final    NO GROWTH 4 DAYS Performed at [facility], [address] [place], [place] [address]    Report Status PENDING  Incomplete     Serology:    Imaging: If present, new imagings (plain films, ct scans, and mri) have been personally visualized and interpreted; radiology reports have been reviewed. Decision making incorporated into the Impression / Recommendations.   12/16 ct abd pelv with contrast 1. No acute findings in the abdomen and pelvis. 2. Hepatic steatosis. 3. Large left pleural effusion with passive atelectasis of the left lower lobe.   12/14 u/s abd No evidence of cholelithiasis or biliary ductal dilatation.   Diffuse hepatic steatosis.   12/12 chest abd pelv ct with pe protocol 1. Acute bilateral pulmonary emboli including some lobar artery involvement (RV/LV ratio 0.71, no obvious heart strain). Possible left common femoral vein deep venous thrombosis; Ultrasound would be necessary to confirm. 2. Superimposed multifocal peribronchial and streaky lung opacity, more consistent with bronchopneumonia/infection than pulmonary infarction. No pleural effusion. 3. Evidence of Acute Pancreatitis limited to the pancreatic tail. 4. Fluid and possible mucosal hyperenhancement throughout nondilated small and large bowel loops, suggesting acute enteritis / diarrhea. Normal appendix. 5. Chronically absent spleen. Chronic hepatic steatosis, calcified aortic atherosclerosis.    Constance ONEIDA Passer, MD [facility] [phone] pager    01/15/2024, 3:27 PM     [1]  Social History Tobacco Use   Smoking status: Never   Smokeless tobacco: Never  Substance Use Topics   Alcohol use: No   Drug  use: No  [2]  Allergies Allergen Reactions   Ceftin [Cefuroxime Axetil] Shortness Of Breath and Swelling   Cinobac [Cinoxacin] Shortness Of Breath and Swelling    Has received Levaquin  in inpt & outpt setting   Fire Ant Anaphylaxis   Sulfa Antibiotics Other (See Comments)    Unknown

## 2024-01-15 NOTE — TOC Initial Note (Signed)
 Transition of Care [facility]) - Initial/Assessment Note    Patient Details  Name: Megan Weber MRN: [id number] Date of Birth: 1975/02/03  Transition [facility] Care [facility] CM/SW Contact:    Jon ONEIDA Anon, RN Phone Number: 01/15/2024, 2:34 PM  Clinical Narrative:                 Pt is from home. Pt came from PCP office to the ED with complaints of vomiting, chest tightness, and cough for 3 days. Pt needing continued medical workup, not medically stable for discharge. ICM will follow for any DC planning needs.    Expected Discharge Plan: Home/Self Care Barriers to Discharge: Continued Medical Work up   Patient Goals and CMS Choice Patient states their goals for this hospitalization and ongoing recovery are:: Return home CMS Medicare.gov Compare Post Acute Care list provided to:: Patient Choice offered to / list presented to : Patient [place] ownership interest in [facility].provided to:: Patient    Expected Discharge Plan and Services In-house Referral: NA Discharge Planning Services: CM Consult Post Acute Care Choice: NA Living arrangements for the past 2 months: Single Family Home                 DME Arranged: N/A DME Agency: NA       HH Arranged: NA HH Agency: NA        Prior Living Arrangements/Services Living arrangements for the past 2 months: Single Family Home Lives with:: Self Patient language and need for interpreter reviewed:: Yes Do you feel safe going back to the place where you live?: Yes      Need for Family Participation in Patient Care: Yes (Comment) Care giver support system in place?: Yes (comment) Current home services: Other (comment) (NA) Criminal Activity/Legal Involvement Pertinent to Current Situation/Hospitalization: No - Comment as needed  Activities of Daily Living   ADL Screening (condition at time of admission) Independently performs ADLs?: No Does the patient have a NEW difficulty with bathing/dressing/toileting/self-feeding  that is expected to last >3 days?: No Does the patient have a NEW difficulty with getting in/out of bed, walking, or climbing stairs that is expected to last >3 days?: No Does the patient have a NEW difficulty with communication that is expected to last >3 days?: No Is the patient deaf or have difficulty hearing?: No Does the patient have difficulty seeing, even when wearing glasses/contacts?: No Does the patient have difficulty concentrating, remembering, or making decisions?: No  Permission Sought/Granted                  Emotional Assessment Appearance:: Appears stated age Attitude/Demeanor/Rapport: Unable to Assess Affect (typically observed): Unable to Assess Orientation: : Oriented to Self, Oriented to Place, Oriented to  Time, Oriented to Situation Alcohol / Substance Use: Not Applicable Psych Involvement: No (comment)  Admission diagnosis:  Sepsis (HCC) [A41.9] Other acute pulmonary embolism without acute cor pulmonale (HCC) [I26.99] Acute pancreatitis without infection or necrosis, unspecified pancreatitis type [K85.90] Metabolic acidosis due to diabetes 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] Sepsis, due to unspecified organism, unspecified whether acute organ dysfunction present [facility]) [A41.9] Patient Active Problem List   Diagnosis Date Noted   Idiopathic acute pancreatitis without infection or necrosis 01/12/2024   Niemann-Pick disease 04/10/2017   Type 2 diabetes mellitus with obesity 04/10/2017   PNA (pneumonia) 04/10/2017   Community acquired pneumonia    Sepsis (HCC) 04/09/2017   ANEMIA, IRON DEFICIENCY 03/30/2010   Leukocytosis 03/30/2010   PANIC ATTACK 03/30/2010   Obsessive-compulsive disorder 03/30/2010  Adjustment reaction 03/30/2010   DEPRESSION 03/30/2010   OTHER SPECIFIED HEADACHE SYNDROMES 03/30/2010   Essential hypertension 03/30/2010   Acute upper respiratory infection 03/30/2010   ASPLENIA 03/30/2010   INSOMNIA 03/30/2010   LUQ PAIN 03/30/2010   PCP:   Pia Kerney SQUIBB, MD Pharmacy:   [facility] [place], [place] - [address] [place] [address] Phone: [phone] Fax: [phone]     Social Drivers of Health (SDOH) Social History: SDOH Screenings   Food Insecurity: No Food Insecurity (01/12/2024)  Housing: Low Risk (01/12/2024)  Transportation Needs: No Transportation Needs (01/12/2024)  Utilities: Not At Risk (01/12/2024)  Financial Resource Strain: High Risk (09/13/2021)   Received from [facility]  Stress: Stress Concern Present (09/13/2021)   Received from [facility]  Tobacco Use: Low Risk (01/11/2024)   SDOH Interventions:     Readmission Risk Interventions    01/15/2024    2:18 PM 01/14/2024    3:49 PM  Readmission Risk Prevention Plan  Transportation Screening Complete Complete  PCP or Specialist Appt within 3-5 Days Complete Complete  HRI or Home Care Consult Complete Complete  Social Work Consult for Recovery Care Planning/Counseling Complete Complete  Palliative Care Screening Not Applicable Not Applicable  Medication Review [employer]) Complete Complete

## 2024-01-15 NOTE — Plan of Care (Signed)
 BP was high earlier this shift, but gradually normalized after scheduled Metoprolol  took effect.  No new issues at this time.   Problem: Education: Goal: Knowledge of General Education information will improve Description: Including pain rating scale, medication(s)/side effects and non-pharmacologic comfort measures Outcome: Progressing   Problem: Health Behavior/Discharge Planning: Goal: Ability to manage health-related needs will improve Outcome: Progressing   Problem: Clinical Measurements: Goal: Ability to maintain clinical measurements within normal limits will improve Outcome: Progressing Goal: Will remain free from infection Outcome: Progressing Goal: Diagnostic test results will improve Outcome: Progressing Goal: Respiratory complications will improve Outcome: Progressing Goal: Cardiovascular complication will be avoided Outcome: Progressing   Problem: Activity: Goal: Risk for activity intolerance will decrease Outcome: Progressing   Problem: Nutrition: Goal: Adequate nutrition will be maintained Outcome: Progressing   Problem: Coping: Goal: Level of anxiety will decrease Outcome: Progressing   Problem: Elimination: Goal: Will not experience complications related to bowel motility Outcome: Progressing Goal: Will not experience complications related to urinary retention Outcome: Progressing   Problem: Pain Managment: Goal: General experience of comfort will improve and/or be controlled Outcome: Progressing   Problem: Safety: Goal: Ability to remain free from injury will improve Outcome: Progressing   Problem: Skin Integrity: Goal: Risk for impaired skin integrity will decrease Outcome: Progressing   Problem: Activity: Goal: Ability to tolerate increased activity will improve Outcome: Progressing   Problem: Clinical Measurements: Goal: Ability to maintain a body temperature in the normal range will improve Outcome: Progressing   Problem:  Respiratory: Goal: Ability to maintain adequate ventilation will improve Outcome: Progressing Goal: Ability to maintain a clear airway will improve Outcome: Progressing   Problem: Education: Goal: Ability to describe self-care measures that may prevent or decrease complications (Diabetes Survival Skills Education) will improve Outcome: Progressing Goal: Individualized Educational Video(s) Outcome: Progressing   Problem: Coping: Goal: Ability to adjust to condition or change in health will improve Outcome: Progressing   Problem: Fluid Volume: Goal: Ability to maintain a balanced intake and output will improve Outcome: Progressing   Problem: Health Behavior/Discharge Planning: Goal: Ability to identify and utilize available resources and services will improve Outcome: Progressing Goal: Ability to manage health-related needs will improve Outcome: Progressing   Problem: Metabolic: Goal: Ability to maintain appropriate glucose levels will improve Outcome: Progressing   Problem: Nutritional: Goal: Maintenance of adequate nutrition will improve Outcome: Progressing Goal: Progress toward achieving an optimal weight will improve Outcome: Progressing   Problem: Skin Integrity: Goal: Risk for impaired skin integrity will decrease Outcome: Progressing   Problem: Tissue Perfusion: Goal: Adequacy of tissue perfusion will improve Outcome: Progressing

## 2024-01-15 NOTE — Progress Notes (Signed)
 [facility] PROGRESS NOTE    Progress Note  Megan Weber  [name] DOB: 07/21/1975 DOA: 01/11/2024 PCP: Pia Kerney SQUIBB, MD   Brief Narrative:   Megan Weber is an 48 y.o. 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  In the ED was found to be septic with pneumonia and an acute PE.  Assessment/Plan:   Sepsis (HCC) due to multifocal pneumonia: Continue IV levofloxacin  and aztreonam  she has defervesced leukocytosis improved. Tachycardia has resolved satting well on room air.   Tmax of 100.3. Will complete a 7-day course  New acute bilateral pulmonary embolism/she does have a history of PE: Likely due to noncompliance with her Xarelto . 2D echo showed an EF of 60% grade 1 diastolic dysfunction There is been a mild drop in her hemoglobin, CBC is pending no signs of overt bleeding recheck CBC today. She had a bowel movement yesterday she relates no bloody or melanotic stools.  Diabetes mellitus type 2 uncontrolled with hyperglycemia/DKA: On admission blood glucose of 500 bicarbonate of 16 and gap of 20. She does not use a insulin  at home she is just on metformin. A1c of 10.0. Blood glucose well-controlled this morning, she is currently n.p.o. for pancreatitis.  Acute Idiopathic acute pancreatitis without infection or necrosis Lipase was elevated, abdominal ultrasound showed no cholelithiasis or biliary ductal dilation such as diffuse hepatic steatosis She continues to have abdominal pain, she tried to eat yesterday and her pain got worse so she was placed back n.p.o. and continued IV narcotics. Started on normal  saline, monitor strict I's and O's. Will check CT scan of the abdomen pelvis Etiology remains unclear.  Triglycerides were less than 500. Noted that Abilify can cause pancreatitis and currently holding. Need to follow-up with GI as an outpatient.  Mild acute kidney injury: Now resolved.  Hypokalemia/hypomagnesemia:  Try to keep potassium greater than 4 magnesium  greater than 2. Renal panel this morning.  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 Gets Xenopozyme infusions every month.   Systemic mastocytosis: Followed by [facility] at home on immunotherapy these were held due to multifocal pneumonia. Can be resumed as an outpatient.  Essential hypertension: The pressure was low overnight received a bolus with albumin  continue metoprolol . Hold lisinopril .  History of cerebral aneurysm: No findings.  Morbid obesity: Noted she has been counseled.  DVT prophylaxis: lovenox  Family Communication:none Status is: Inpatient Remains inpatient appropriate because: Abscess due to multifocal pneumonia    Code Status:     Code Status Orders  (From admission, onward)           Start     Ordered   01/11/24 2024  Full code  Continuous       Question:  By:  Answer:  Default: patient does not have capacity for decision making, no surrogate or prior directive available   01/11/24 2026           Code Status History     Date Active Date Inactive Code Status Order ID Comments User Context   04/10/2017 0432 04/10/2017 1610 Full Code [id number]  Franky Redia SAILOR, MD Inpatient         IV Access:   Peripheral IV   Procedures and diagnostic studies:  ECHOCARDIOGRAM COMPLETE Result Date: 01/13/2024    ECHOCARDIOGRAM REPORT   Patient Name:   Megan Weber Date of Exam: 01/13/2024 Medical Rec #:  [id number]       Height:       61.0 in Accession #:    [id number]      Weight:       166.0 lb Date of Birth:  13-Sep-1975       BSA:          1.745 m Patient Age:     48 years        BP:           166/99 mmHg Patient Gender: F               HR:           92 bpm. Exam Location:  Inpatient Procedure: 2D Echo, Cardiac Doppler and Color Doppler (Both Spectral and Color            Flow Doppler were utilized during procedure). Indications:    Pulmonary Embolus  History:        Patient has no prior history of Echocardiogram examinations.                 Risk Factors:Hypertension and Diabetes.  Sonographer:    Philomena Daring Referring Phys: [id number] PAULA SOUTHERLY IMPRESSIONS  1. Left ventricular ejection fraction, by estimation, is 60 to 65%. The left ventricle has normal function. The left ventricle has no regional wall motion abnormalities. There is mild left ventricular hypertrophy. Left ventricular diastolic parameters are consistent with Grade I diastolic dysfunction (impaired relaxation).  2. Right ventricular systolic function is low normal. The right ventricular size is normal. Tricuspid regurgitation signal is inadequate for assessing PA pressure.  3. The mitral valve is grossly normal. No evidence of mitral valve regurgitation.  4. The aortic valve is tricuspid. Aortic valve regurgitation is not visualized. No aortic stenosis is present.  5. The inferior vena cava is normal in size with <50% respiratory variability, suggesting right atrial pressure of 8 mmHg. Comparison(s): No prior Echocardiogram. No right heart strain. FINDINGS  Left Ventricle: Left ventricular ejection fraction, by estimation, is 60 to 65%. The left ventricle has normal function. The left ventricle has no regional wall motion abnormalities. The left ventricular internal cavity size was normal in size. There is  mild left ventricular hypertrophy. Left ventricular diastolic parameters are consistent with Grade I diastolic dysfunction (impaired relaxation). Indeterminate filling pressures. Right Ventricle: The right ventricular size is normal. No increase in right ventricular wall thickness. Right ventricular  systolic function is low normal. Tricuspid regurgitation signal is inadequate for assessing PA pressure. Left [facility]: Left atrial size was normal in size. Right [facility]: Right atrial size was normal in size. Pericardium: There is no evidence of pericardial effusion. Mitral Valve: The mitral valve is grossly normal. No evidence of mitral valve regurgitation. Tricuspid Valve: The tricuspid valve is normal in structure. Tricuspid valve regurgitation is not demonstrated. Aortic Valve: The aortic valve is tricuspid. Aortic valve regurgitation is not visualized. No aortic stenosis is present. Pulmonic Valve: The pulmonic valve was normal in structure. Pulmonic valve regurgitation is not visualized. Aorta: The aortic root and ascending aorta are structurally normal, with no evidence of dilitation. Venous: The inferior vena cava is normal in size with less than 50% respiratory variability, suggesting right atrial pressure of 8 mmHg. IAS/Shunts: No atrial level shunt detected by color flow Doppler.  LEFT VENTRICLE PLAX 2D LVIDd:  3.30 cm   Diastology LVIDs:         2.40 cm   LV e' medial:    8.38 cm/s LV PW:         1.10 cm   LV E/e' medial:  10.1 LV IVS:        1.20 cm   LV e' lateral:   9.79 cm/s LVOT diam:     2.10 cm   LV E/e' lateral: 8.7 LV SV:         48 LV SV Index:   28 LVOT Area:     3.46 cm LV IVRT:       58 msec  RIGHT VENTRICLE             IVC RV Basal diam:  2.60 cm     IVC diam: 1.50 cm RV Mid diam:    2.30 cm RV S prime:     10.10 cm/s TAPSE (M-mode): 1.8 cm LEFT ATRIUM             Index        RIGHT ATRIUM           Index LA diam:        2.30 cm 1.32 cm/m   RA Area:     12.50 cm LA Vol (A2C):   31.4 ml 17.99 ml/m  RA Volume:   26.00 ml  14.90 ml/m LA Vol (A4C):   29.5 ml 16.91 ml/m LA Biplane Vol: 30.9 ml 17.71 ml/m  AORTIC VALVE LVOT Vmax:   100.00 cm/s LVOT Vmean:  67.300 cm/s LVOT VTI:    0.140 m  AORTA Ao Root diam: 2.90 cm Ao Asc diam:  2.70 cm MITRAL VALVE MV Area (PHT): 7.44 cm      SHUNTS MV Decel Time: 102 msec     Systemic VTI:  0.14 m MV E velocity: 84.80 cm/s   Systemic Diam: 2.10 cm MV A velocity: 100.00 cm/s MV E/A ratio:  0.85 Vinie Maxcy MD Electronically signed by Vinie Maxcy MD Signature Date/Time: 01/13/2024/3:55:04 PM    Final    US  Abdomen Complete Result Date: 01/13/2024 CLINICAL DATA:  Abdominal pain.  Pancreatitis.  Prior splenectomy. EXAM: ABDOMEN ULTRASOUND COMPLETE COMPARISON:  CT on 01/11/2024 FINDINGS: Gallbladder: No gallstones or wall thickening visualized. No sonographic Murphy sign noted by sonographer. Common bile duct: Diameter: 3 mm, within normal limits. Liver: Diffusely increased parenchymal echogenicity, consistent with steatosis. No focal liver lesion identified. Portal vein is patent on color Doppler imaging with normal direction of blood flow towards the liver. IVC: No abnormality visualized. Pancreas: Not well visualized due to overlying bowel gas. Spleen: Surgically absent. Right Kidney: Length: 10.0 cm. Echogenicity within normal limits. No mass or hydronephrosis visualized. Left Kidney: Length: 10.6 cm. Echogenicity within normal limits. No mass or hydronephrosis visualized. Abdominal aorta: No aneurysm visualized. Other findings: None. IMPRESSION: No evidence of cholelithiasis or biliary ductal dilatation. Diffuse hepatic steatosis. Electronically Signed   By: Norleen DELENA Kil M.D.   On: 01/13/2024 10:24     Medical Consultants:   None.   Subjective:    Megan Weber relieved that every time she tries to eat she start having worsening abdominal pain.  Objective:    Vitals:   01/15/24 0200 01/15/24 0323 01/15/24 0348 01/15/24 0600  BP: 132/77 (!) 120/53 123/63 131/75  Pulse: (!) 107 100 (!) 101 95  Resp: (!) 26 (!) 30 (!) 28 (!) 23  Temp:  99.9 F (37.7 C)    TempSrc:  Axillary    SpO2: (!) 88% 94% 96% 100%  Weight:      Height:       SpO2: 100 % O2 Flow Rate (L/min): 2 L/min   Intake/Output Summary (Last 24 hours) at  01/15/2024 0717 Last data filed at 01/15/2024 0610 Gross per 24 hour  Intake 1986.14 ml  Output 2600 ml  Net -613.86 ml   Filed Weights   01/11/24 1008  Weight: 75.3 kg    Exam: General exam: In no acute distress. Respiratory system: Good air movement and clear to auscultation. Cardiovascular system: S1 & S2 heard, RRR. No JVD. Gastrointestinal system: Positive bowel sound soft nontender nondistended Extremities: No pedal edema. Skin: No rashes, lesions or ulcers Psychiatry: Judgement and insight appear normal. Mood & affect appropriate. Data Reviewed:    Labs: Basic Metabolic Panel: Recent Labs  Lab 01/11/24 1359 01/11/24 2109 01/12/24 0554 01/13/24 0618 01/14/24 0509  NA 136 135 139 138 140  K 3.4* 3.4* 3.4* 3.2* 3.9  CL 103 103 107 103 112*  CO2 17* 16* 21* 24 21*  GLUCOSE 293* 292* 203* 252* 103*  BUN 13 9 5* 7 7  CREATININE 0.90 0.89 0.82 0.67 0.73  CALCIUM  7.9* 8.8* 8.6* 8.5* 7.7*  MG  --  1.7  --  1.6* 1.6*  PHOS  --  1.9*  --  3.9  --    GFR Estimated Creatinine Clearance: 81.6 mL/min (by C-G formula based on SCr of 0.73 mg/dL). Liver Function Tests: Recent Labs  Lab 01/11/24 1054 01/11/24 2109 01/12/24 0554 01/13/24 0618  AST 36 34 29 17  ALT 18 16 13 13   ALKPHOS 157* 155* 140* 147*  BILITOT 0.7 0.8 0.7 0.5  PROT 7.2 6.5 5.7* 5.8*  ALBUMIN  3.7 3.3* 2.9* 2.9*   Recent Labs  Lab 01/11/24 1054 01/13/24 0618 01/14/24 0509  LIPASE 151* 191* 132*   No results for input(s): AMMONIA in the last 168 hours. Coagulation profile Recent Labs  Lab 01/11/24 1054  INR 2.1*   COVID-19 Labs  No results for input(s): DDIMER, FERRITIN, LDH, CRP in the last 72 hours.   Lab Results  Component Value Date   SARSCOV2NAA NEGATIVE 01/11/2024    CBC: Recent Labs  Lab 01/11/24 1054 01/11/24 1221 01/12/24 0554 01/13/24 0618 01/14/24 0509  WBC 21.0*  --  21.0* 17.9* 14.1*  NEUTROABS 16.7*  --   --   --   --   HGB 12.8 12.9 10.8* 11.1*  8.9*  HCT 37.3 38.0 32.0* 33.3* 27.1*  MCV 85.9  --  87.0 86.7 90.6  PLT 325  325  --  331 407* 363   Cardiac Enzymes: No results for input(s): CKTOTAL, CKMB, CKMBINDEX, TROPONINI in the last 168 hours. BNP (last 3 results) No results for input(s): PROBNP in the last 8760 hours. CBG: Recent Labs  Lab 01/14/24 1123 01/14/24 1557 01/14/24 2036 01/14/24 2354 01/15/24 0357  GLUCAP 202* 114* 115* 132* 133*   D-Dimer: No results for input(s): DDIMER in the last 72 hours.  Hgb A1c: No results for input(s): HGBA1C in the last 72 hours.  Lipid Profile: Recent Labs    01/12/24 1302  CHOL 140  HDL 30*  LDLCALC 78  TRIG 841*  CHOLHDL 4.6   Thyroid function studies: No results for input(s): TSH, T4TOTAL, T3FREE, THYROIDAB in the last 72 hours.  Invalid input(s): FREET3 Anemia work up: No results for input(s): VITAMINB12, FOLATE, FERRITIN, TIBC, IRON, RETICCTPCT in the last 72 hours.  Sepsis Labs:  Recent Labs  Lab 01/11/24 1054 01/11/24 1342 01/11/24 2109 01/11/24 2232 01/12/24 0332 01/12/24 0554 01/13/24 0618 01/14/24 0509  PROCALCITON  --   --  0.36  --   --   --   --   --   WBC 21.0*  --   --   --   --  21.0* 17.9* 14.1*  LATICACIDVEN 5.1*   < > 3.0* 3.6* 2.2* 3.0*  --   --    < > = values in this interval not displayed.   Microbiology Recent Results (from the past 240 hours)  Resp panel by RT-PCR (RSV, Flu A&B, Covid) Anterior Nasal Swab     Status: None   Collection Time: 01/11/24 10:14 AM   Specimen: Anterior Nasal Swab  Result Value Ref Range Status   SARS Coronavirus 2 by RT PCR NEGATIVE NEGATIVE Final    Comment: (NOTE) SARS-CoV-2 target nucleic acids are NOT DETECTED.  The SARS-CoV-2 RNA is generally detectable in upper respiratory specimens during the acute phase of infection. The lowest concentration of SARS-CoV-2 viral copies this assay can detect is 138 copies/mL. A negative result does not preclude  SARS-Cov-2 infection and should not be used as the sole basis for treatment or other patient management decisions. A negative result may occur with  improper specimen collection/handling, submission of specimen other than nasopharyngeal swab, presence of viral mutation(s) within the areas targeted by this assay, and inadequate number of viral copies(<138 copies/mL). A negative result must be combined with clinical observations, patient history, and epidemiological information. The expected result is Negative.  Fact Sheet for Patients:  bloggercourse.com  Fact Sheet for Healthcare Providers:  seriousbroker.it  This test is no t yet approved or cleared by the [place]  FDA and  has been authorized for detection and/or diagnosis of SARS-CoV-2 by FDA under an Emergency Use Authorization (EUA). This EUA will remain  in effect (meaning this test can be used) for the duration of the COVID-19 declaration under Section 564(b)(1) of the Act, 21 U.S.C.section 360bbb-3(b)(1), unless the authorization is terminated  or revoked sooner.       Influenza A by PCR NEGATIVE NEGATIVE Final   Influenza B by PCR NEGATIVE NEGATIVE Final    Comment: (NOTE) The Xpert Xpress SARS-CoV-2/FLU/RSV plus assay is intended as an aid in the diagnosis of influenza from Nasopharyngeal swab specimens and should not be used as a sole basis for treatment. Nasal washings and aspirates are unacceptable for Xpert Xpress SARS-CoV-2/FLU/RSV testing.  Fact Sheet for Patients: bloggercourse.com  Fact Sheet for Healthcare Providers: seriousbroker.it  This test is not yet approved or cleared by the [place]  FDA and has been authorized for detection and/or diagnosis of SARS-CoV-2 by FDA under an Emergency Use Authorization (EUA). This EUA will remain in effect (meaning this test can be used) for the duration of  the COVID-19 declaration under Section 564(b)(1) of the Act, 21 U.S.C. section 360bbb-3(b)(1), unless the authorization is terminated or revoked.     Resp Syncytial Virus by PCR NEGATIVE NEGATIVE Final    Comment: (NOTE) Fact Sheet for Patients: bloggercourse.com  Fact Sheet for Healthcare Providers: seriousbroker.it  This test is not yet approved or cleared by the [place]  FDA and has been authorized for detection and/or diagnosis of SARS-CoV-2 by FDA under an Emergency Use Authorization (EUA). This EUA will remain in effect (meaning this test can be used) for the duration of the COVID-19 declaration under Section 564(b)(1) of the Act, 21 U.S.C. section 360bbb-3(b)(1), unless the  authorization is terminated or revoked.  Performed at [facility], [address] [place], [place] [address]   Culture, blood (routine x 2)     Status: None (Preliminary result)   Collection Time: 01/11/24 10:32 AM   Specimen: BLOOD  Result Value Ref Range Status   Specimen Description   Final    BLOOD LEFT ANTECUBITAL Performed at [facility], [address] [place], [place] [address]    Special Requests   Final    BOTTLES DRAWN AEROBIC AND ANAEROBIC Blood Culture adequate volume Performed at [facility], [address] [place], [place] [address]    Culture   Final    NO GROWTH 3 DAYS Performed at [facility], [address], [place], [place] [address]    Report Status PENDING  Incomplete  MRSA Next Gen by PCR, Nasal     Status: None   Collection Time: 01/11/24  7:01 PM   Specimen: Nasal Mucosa; Nasal Swab  Result Value Ref Range Status   MRSA by PCR Next Gen NOT DETECTED NOT DETECTED Final    Comment: (NOTE) The GeneXpert MRSA Assay (FDA approved for NASAL specimens only), is one component of a comprehensive MRSA colonization surveillance program. It is not intended to diagnose MRSA  infection nor to guide or monitor treatment for MRSA infections. Test performance is not FDA approved in patients less than 17 years old. Performed at [facility], [address], [place], [place] [address]   Culture, blood (routine x 2) Call MD if unable to obtain prior to antibiotics being given     Status: None (Preliminary result)   Collection Time: 01/11/24  9:09 PM   Specimen: BLOOD LEFT HAND  Result Value Ref Range Status   Specimen Description   Final    BLOOD LEFT HAND Performed at [facility], [address] [place], [place] [address]    Special Requests   Final    BOTTLES DRAWN AEROBIC ONLY Blood Culture results may not be optimal due to an inadequate volume of blood received in culture bottles Performed at [facility], [address], [place], [place] [address]    Culture   Final    NO GROWTH 3 DAYS Performed at [facility], [address] [place], [place] [address]    Report Status PENDING  Incomplete  Culture, blood (routine x 2) Call MD if unable to obtain prior to antibiotics being given     Status: None (Preliminary result)   Collection Time: 01/11/24  9:09 PM   Specimen: BLOOD LEFT HAND  Result Value Ref Range Status   Specimen Description   Final    BLOOD LEFT HAND Performed at [facility], [address] [place], [place] [address]    Special Requests   Final    BOTTLES DRAWN AEROBIC ONLY Blood Culture adequate volume Performed at [facility], [address], [place], [place] [address]    Culture   Final    NO GROWTH 3 DAYS Performed at [facility], [address] [place], [place] [address]    Report Status PENDING  Incomplete     Medications:    Chlorhexidine  Gluconate Cloth  6 each Topical Daily   cyanocobalamin   1,000 mcg Oral Daily   DULoxetine   120 mg Oral Daily   folic acid   1 mg Oral Daily   gabapentin   400  mg Oral TID   hydroxychloroquine   300 mg  Oral Daily   insulin  aspart  0-15 Units Subcutaneous Q4H   insulin  glargine-yfgn  7 Units Subcutaneous Daily   insulin  starter kit- pen needles  1 kit Other Once   living well with diabetes book   Does not apply Once   loratadine   10 mg Oral Daily   metoprolol  tartrate  25 mg Oral BID   montelukast   10 mg Oral QHS   pantoprazole   40 mg Oral Daily   polyethylene glycol  17 g Oral Daily   Continuous Infusions:  sodium chloride  10 mL/hr at 01/15/24 0610   aztreonam  Stopped (01/15/24 0549)   famotidine  (PEPCID ) IV Stopped (01/14/24 2245)   heparin  1,500 Units/hr (01/15/24 0610)   levofloxacin  (LEVAQUIN ) IV Stopped (01/14/24 1143)   promethazine  (PHENERGAN ) injection (IM or IVPB) Stopped (01/13/24 1231)      LOS: 4 days   Erle Odell Castor  [facility]  01/15/2024, 7:17 AM

## 2024-01-15 NOTE — Progress Notes (Signed)
 [facility] PROGRESS NOTE    Progress Note  Megan Weber  [name] DOB: 11-05-1975 DOA: 01/11/2024 PCP: Pia Kerney SQUIBB, MD   Brief Narrative:   Megan Weber is an 48 y.o. 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  In the ED was found to be septic with pneumonia and an acute PE.  Assessment/Plan:   Sepsis (HCC) due to multifocal pneumonia/with new pleural effusion on the left: Continue IV levofloxacin , she has defervesced leukocytosis improved. Tachycardia has resolved satting well on room air.   Tmax of 100.3. Will complete a 10-day course. He is complaining of left-sided pain CT scan of the abdomen pelvis showed no acute abdominal findings, large left pleural effusion with positive atelectasis of the left lobe. Consult IR for ultrasound-guided thoracocentesis sent for cell count protein and LDH.  New acute bilateral pulmonary embolism/she does have a history of PE: Likely due to noncompliance with her Xarelto . 2D echo showed an EF of 60% grade 1 diastolic dysfunction There is been a mild drop in her hemoglobin, CBC is pending no signs of overt bleeding recheck CBC today. She had a bowel movement yesterday she relates no bloody or melanotic stools. Check lower extremity Doppler, CT scan showed possible left common femoral vein thrombosis.  Diabetes mellitus type 2 uncontrolled with hyperglycemia/DKA: On admission blood glucose of 500 bicarbonate of 16 and gap of 20. She does not use a insulin  at home she is just on metformin. A1c of 10.0. Blood glucose well-controlled this morning, she is currently n.p.o.  for pancreatitis.  Acute Idiopathic acute pancreatitis without infection or necrosis Lipase was elevated, abdominal ultrasound showed no cholelithiasis or biliary ductal dilation such as diffuse hepatic steatosis She continues to have abdominal pain, she tried to eat yesterday and her pain got worse so she was placed back n.p.o. and continued IV narcotics. Continue on normal saline, monitor strict I's and O's. Will check CT scan of the abdomen pelvis Etiology remains unclear.  Triglycerides were less than 500. Noted that Abilify can cause pancreatitis and currently holding. Need to follow-up with GI as an outpatient.  Mild acute kidney injury: Now resolved.  Hypokalemia/hypomagnesemia:  Try to keep potassium greater than 4 magnesium  greater than 2. Renal panel this morning.  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 Gets Xenopozyme infusions every month.   Systemic mastocytosis: Followed by [facility] at home on immunotherapy these were held due to multifocal pneumonia. Can be resumed as an outpatient.  Essential hypertension: The pressure was low overnight received a bolus with albumin  continue metoprolol . Hold lisinopril .  History of cerebral aneurysm: No findings.  Morbid obesity: Noted she has been counseled.  DVT prophylaxis: lovenox  Family Communication:none Status is: Inpatient Remains inpatient appropriate because: Abscess due to multifocal pneumonia    Code Status:     Code Status Orders  (From admission, onward)           Start     Ordered   01/11/24 2024  Full code  Continuous       Question:  By:  Answer:  Default: patient does not have capacity for decision making, no surrogate or prior directive available   01/11/24 2026  Code Status History     Date Active Date Inactive Code Status Order ID Comments User Context   04/10/2017 0432 04/10/2017 1610 Full Code [id number]  Franky Redia SAILOR, MD Inpatient         IV  Access:   Peripheral IV   Procedures and diagnostic studies:   CT ABDOMEN PELVIS WO CONTRAST Result Date: 01/15/2024 EXAM: CT ABDOMEN AND PELVIS WITHOUT CONTRAST 01/15/2024 08:41:00 AM TECHNIQUE: CT of the abdomen and pelvis was performed without the administration of intravenous contrast. Multiplanar reformatted images are provided for review. Automated exposure control, iterative reconstruction, and/or weight-based adjustment of the mA/kV was utilized to reduce the radiation dose to as low as reasonably achievable. COMPARISON: None available. CLINICAL HISTORY: Abdominal pain, acute, nonlocalized. FINDINGS: LOWER CHEST: Large layering left pleural effusion with passive atelectasis of the left lower lobe. Mild scarring in the medial right lung base. LIVER: Low attenuation in the liver consistent with hepatic steatosis. GALLBLADDER AND BILE DUCTS: Sludge within the gallbladder. No gallbladder inflammation. No ductal dilatation. SPLEEN: Post splenectomy. PANCREAS: No acute abnormality. ADRENAL GLANDS: No acute abnormality. KIDNEYS, URETERS AND BLADDER: No stones in the kidneys or ureters. No hydronephrosis. No perinephric or periureteral stranding. Urinary bladder is unremarkable. GI AND BOWEL: Stomach demonstrates no acute abnormality. Normal appendix. No bowel inflammation or obstruction. PERITONEUM AND RETROPERITONEUM: No ascites. No free air. VASCULATURE: Aorta is normal in caliber. Arteries are normal. LYMPH NODES: No lymphadenopathy. REPRODUCTIVE ORGANS: Uterus is normal. BONES AND SOFT TISSUES: No acute osseous abnormality. No focal soft tissue abnormality. IMPRESSION: 1. No acute findings in the abdomen and pelvis. 2. Hepatic steatosis. 3. Large left pleural effusion with passive atelectasis of the left lower lobe. Electronically signed by: Norleen Boxer MD 01/15/2024 10:45 AM EST RP Workstation: [id number]   ECHOCARDIOGRAM COMPLETE Result Date: 01/13/2024    ECHOCARDIOGRAM REPORT   Patient Name:    Megan Weber Date of Exam: 01/13/2024 Medical Rec #:  [id number]       Height:       61.0 in Accession #:    [id number]      Weight:       166.0 lb Date of Birth:  February 20, 1975       BSA:          1.745 m Patient Age:    48 years        BP:           166/99 mmHg Patient Gender: F               HR:           92 bpm. Exam Location:  Inpatient Procedure: 2D Echo, Cardiac Doppler and Color Doppler (Both Spectral and Color            Flow Doppler were utilized during procedure). Indications:    Pulmonary Embolus  History:        Patient has no prior history of Echocardiogram examinations.                 Risk Factors:Hypertension and Diabetes.  Sonographer:    Philomena Daring Referring Phys: [id number] PAULA SOUTHERLY IMPRESSIONS  1. Left ventricular ejection fraction, by estimation, is 60 to 65%. The left ventricle has normal function. The left ventricle has no regional wall motion abnormalities. There is mild left ventricular hypertrophy. Left ventricular diastolic parameters are consistent with Grade I diastolic dysfunction (impaired relaxation).  2. Right ventricular systolic function is low normal. The right ventricular size is normal. Tricuspid  regurgitation signal is inadequate for assessing PA pressure.  3. The mitral valve is grossly normal. No evidence of mitral valve regurgitation.  4. The aortic valve is tricuspid. Aortic valve regurgitation is not visualized. No aortic stenosis is present.  5. The inferior vena cava is normal in size with <50% respiratory variability, suggesting right atrial pressure of 8 mmHg. Comparison(s): No prior Echocardiogram. No right heart strain. FINDINGS  Left Ventricle: Left ventricular ejection fraction, by estimation, is 60 to 65%. The left ventricle has normal function. The left ventricle has no regional wall motion abnormalities. The left ventricular internal cavity size was normal in size. There is  mild left ventricular hypertrophy. Left ventricular diastolic parameters are  consistent with Grade I diastolic dysfunction (impaired relaxation). Indeterminate filling pressures. Right Ventricle: The right ventricular size is normal. No increase in right ventricular wall thickness. Right ventricular systolic function is low normal. Tricuspid regurgitation signal is inadequate for assessing PA pressure. Left [facility]: Left atrial size was normal in size. Right [facility]: Right atrial size was normal in size. Pericardium: There is no evidence of pericardial effusion. Mitral Valve: The mitral valve is grossly normal. No evidence of mitral valve regurgitation. Tricuspid Valve: The tricuspid valve is normal in structure. Tricuspid valve regurgitation is not demonstrated. Aortic Valve: The aortic valve is tricuspid. Aortic valve regurgitation is not visualized. No aortic stenosis is present. Pulmonic Valve: The pulmonic valve was normal in structure. Pulmonic valve regurgitation is not visualized. Aorta: The aortic root and ascending aorta are structurally normal, with no evidence of dilitation. Venous: The inferior vena cava is normal in size with less than 50% respiratory variability, suggesting right atrial pressure of 8 mmHg. IAS/Shunts: No atrial level shunt detected by color flow Doppler.  LEFT VENTRICLE PLAX 2D LVIDd:         3.30 cm   Diastology LVIDs:         2.40 cm   LV e' medial:    8.38 cm/s LV PW:         1.10 cm   LV E/e' medial:  10.1 LV IVS:        1.20 cm   LV e' lateral:   9.79 cm/s LVOT diam:     2.10 cm   LV E/e' lateral: 8.7 LV SV:         48 LV SV Index:   28 LVOT Area:     3.46 cm LV IVRT:       58 msec  RIGHT VENTRICLE             IVC RV Basal diam:  2.60 cm     IVC diam: 1.50 cm RV Mid diam:    2.30 cm RV S prime:     10.10 cm/s TAPSE (M-mode): 1.8 cm LEFT ATRIUM             Index        RIGHT ATRIUM           Index LA diam:        2.30 cm 1.32 cm/m   RA Area:     12.50 cm LA Vol (A2C):   31.4 ml 17.99 ml/m  RA Volume:   26.00 ml  14.90 ml/m LA Vol (A4C):   29.5 ml 16.91  ml/m LA Biplane Vol: 30.9 ml 17.71 ml/m  AORTIC VALVE LVOT Vmax:   100.00 cm/s LVOT Vmean:  67.300 cm/s LVOT VTI:    0.140 m  AORTA Ao Root diam: 2.90 cm Ao  Asc diam:  2.70 cm MITRAL VALVE MV Area (PHT): 7.44 cm     SHUNTS MV Decel Time: 102 msec     Systemic VTI:  0.14 m MV E velocity: 84.80 cm/s   Systemic Diam: 2.10 cm MV A velocity: 100.00 cm/s MV E/A ratio:  0.85 Vinie Maxcy MD Electronically signed by Vinie Maxcy MD Signature Date/Time: 01/13/2024/3:55:04 PM    Final      Medical Consultants:   None.   Subjective:    Megan Weber relieved that every time she tries to eat she start having worsening abdominal pain.  Objective:    Vitals:   01/15/24 0700 01/15/24 0800 01/15/24 0900 01/15/24 1029  BP: (!) 146/71  124/74 139/66  Pulse: 95  (!) 101 (!) 115  Resp: (!) 31  (!) 25   Temp:  98.2 F (36.8 C)    TempSrc:  Oral    SpO2: 99%  97%   Weight:      Height:       SpO2: 97 % O2 Flow Rate (L/min): 2 L/min   Intake/Output Summary (Last 24 hours) at 01/15/2024 1048 Last data filed at 01/15/2024 0700 Gross per 24 hour  Intake 1561.22 ml  Output 2600 ml  Net -1038.78 ml   Filed Weights   01/11/24 1008  Weight: 75.3 kg    Exam: General exam: In no acute distress. Respiratory system: Good air movement and clear to auscultation. Cardiovascular system: S1 & S2 heard, RRR. No JVD. Gastrointestinal system: Positive bowel sound soft nontender nondistended Extremities: No pedal edema. Skin: No rashes, lesions or ulcers Psychiatry: Judgement and insight appear normal. Mood & affect appropriate. Data Reviewed:    Labs: Basic Metabolic Panel: Recent Labs  Lab 01/11/24 2109 01/12/24 0554 01/13/24 0618 01/14/24 0509 01/15/24 0743  NA 135 139 138 140 140  K 3.4* 3.4* 3.2* 3.9 3.4*  CL 103 107 103 112* 107  CO2 16* 21* 24 21* 22  GLUCOSE 292* 203* 252* 103* 124*  BUN 9 5* 7 7 <5*  CREATININE 0.89 0.82 0.67 0.73 0.66  CALCIUM  8.8* 8.6* 8.5* 7.7* 8.6*   MG 1.7  --  1.6* 1.6*  --   PHOS 1.9*  --  3.9  --  3.4   GFR Estimated Creatinine Clearance: 81.6 mL/min (by C-G formula based on SCr of 0.66 mg/dL). Liver Function Tests: Recent Labs  Lab 01/11/24 1054 01/11/24 2109 01/12/24 0554 01/13/24 0618 01/15/24 0743  AST 36 34 29 17  --   ALT 18 16 13 13   --   ALKPHOS 157* 155* 140* 147*  --   BILITOT 0.7 0.8 0.7 0.5  --   PROT 7.2 6.5 5.7* 5.8*  --   ALBUMIN  3.7 3.3* 2.9* 2.9* 2.9*   Recent Labs  Lab 01/11/24 1054 01/13/24 0618 01/14/24 0509  LIPASE 151* 191* 132*   No results for input(s): AMMONIA in the last 168 hours. Coagulation profile Recent Labs  Lab 01/11/24 1054  INR 2.1*   COVID-19 Labs  No results for input(s): DDIMER, FERRITIN, LDH, CRP in the last 72 hours.   Lab Results  Component Value Date   SARSCOV2NAA NEGATIVE 01/11/2024    CBC: Recent Labs  Lab 01/11/24 1054 01/11/24 1221 01/12/24 0554 01/13/24 0618 01/14/24 0509 01/15/24 0743  WBC 21.0*  --  21.0* 17.9* 14.1* 18.6*  NEUTROABS 16.7*  --   --   --   --   --   HGB 12.8 12.9 10.8* 11.1* 8.9* 10.2*  HCT 37.3 38.0 32.0* 33.3* 27.1* 30.4*  MCV 85.9  --  87.0 86.7 90.6 89.1  PLT 325  325  --  331 407* 363 489*   Cardiac Enzymes: No results for input(s): CKTOTAL, CKMB, CKMBINDEX, TROPONINI in the last 168 hours. BNP (last 3 results) No results for input(s): PROBNP in the last 8760 hours. CBG: Recent Labs  Lab 01/14/24 1557 01/14/24 2036 01/14/24 2354 01/15/24 0357 01/15/24 0745  GLUCAP 114* 115* 132* 133* 94   D-Dimer: No results for input(s): DDIMER in the last 72 hours.  Hgb A1c: No results for input(s): HGBA1C in the last 72 hours.  Lipid Profile: Recent Labs    01/12/24 1302  CHOL 140  HDL 30*  LDLCALC 78  TRIG 841*  CHOLHDL 4.6   Thyroid function studies: No results for input(s): TSH, T4TOTAL, T3FREE, THYROIDAB in the last 72 hours.  Invalid input(s): FREET3 Anemia work up: No  results for input(s): VITAMINB12, FOLATE, FERRITIN, TIBC, IRON, RETICCTPCT in the last 72 hours.  Sepsis Labs: Recent Labs  Lab 01/11/24 2109 01/11/24 2232 01/12/24 0332 01/12/24 0554 01/13/24 0618 01/14/24 0509 01/15/24 0743  PROCALCITON 0.36  --   --   --   --   --   --   WBC  --   --   --  21.0* 17.9* 14.1* 18.6*  LATICACIDVEN 3.0* 3.6* 2.2* 3.0*  --   --   --    Microbiology Recent Results (from the past 240 hours)  Resp panel by RT-PCR (RSV, Flu A&B, Covid) Anterior Nasal Swab     Status: None   Collection Time: 01/11/24 10:14 AM   Specimen: Anterior Nasal Swab  Result Value Ref Range Status   SARS Coronavirus 2 by RT PCR NEGATIVE NEGATIVE Final    Comment: (NOTE) SARS-CoV-2 target nucleic acids are NOT DETECTED.  The SARS-CoV-2 RNA is generally detectable in upper respiratory specimens during the acute phase of infection. The lowest concentration of SARS-CoV-2 viral copies this assay can detect is 138 copies/mL. A negative result does not preclude SARS-Cov-2 infection and should not be used as the sole basis for treatment or other patient management decisions. A negative result may occur with  improper specimen collection/handling, submission of specimen other than nasopharyngeal swab, presence of viral mutation(s) within the areas targeted by this assay, and inadequate number of viral copies(<138 copies/mL). A negative result must be combined with clinical observations, patient history, and epidemiological information. The expected result is Negative.  Fact Sheet for Patients:  bloggercourse.com  Fact Sheet for Healthcare Providers:  seriousbroker.it  This test is no t yet approved or cleared by the [place]  FDA and  has been authorized for detection and/or diagnosis of SARS-CoV-2 by FDA under an Emergency Use Authorization (EUA). This EUA will remain  in effect (meaning this test can be used) for  the duration of the COVID-19 declaration under Section 564(b)(1) of the Act, 21 U.S.C.section 360bbb-3(b)(1), unless the authorization is terminated  or revoked sooner.       Influenza A by PCR NEGATIVE NEGATIVE Final   Influenza B by PCR NEGATIVE NEGATIVE Final    Comment: (NOTE) The Xpert Xpress SARS-CoV-2/FLU/RSV plus assay is intended as an aid in the diagnosis of influenza from Nasopharyngeal swab specimens and should not be used as a sole basis for treatment. Nasal washings and aspirates are unacceptable for Xpert Xpress SARS-CoV-2/FLU/RSV testing.  Fact Sheet for Patients: bloggercourse.com  Fact Sheet for Healthcare Providers: seriousbroker.it  This test is not  yet approved or cleared by the [place]  FDA and has been authorized for detection and/or diagnosis of SARS-CoV-2 by FDA under an Emergency Use Authorization (EUA). This EUA will remain in effect (meaning this test can be used) for the duration of the COVID-19 declaration under Section 564(b)(1) of the Act, 21 U.S.C. section 360bbb-3(b)(1), unless the authorization is terminated or revoked.     Resp Syncytial Virus by PCR NEGATIVE NEGATIVE Final    Comment: (NOTE) Fact Sheet for Patients: bloggercourse.com  Fact Sheet for Healthcare Providers: seriousbroker.it  This test is not yet approved or cleared by the [place]  FDA and has been authorized for detection and/or diagnosis of SARS-CoV-2 by FDA under an Emergency Use Authorization (EUA). This EUA will remain in effect (meaning this test can be used) for the duration of the COVID-19 declaration under Section 564(b)(1) of the Act, 21 U.S.C. section 360bbb-3(b)(1), unless the authorization is terminated or revoked.  Performed at [facility], [address] [place], [place] [address]   Culture, blood (routine x 2)     Status: None  (Preliminary result)   Collection Time: 01/11/24 10:32 AM   Specimen: BLOOD  Result Value Ref Range Status   Specimen Description   Final    BLOOD LEFT ANTECUBITAL Performed at [facility], [address] [place], [place] [address]    Special Requests   Final    BOTTLES DRAWN AEROBIC AND ANAEROBIC Blood Culture adequate volume Performed at [facility], [address] [place], [place] [address]    Culture   Final    NO GROWTH 3 DAYS Performed at [facility], [address] [place], [place] [address]    Report Status PENDING  Incomplete  MRSA Next Gen by PCR, Nasal     Status: None   Collection Time: 01/11/24  7:01 PM   Specimen: Nasal Mucosa; Nasal Swab  Result Value Ref Range Status   MRSA by PCR Next Gen NOT DETECTED NOT DETECTED Final    Comment: (NOTE) The GeneXpert MRSA Assay (FDA approved for NASAL specimens only), is one component of a comprehensive MRSA colonization surveillance program. It is not intended to diagnose MRSA infection nor to guide or monitor treatment for MRSA infections. Test performance is not FDA approved in patients less than 32 years old. Performed at [facility], [address], [place], [place] [address]   Culture, blood (routine x 2) Call MD if unable to obtain prior to antibiotics being given     Status: None (Preliminary result)   Collection Time: 01/11/24  9:09 PM   Specimen: BLOOD LEFT HAND  Result Value Ref Range Status   Specimen Description   Final    BLOOD LEFT HAND Performed at [facility], [address] [place], [place] [address]    Special Requests   Final    BOTTLES DRAWN AEROBIC ONLY Blood Culture results may not be optimal due to an inadequate volume of blood received in culture bottles Performed at [facility], [address], [place], [place] [address]    Culture   Final    NO GROWTH 3 DAYS Performed at [facility], [address]  [place], [place] [address]    Report Status PENDING  Incomplete  Culture, blood (routine x 2) Call MD if unable to obtain prior to antibiotics being given     Status: None (Preliminary result)   Collection  Time: 01/11/24  9:09 PM   Specimen: BLOOD LEFT HAND  Result Value Ref Range Status   Specimen Description   Final    BLOOD LEFT HAND Performed at [facility], [address] [place], [place] [address]    Special Requests   Final    BOTTLES DRAWN AEROBIC ONLY Blood Culture adequate volume Performed at [facility], [address], [place], [place] [address]    Culture   Final    NO GROWTH 3 DAYS Performed at [facility], [address], [place], [place] [address]    Report Status PENDING  Incomplete     Medications:    Chlorhexidine  Gluconate Cloth  6 each Topical Daily   cyanocobalamin   1,000 mcg Oral Daily   DULoxetine   120 mg Oral Daily   folic acid   1 mg Oral Daily   gabapentin   400 mg Oral TID   hydroxychloroquine   300 mg Oral Daily   insulin  aspart  0-15 Units Subcutaneous Q4H   insulin  glargine-yfgn  7 Units Subcutaneous Daily   insulin  starter kit- pen needles  1 kit Other Once   living well with diabetes book   Does not apply Once   loratadine   10 mg Oral Daily   metoprolol  tartrate  50 mg Oral BID   montelukast   10 mg Oral QHS   pantoprazole   40 mg Oral Daily   Continuous Infusions:  sodium chloride  10 mL/hr at 01/15/24 0610   sodium chloride  100 mL/hr at 01/15/24 0755   aztreonam  Stopped (01/15/24 0549)   famotidine  (PEPCID ) IV 20 mg (01/15/24 1036)   heparin  1,500 Units/hr (01/15/24 0610)   levofloxacin  (LEVAQUIN ) IV 750 mg (01/15/24 1041)   promethazine  (PHENERGAN ) injection (IM or IVPB) Stopped (01/13/24 1231)      LOS: 4 days   Erle Odell Castor  [facility]  01/15/2024, 10:48 AM

## 2024-01-15 NOTE — Procedures (Signed)
 Ultrasound-guided diagnostic and therapeutic left thoracentesis performed yielding 360 cc of turbid, light yellow fluid. No immediate complications. Follow-up chest x-ray pending. EBL none. The fluid was sent to the lab for preordered studies.

## 2024-01-15 NOTE — Progress Notes (Signed)
 BLE venous duplex has been completed.   Results can be found under chart review under CV PROC. 01/15/2024 3:03 PM Georgean Spainhower RVT, RDMS

## 2024-01-15 NOTE — Progress Notes (Signed)
 PHARMACY - ANTICOAGULATION CONSULT NOTE  Pharmacy Consult for Heparin  Indication: pulmonary embolus  Allergies[1]  Patient Measurements: Height: 5' 1 (154.9 cm) Weight: 75.3 kg (166 lb) IBW/kg (Calculated) : 47.8 HEPARIN  DW (KG): 64.4  Vital Signs: Temp: 99.9 F (37.7 C) (12/16 0323) Temp Source: Axillary (12/16 0323) BP: 131/75 (12/16 0600) Pulse Rate: 95 (12/16 0600)  Labs: Recent Labs    01/12/24 1302 01/12/24 2320 01/13/24 0618 01/14/24 0509 01/14/24 1215 01/14/24 1835 01/15/24 0514  HGB  --   --  11.1* 8.9*  --   --   --   HCT  --   --  33.3* 27.1*  --   --   --   PLT  --   --  407* 363  --   --   --   APTT 56* 86* 82*  --   --   --   --   HEPARINUNFRC 0.49  --  0.42 0.25* 0.38 0.36 0.42  CREATININE  --   --  0.67 0.73  --   --   --     Estimated Creatinine Clearance: 81.6 mL/min (by C-G formula based on SCr of 0.73 mg/dL).  Medications: Infusions:   sodium chloride  10 mL/hr at 01/15/24 0610   aztreonam  Stopped (01/15/24 0549)   famotidine  (PEPCID ) IV Stopped (01/14/24 2245)   heparin  1,500 Units/hr (01/15/24 0610)   levofloxacin  (LEVAQUIN ) IV Stopped (01/14/24 1143)   promethazine  (PHENERGAN ) injection (IM or IVPB) Stopped (01/13/24 1231)     Assessment: 50 yoF presented to ED on 12/12 with CP and vomiting. Found to have bilateral PE's and possible DVT.  Pt is on chronic low dose Xarelto  10mg , but patient reports noncompliance.  Pharmacy is consulted to dose Heparin  IV.  Today, 01/15/2024:  Heparin  level 0.42, therapeutic on heparin  1500 units/hr.  CBC: Hgb low/improved to 10.2, Plt elevated No bleeding or complications reported  Goal of Therapy:  Heparin  level 0.3-0.7 units/ml Monitor platelets by anticoagulation protocol: Yes   Plan:  Con't IV heparin  1500 units/hr Daily heparin  level and CBC Follow up plan for transition to Xarelto  on 12/16.   Wanda Hasting PharmD, BCPS [facility] main pharmacy [phone] 01/15/2024 7:21 AM         [1]   Allergies Allergen Reactions   Ceftin [Cefuroxime Axetil] Shortness Of Breath and Swelling   Cinobac [Cinoxacin] Shortness Of Breath and Swelling    Has received Levaquin  in inpt & outpt setting   Fire Ant Anaphylaxis   Sulfa Antibiotics Other (See Comments)    Unknown

## 2024-01-15 NOTE — Progress Notes (Signed)
 Initial Nutrition Assessment  DOCUMENTATION CODES:   Obesity unspecified  INTERVENTION:  - NPO due to diet intolerance. Will monitor for diet advancement.  - If diet able to be advanced, would typically recommend nutrition supplements however patient is a vegetarian and does not consume milk so only option is plant-based [employer] which is too high in fat for pancreatitis.  - If unable to advance diet or patient not tolerating a diet over the next 2-3 days, would recommend post-pyloric Cortrak (past the ligament of Treitz) and starting tube feeds.    NUTRITION DIAGNOSIS:   Inadequate oral intake related to inability to eat, acute illness (acute pancreatitis) as evidenced by NPO status.  GOAL:   Patient will meet greater than or equal to 90% of their needs  MONITOR:   Diet advancement, Labs, Weight trends  REASON FOR ASSESSMENT:   Consult Diet education (diabetes)  ASSESSMENT:   48 y.o. female with PMH significant for GERD, iron deficiency anemia, hypothyroidism, major depression, essential hypertension, DM2 who presented for 3 days of nausea vomiting fever and chills and chest tightness with a cough. Admitted for sepsis due to pneumonia, an acute PE, and idiopathic acute pancreatitis.   12/12 Admit; CLD 12/13 FLD -> Soft diet 12/14 NPO 12/15 CLD -> NPO  Patient resting in bed at time of visit. She reports a UBW of 166# and feels her weight has been stable. Per EMR, weight stable over the past year.   Patient endorses typically eating 1 meal a day at home. She endorses being a vegetarian. Does not consume milk. Was eating normally up until N/V issues began 3 days PTA.   Patient has been on several different diets this admission. Per MD notes, the patient reports that every time she tries to eat something she has worse abdominal pain. She is now NPO.   Provided Pancreatitis Nutrition Therapy handout and reviewed. Discussed a low fat diet is recommended for pancreatitis  and discussed foods that are recommended and those that are not. Encouraged patient to review this for when diet is advanced again. She endorses understanding.  Also provided Carbohydrate Counting for Diabetes handout for patient to have as a reference per diabetes coordinator.   Patient appreciative of information. Abdominal pain currently ongoing but not worse today. She is agreeable to try ONS once diet advanced. As she does not consume milk, only appropriate supplement would be [employer]. However, as [employer] has 19g of fat per bottle, this is not an appropriate supplement for pancreatitis. Would need to monitor intake closely.  If unable to advance patient's diet or she is not tolerating diet, would recommend post-pyloric Cortrak past the ligament of Treitz and tube feeds.    Medications reviewed and include: 1000mcg vitamin B12, 1mg  folic acid , Protonix , Phenergan  prn  Labs reviewed:  K+ 3.4 HA1C 10.6 Blood Glucose 94-202 x24 hours   NUTRITION - FOCUSED PHYSICAL EXAM:  Flowsheet Row Most Recent Value  Orbital Region No depletion  Upper Arm Region No depletion  Thoracic and Lumbar Region No depletion  Buccal Region No depletion  Temple Region No depletion  Clavicle Bone Region No depletion  Clavicle and Acromion Bone Region No depletion  Scapular Bone Region No depletion  Dorsal Hand No depletion  Patellar Region No depletion  Anterior Thigh Region No depletion  Posterior Calf Region No depletion  Edema (RD Assessment) None  Hair Reviewed  Eyes Reviewed  Mouth Reviewed  Skin Reviewed  Nails Reviewed    Diet Order:  Diet Order             Diet NPO time specified Except for: Sips with Meds  Diet effective now                   EDUCATION NEEDS:  Education needs have been addressed  Skin:  Skin Assessment: Reviewed RN Assessment  Last BM:  12/13 - type 5  Height:  Ht Readings from Last 1 Encounters:  01/11/24 5' 1 (1.549 m)   Weight:  Wt  Readings from Last 1 Encounters:  01/11/24 75.3 kg   Ideal Body Weight:  47.73 kg  BMI:  Body mass index is 31.37 kg/m.  Estimated Nutritional Needs:  Kcal:  1900-2100 kcals Protein:  90-105 grams Fluid:  >/= 1.9L    Trude Ned RD, LDN Contact via Secure Chat.

## 2024-01-16 DIAGNOSIS — J90 Pleural effusion, not elsewhere classified: Secondary | ICD-10-CM

## 2024-01-16 DIAGNOSIS — E1110 Type 2 diabetes mellitus with ketoacidosis without coma: Secondary | ICD-10-CM | POA: Diagnosis not present

## 2024-01-16 DIAGNOSIS — J181 Lobar pneumonia, unspecified organism: Secondary | ICD-10-CM | POA: Diagnosis not present

## 2024-01-16 DIAGNOSIS — R6520 Severe sepsis without septic shock: Secondary | ICD-10-CM | POA: Diagnosis not present

## 2024-01-16 DIAGNOSIS — K859 Acute pancreatitis without necrosis or infection, unspecified: Secondary | ICD-10-CM | POA: Diagnosis not present

## 2024-01-16 DIAGNOSIS — I2699 Other pulmonary embolism without acute cor pulmonale: Secondary | ICD-10-CM | POA: Diagnosis not present

## 2024-01-16 DIAGNOSIS — A419 Sepsis, unspecified organism: Secondary | ICD-10-CM | POA: Diagnosis not present

## 2024-01-16 DIAGNOSIS — N179 Acute kidney failure, unspecified: Secondary | ICD-10-CM | POA: Diagnosis not present

## 2024-01-16 LAB — CULTURE, BLOOD (ROUTINE X 2)
Culture: NO GROWTH
Culture: NO GROWTH
Culture: NO GROWTH
Special Requests: ADEQUATE
Special Requests: ADEQUATE

## 2024-01-16 LAB — GLUCOSE, CAPILLARY
Glucose-Capillary: 88 mg/dL (ref 70–99)
Glucose-Capillary: 94 mg/dL (ref 70–99)
Glucose-Capillary: 153 mg/dL — ABNORMAL HIGH (ref 70–99)
Glucose-Capillary: 135 mg/dL — ABNORMAL HIGH (ref 70–99)
Glucose-Capillary: 254 mg/dL — ABNORMAL HIGH (ref 70–99)
Glucose-Capillary: 90 mg/dL (ref 70–99)

## 2024-01-16 LAB — RENAL FUNCTION PANEL
Albumin: 2.9 g/dL — ABNORMAL LOW (ref 3.5–5.0)
Anion gap: 11 (ref 5–15)
BUN: <5 mg/dL — ABNORMAL LOW (ref 6–20)
CO2: 22 mmol/L (ref 22–32)
Calcium: 8.6 mg/dL — ABNORMAL LOW (ref 8.9–10.3)
Chloride: 107 mmol/L (ref 98–111)
Creatinine, Ser: 0.61 mg/dL (ref 0.44–1.00)
GFR, Estimated: >60 mL/min (ref >60)
Glucose, Bld: 107 mg/dL — ABNORMAL HIGH (ref 70–99)
Phosphorus: 3 mg/dL (ref 2.5–4.6)
Potassium: 3.8 mmol/L (ref 3.5–5.1)
Sodium: 139 mmol/L (ref 135–145)

## 2024-01-16 LAB — TRIGLYCERIDES, BODY FLUIDS: Triglycerides, Fluid: 40 mg/dL

## 2024-01-16 LAB — CBC
HCT: 31.3 % — ABNORMAL LOW (ref 36.0–46.0)
Hemoglobin: 10.3 g/dL — ABNORMAL LOW (ref 12.0–15.0)
MCH: 29.9 pg (ref 26.0–34.0)
MCHC: 32.9 g/dL (ref 30.0–36.0)
MCV: 90.7 fL (ref 80.0–100.0)
Platelets: 535 K/uL — ABNORMAL HIGH (ref 150–400)
RBC: 3.45 MIL/uL — ABNORMAL LOW (ref 3.87–5.11)
RDW: 14.7 % (ref 11.5–15.5)
WBC: 16.8 K/uL — ABNORMAL HIGH (ref 4.0–10.5)
nRBC: 0 % (ref 0.0–0.2)

## 2024-01-16 LAB — HEPARIN LEVEL (UNFRACTIONATED): Heparin Unfractionated: 0.38 [IU]/mL (ref 0.30–0.70)

## 2024-01-16 LAB — PATHOLOGIST SMEAR REVIEW

## 2024-01-16 MED ORDER — HYDROMORPHONE HCL 1 MG/ML IJ SOLN
0.5000 mg | INTRAMUSCULAR | Status: DC | PRN
  Administered 2024-01-16 – 2024-01-17 (×4): 0.5 mg via INTRAVENOUS
  Filled 2024-01-16 (×2): qty 0.5
  Filled 2024-01-16 (×2): qty 1

## 2024-01-16 NOTE — Progress Notes (Addendum)
 [facility] PROGRESS NOTE    Progress Note  Megan Weber  [name] DOB: 12/25/1975 DOA: 01/11/2024 PCP: Pia Kerney SQUIBB, MD   Brief Narrative:   Megan Weber is an 48 y.o. 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  In the ED was found to be septic with pneumonia and an acute PE.  Assessment/Plan:   Sepsis (HCC) due to multifocal pneumonia/with new pleural effusion on the left: T.max of 98.4. Continue IV Unasyn  and doxycycline  for 10 days. Status post thoracocentesis on 01/15/2024 it was done as she was complaining of left-sided flank pain, about 300 cc of turbid yellow fluid were drained.  Cytology showed 17,000 white blood cells 88 predominantly neutrophils.  Cultures are pending.  New acute bilateral pulmonary embolism/she does have a history of PE: Likely due to noncompliance with her Xarelto . 2D echo showed an EF of 60% grade 1 diastolic dysfunction Hemoglobin has stabilized this morning is 10.3. She had a bowel movement yesterday she relates no bloody or melanotic stools. Lower extremity Doppler was negative for DVT.  Diabetes mellitus type 2 uncontrolled with hyperglycemia/DKA: Conservatively treated with IV fluids and IV insulin , DKA has resolved. She does not use a insulin  at home, she is just on metformin. A1c of 10.0.  Might need home insulin . Allow clear liquid diet. Continue long-acting insulin  per sliding scale.  Acute Idiopathic acute pancreatitis without infection or necrosis She relates her abdominal pain is improved, she would like to try a clear liquid diet.   Decrease  narcotics. Monitor strict I's and O's.  Her urine output has picked up. CT scan of the abdomen pelvis showed no acute findings with large pleural effusion see above for the details. Etiology remains unclear.   Noted that Abilify can cause pancreatitis and currently holding. Need to follow-up with GI as an outpatient.  Mild acute kidney injury: Now resolved.  Hypokalemia/hypomagnesemia:  Try to keep potassium greater than 4 magnesium  greater than 2. Potassium was low and was repleted recheck in the morning.  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 Gets Xenopozyme infusions every month.   Systemic mastocytosis: Followed by [facility] at home on immunotherapy these were held due to multifocal pneumonia. Can be resumed as an outpatient.  Chronic HFpEF: noted  Essential hypertension: Continue to hold antihypertensive medication except for metoprolol . Blood pressure is improved.  History of cerebral aneurysm: Noted  Morbid obesity: Noted she has been counseled.  DVT prophylaxis: lovenox  Family Communication:none Status is: Inpatient Remains inpatient appropriate because: Abscess due to multifocal pneumonia    Code Status:     Code Status Orders  (From admission, onward)           Start     Ordered   01/11/24 2024  Full code  Continuous       Question:  By:  Answer:  Default: patient does not have capacity for decision making, no surrogate or prior directive available   01/11/24 2026           Code Status History     Date Active Date Inactive Code Status Order ID Comments User Context   04/10/2017 0432 04/10/2017 1610 Full Code [id number]  Franky Redia SAILOR, MD Inpatient  IV Access:   Peripheral IV   Procedures and diagnostic studies:   DG Chest Port 1 View Result Date: 01/15/2024 EXAM: 1 VIEW(S) XRAY OF THE CHEST 01/15/2024 04:26:00 PM COMPARISON: Comparison with 07/31/2023. CLINICAL HISTORY: [id number] S/P thoracentesis [id number]  S/P thoracentesis FINDINGS: LINES, TUBES AND DEVICES: There is a power port type central venous catheter with the tip over the low SVC region. LUNGS AND PLEURA: Shallow inspiration. Small left pleural effusion with basilar atelectasis or infiltration. No pneumothorax. The right lung is clear. HEART AND MEDIASTINUM: Heart size and pulmonary vascularity are normal. BONES AND SOFT TISSUES: Surgical clips in the left upper quadrant. No acute osseous abnormality. IMPRESSION: 1. No pneumothorax. 2. Small left pleural effusion with basilar atelectasis or infiltration. Electronically signed by: Elsie Gravely MD 01/15/2024 04:36 PM EST RP Workstation: [id number]   IR THORACENTESIS ASP PLEURAL SPACE W/IMG GUIDE Result Date: 01/15/2024 INDICATION: Patient with history of asthma, Pickwick disease, DVT on Xarelto , systemic mastocytosis, pneumonia, recent pulmonary emboli, left pleural effusion; request received for diagnostic and therapeutic left thoracentesis. EXAM: ULTRASOUND GUIDED DIAGNOSTIC AND THERAPEUTIC LEFT THORACENTESIS MEDICATIONS: 8 mL 1% lidocaine  with epinephrine  to skin/subcutaneous tissue COMPLICATIONS: None immediate. PROCEDURE: An ultrasound guided thoracentesis was thoroughly discussed with the patient and questions answered. The benefits, risks, alternatives and complications were also discussed. The patient understands and wishes to proceed with the procedure. Written consent was obtained. Ultrasound was performed to localize and mark an adequate pocket of fluid in the left chest. The area was then prepped and draped in the normal sterile fashion. 1% Lidocaine  was used for local anesthesia. Under ultrasound guidance a 6 Fr Safe-T-Centesis catheter was introduced. Thoracentesis was performed. The catheter was removed and a dressing applied. FINDINGS: A total of approximately 360 cc of turbid, light yellow fluid was removed. Samples were sent to the laboratory as requested by the clinical team.  IMPRESSION: Successful ultrasound guided diagnostic and therapeutic left thoracentesis yielding 360 cc of pleural fluid. Performed by: Franky Rakers, PA-C Electronically Signed   By: Ester Sides M.D.   On: 01/15/2024 16:15   VAS US  LOWER EXTREMITY VENOUS (DVT) Result Date: 01/15/2024  Lower Venous DVT Study Patient Name:  Megan Weber  Date of Exam:   01/15/2024 Medical Rec #: [id number]        Accession #:    [id number] Date of Birth: 1975-06-25        Patient Gender: F Patient Age:   37 years Exam Location:  [facility] Procedure:      VAS US  LOWER EXTREMITY VENOUS (DVT) Referring Phys: ERLE CLUNES ORTIZ --------------------------------------------------------------------------------  Indications: Pulmonary embolism.  Risk Factors: Hx of PE/DVT. Anticoagulation: Xarelto  - non compliant. Limitations: Poor ultrasound/tissue interface. Comparison Study: Previous LLEV on 07/25/2021 & RLEV on 05/31/2021 were both                   negative for DVT Performing Technologist: Ezzie Potters RVT, RDMS  Examination Guidelines: A complete evaluation includes B-mode imaging, spectral Doppler, color Doppler, and power Doppler as needed of all accessible portions of each vessel. Bilateral testing is considered an integral part of a complete examination. Limited examinations for reoccurring indications may be performed as noted. The reflux portion of the exam is performed with the patient in reverse Trendelenburg.  +---------+---------------+---------+-----------+----------+--------------+ RIGHT    CompressibilityPhasicitySpontaneityPropertiesThrombus Aging +---------+---------------+---------+-----------+----------+--------------+ CFV      Full           Yes      Yes                                 +---------+---------------+---------+-----------+----------+--------------+  SFJ      Full                                                         +---------+---------------+---------+-----------+----------+--------------+ FV Prox  Full           Yes      Yes                                 +---------+---------------+---------+-----------+----------+--------------+ FV Mid   Full           Yes      Yes                                 +---------+---------------+---------+-----------+----------+--------------+ FV DistalFull           Yes      Yes                                 +---------+---------------+---------+-----------+----------+--------------+ PFV      Full                                                        +---------+---------------+---------+-----------+----------+--------------+ POP      Full           Yes      Yes                                 +---------+---------------+---------+-----------+----------+--------------+ PTV      Full                                                        +---------+---------------+---------+-----------+----------+--------------+ PERO     Full                                                        +---------+---------------+---------+-----------+----------+--------------+   +---------+---------------+---------+-----------+----------+--------------+ LEFT     CompressibilityPhasicitySpontaneityPropertiesThrombus Aging +---------+---------------+---------+-----------+----------+--------------+ CFV      Full           Yes      Yes                                 +---------+---------------+---------+-----------+----------+--------------+ SFJ      Full                                                        +---------+---------------+---------+-----------+----------+--------------+  FV Prox  Full           Yes      Yes                                 +---------+---------------+---------+-----------+----------+--------------+ FV Mid   Full           Yes      Yes                                  +---------+---------------+---------+-----------+----------+--------------+ FV DistalFull           Yes      Yes                                 +---------+---------------+---------+-----------+----------+--------------+ PFV      Full                                                        +---------+---------------+---------+-----------+----------+--------------+ POP      Full           Yes      Yes                                 +---------+---------------+---------+-----------+----------+--------------+ PTV      Full                                                        +---------+---------------+---------+-----------+----------+--------------+ PERO     Full                                                        +---------+---------------+---------+-----------+----------+--------------+    Summary: BILATERAL: - No evidence of deep vein thrombosis seen in the lower extremities, bilaterally. -No evidence of popliteal cyst, bilaterally.   *See table(s) above for measurements and observations.    Preliminary    CT ABDOMEN PELVIS WO CONTRAST Result Date: 01/15/2024 EXAM: CT ABDOMEN AND PELVIS WITHOUT CONTRAST 01/15/2024 08:41:00 AM TECHNIQUE: CT of the abdomen and pelvis was performed without the administration of intravenous contrast. Multiplanar reformatted images are provided for review. Automated exposure control, iterative reconstruction, and/or weight-based adjustment of the mA/kV was utilized to reduce the radiation dose to as low as reasonably achievable. COMPARISON: None available. CLINICAL HISTORY: Abdominal pain, acute, nonlocalized. FINDINGS: LOWER CHEST: Large layering left pleural effusion with passive atelectasis of the left lower lobe. Mild scarring in the medial right lung base. LIVER: Low attenuation in the liver consistent with hepatic steatosis. GALLBLADDER AND BILE DUCTS: Sludge within the gallbladder. No gallbladder inflammation. No ductal dilatation.  SPLEEN: Post splenectomy. PANCREAS: No acute abnormality. ADRENAL GLANDS: No acute abnormality. KIDNEYS, URETERS AND BLADDER: No stones in the kidneys or ureters. No hydronephrosis. No perinephric or periureteral stranding. Urinary  bladder is unremarkable. GI AND BOWEL: Stomach demonstrates no acute abnormality. Normal appendix. No bowel inflammation or obstruction. PERITONEUM AND RETROPERITONEUM: No ascites. No free air. VASCULATURE: Aorta is normal in caliber. Arteries are normal. LYMPH NODES: No lymphadenopathy. REPRODUCTIVE ORGANS: Uterus is normal. BONES AND SOFT TISSUES: No acute osseous abnormality. No focal soft tissue abnormality. IMPRESSION: 1. No acute findings in the abdomen and pelvis. 2. Hepatic steatosis. 3. Large left pleural effusion with passive atelectasis of the left lower lobe. Electronically signed by: Norleen Boxer MD 01/15/2024 10:45 AM EST RP Workstation: [id number]     Medical Consultants:   None.   Subjective:    Megan Weber she rates her abdominal pain is relief not resolved but is better than yesterday.  Objective:    Vitals:   01/16/24 0009 01/16/24 0121 01/16/24 0404 01/16/24 0634  BP: (!) 163/100 (!) 168/92 107/84 (!) 135/90  Pulse: (!) 103 (!) 109 (!) 109 (!) 102  Resp: 16 19 18 20   Temp: 98.2 F (36.8 C)  98.4 F (36.9 C)   TempSrc: Oral  Oral   SpO2: 96% 95% 95% 96%  Weight:      Height:       SpO2: 96 % O2 Flow Rate (L/min): 2 L/min   Intake/Output Summary (Last 24 hours) at 01/16/2024 0710 Last data filed at 01/16/2024 0659 Gross per 24 hour  Intake 3202.27 ml  Output 2070 ml  Net 1132.27 ml   Filed Weights   01/11/24 1008  Weight: 75.3 kg    Exam: General exam: In no acute distress. Respiratory system: Good air movement and clear to auscultation. Cardiovascular system: S1 & S2 heard, RRR. No JVD. Gastrointestinal system: Abdomen is nondistended, soft and nontender.  Extremities: No pedal edema. Skin: No rashes, lesions or  ulcers Psychiatry: Judgement and insight appear normal. Mood & affect appropriate. Data Reviewed:    Labs: Basic Metabolic Panel: Recent Labs  Lab 01/11/24 2109 01/12/24 0554 01/13/24 0618 01/14/24 0509 01/15/24 0743 01/16/24 0435  NA 135 139 138 140 140 139  K 3.4* 3.4* 3.2* 3.9 3.4* 3.8  CL 103 107 103 112* 107 107  CO2 16* 21* 24 21* 22 22  GLUCOSE 292* 203* 252* 103* 124* 107*  BUN 9 5* 7 7 <5* <5*  CREATININE 0.89 0.82 0.67 0.73 0.66 0.61  CALCIUM  8.8* 8.6* 8.5* 7.7* 8.6* 8.6*  MG 1.7  --  1.6* 1.6*  --   --   PHOS 1.9*  --  3.9  --  3.4 3.0   GFR Estimated Creatinine Clearance: 81.6 mL/min (by C-G formula based on SCr of 0.61 mg/dL). Liver Function Tests: Recent Labs  Lab 01/11/24 1054 01/11/24 2109 01/12/24 0554 01/13/24 0618 01/15/24 0743 01/15/24 1106 01/16/24 0435  AST 36 34 29 17  --  15  --   ALT 18 16 13 13   --  10  --   ALKPHOS 157* 155* 140* 147*  --  90  --   BILITOT 0.7 0.8 0.7 0.5  --  0.3  --   PROT 7.2 6.5 5.7* 5.8*  --  5.2*  --   ALBUMIN  3.7 3.3* 2.9* 2.9* 2.9* 2.7* 2.9*   Recent Labs  Lab 01/11/24 1054 01/13/24 0618 01/14/24 0509  LIPASE 151* 191* 132*   No results for input(s): AMMONIA in the last 168 hours. Coagulation profile Recent Labs  Lab 01/11/24 1054  INR 2.1*   COVID-19 Labs  Recent Labs    01/15/24 1106  LDH 120  Lab Results  Component Value Date   SARSCOV2NAA NEGATIVE 01/11/2024    CBC: Recent Labs  Lab 01/11/24 1054 01/11/24 1221 01/12/24 0554 01/13/24 0618 01/14/24 0509 01/15/24 0743 01/16/24 0435  WBC 21.0*  --  21.0* 17.9* 14.1* 18.6* 16.8*  NEUTROABS 16.7*  --   --   --   --   --   --   HGB 12.8   < > 10.8* 11.1* 8.9* 10.2* 10.3*  HCT 37.3   < > 32.0* 33.3* 27.1* 30.4* 31.3*  MCV 85.9  --  87.0 86.7 90.6 89.1 90.7  PLT 325  325  --  331 407* 363 489* 535*   < > = values in this interval not displayed.   Cardiac Enzymes: No results for input(s): CKTOTAL, CKMB, CKMBINDEX,  TROPONINI in the last 168 hours. BNP (last 3 results) No results for input(s): PROBNP in the last 8760 hours. CBG: Recent Labs  Lab 01/15/24 1109 01/15/24 1619 01/15/24 1945 01/15/24 2331 01/16/24 0355  GLUCAP 102* 133* 95 103* 88   D-Dimer: No results for input(s): DDIMER in the last 72 hours.  Hgb A1c: No results for input(s): HGBA1C in the last 72 hours.  Lipid Profile: No results for input(s): CHOL, HDL, LDLCALC, TRIG, CHOLHDL, LDLDIRECT in the last 72 hours.  Thyroid function studies: No results for input(s): TSH, T4TOTAL, T3FREE, THYROIDAB in the last 72 hours.  Invalid input(s): FREET3 Anemia work up: No results for input(s): VITAMINB12, FOLATE, FERRITIN, TIBC, IRON, RETICCTPCT in the last 72 hours.  Sepsis Labs: Recent Labs  Lab 01/11/24 2109 01/11/24 2232 01/12/24 0332 01/12/24 0554 01/13/24 0618 01/14/24 0509 01/15/24 0743 01/16/24 0435  PROCALCITON 0.36  --   --   --   --   --   --   --   WBC  --   --   --  21.0* 17.9* 14.1* 18.6* 16.8*  LATICACIDVEN 3.0* 3.6* 2.2* 3.0*  --   --   --   --    Microbiology Recent Results (from the past 240 hours)  Resp panel by RT-PCR (RSV, Flu A&B, Covid) Anterior Nasal Swab     Status: None   Collection Time: 01/11/24 10:14 AM   Specimen: Anterior Nasal Swab  Result Value Ref Range Status   SARS Coronavirus 2 by RT PCR NEGATIVE NEGATIVE Final    Comment: (NOTE) SARS-CoV-2 target nucleic acids are NOT DETECTED.  The SARS-CoV-2 RNA is generally detectable in upper respiratory specimens during the acute phase of infection. The lowest concentration of SARS-CoV-2 viral copies this assay can detect is 138 copies/mL. A negative result does not preclude SARS-Cov-2 infection and should not be used as the sole basis for treatment or other patient management decisions. A negative result may occur with  improper specimen collection/handling, submission of specimen other than  nasopharyngeal swab, presence of viral mutation(s) within the areas targeted by this assay, and inadequate number of viral copies(<138 copies/mL). A negative result must be combined with clinical observations, patient history, and epidemiological information. The expected result is Negative.  Fact Sheet for Patients:  bloggercourse.com  Fact Sheet for Healthcare Providers:  seriousbroker.it  This test is no t yet approved or cleared by the [place]  FDA and  has been authorized for detection and/or diagnosis of SARS-CoV-2 by FDA under an Emergency Use Authorization (EUA). This EUA will remain  in effect (meaning this test can be used) for the duration of the COVID-19 declaration under Section 564(b)(1) of the Act, 21 U.S.C.section 360bbb-3(b)(1),  unless the authorization is terminated  or revoked sooner.       Influenza A by PCR NEGATIVE NEGATIVE Final   Influenza B by PCR NEGATIVE NEGATIVE Final    Comment: (NOTE) The Xpert Xpress SARS-CoV-2/FLU/RSV plus assay is intended as an aid in the diagnosis of influenza from Nasopharyngeal swab specimens and should not be used as a sole basis for treatment. Nasal washings and aspirates are unacceptable for Xpert Xpress SARS-CoV-2/FLU/RSV testing.  Fact Sheet for Patients: bloggercourse.com  Fact Sheet for Healthcare Providers: seriousbroker.it  This test is not yet approved or cleared by the [place]  FDA and has been authorized for detection and/or diagnosis of SARS-CoV-2 by FDA under an Emergency Use Authorization (EUA). This EUA will remain in effect (meaning this test can be used) for the duration of the COVID-19 declaration under Section 564(b)(1) of the Act, 21 U.S.C. section 360bbb-3(b)(1), unless the authorization is terminated or revoked.     Resp Syncytial Virus by PCR NEGATIVE NEGATIVE Final    Comment:  (NOTE) Fact Sheet for Patients: bloggercourse.com  Fact Sheet for Healthcare Providers: seriousbroker.it  This test is not yet approved or cleared by the [place]  FDA and has been authorized for detection and/or diagnosis of SARS-CoV-2 by FDA under an Emergency Use Authorization (EUA). This EUA will remain in effect (meaning this test can be used) for the duration of the COVID-19 declaration under Section 564(b)(1) of the Act, 21 U.S.C. section 360bbb-3(b)(1), unless the authorization is terminated or revoked.  Performed at [facility], [address] [place], [place] [address]   Culture, blood (routine x 2)     Status: None   Collection Time: 01/11/24 10:32 AM   Specimen: BLOOD  Result Value Ref Range Status   Specimen Description   Final    BLOOD LEFT ANTECUBITAL Performed at [facility], [address] [place], [place] [address]    Special Requests   Final    BOTTLES DRAWN AEROBIC AND ANAEROBIC Blood Culture adequate volume Performed at [facility], [address] [place], [place] [address]    Culture   Final    NO GROWTH 5 DAYS Performed at [facility], [address], [place], [place] [address]    Report Status 01/16/2024 FINAL  Final  MRSA Next Gen by PCR, Nasal     Status: None   Collection Time: 01/11/24  7:01 PM   Specimen: Nasal Mucosa; Nasal Swab  Result Value Ref Range Status   MRSA by PCR Next Gen NOT DETECTED NOT DETECTED Final    Comment: (NOTE) The GeneXpert MRSA Assay (FDA approved for NASAL specimens only), is one component of a comprehensive MRSA colonization surveillance program. It is not intended to diagnose MRSA infection nor to guide or monitor treatment for MRSA infections. Test performance is not FDA approved in patients less than 71 years old. Performed at [facility], [address] [place], [place] [address]   Culture,  blood (routine x 2) Call MD if unable to obtain prior to antibiotics being given     Status: None   Collection Time: 01/11/24  9:09 PM   Specimen: BLOOD LEFT HAND  Result Value Ref Range Status   Specimen Description   Final    BLOOD LEFT HAND Performed at [facility], [address], [place], [place] [address]    Special Requests   Final    BOTTLES DRAWN AEROBIC ONLY Blood  Culture results may not be optimal due to an inadequate volume of blood received in culture bottles Performed at [facility], [address] [place], [place] [address]    Culture   Final    NO GROWTH 5 DAYS Performed at [facility], [address], [place], [place] [address]    Report Status 01/16/2024 FINAL  Final  Culture, blood (routine x 2) Call MD if unable to obtain prior to antibiotics being given     Status: None   Collection Time: 01/11/24  9:09 PM   Specimen: BLOOD LEFT HAND  Result Value Ref Range Status   Specimen Description   Final    BLOOD LEFT HAND Performed at [facility], [address], [place], [place] [address]    Special Requests   Final    BOTTLES DRAWN AEROBIC ONLY Blood Culture adequate volume Performed at [facility], [address] [place], [place] [address]    Culture   Final    NO GROWTH 5 DAYS Performed at [facility], [address], [place], [place] [address]    Report Status 01/16/2024 FINAL  Final     Medications:    Chlorhexidine  Gluconate Cloth  6 each Topical Daily   cyanocobalamin   1,000 mcg Oral Daily   DULoxetine   120 mg Oral Daily   folic acid   1 mg Oral Daily   gabapentin   400 mg Oral TID   hydroxychloroquine   300 mg Oral Daily   insulin  aspart  0-15 Units Subcutaneous Q4H   insulin  glargine-yfgn  7 Units Subcutaneous Daily   insulin  starter kit- pen needles  1 kit Other Once   living well with diabetes book   Does not apply Once   loratadine   10 mg Oral Daily   metoprolol  tartrate  50  mg Oral BID   montelukast   10 mg Oral QHS   pantoprazole   40 mg Oral Daily   Continuous Infusions:  sodium chloride  75 mL/hr at 01/16/24 0640   ampicillin -sulbactam (UNASYN ) IV Stopped (01/16/24 0245)   doxycycline  (VIBRAMYCIN ) IV Stopped (01/16/24 9360)   famotidine  (PEPCID ) IV Stopped (01/15/24 2149)   heparin  1,500 Units/hr (01/16/24 0640)   promethazine  (PHENERGAN ) injection (IM or IVPB) Stopped (01/13/24 1231)      LOS: 5 days   Erle Odell Castor  [facility]  01/16/2024, 7:10 AM

## 2024-01-16 NOTE — Consult Note (Signed)
 [facility]    Date of Admission:  01/11/2024     Reason for Consult: pancreatitis/pna    Referring Provider: Odell Castor     Lines:  Peripheral iv's  Abx: 12/16-c doxy 12/16-c amp-sulb  01/11/24-12/16 levo 01/11/24-12/16 aztreonam         Assessment: 48 y.o. 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 Do not believe this early that she would have pancreatitis related intraabdominal infectious process. Hx of nausea and while could be pancreatitis would check urine legionella antigen. If positive might need longer course of tx with doxy  ------------ 12/17 id assessment Tolerating doxy/amp-sulb S/p thoracentesis  12/16 -- 18k wbc neutrophilic - pending light's labs 12/16 legionella urine ag in process  Reviewed 12/16 cxr; no loculation of pleural effusion and no pneumothorax post thoracentesis  Patient feels significantly better with breathing after thoracentesis and no longer on supplemental oxygen -- doesn't appear culture was sent  Afebrile     Plan: continue doxy/amp-sulb; primary team plan 10 days total abx reasonable If legionella ag positive could dc amp/sulbactam When taking oral well and sustained clinical improvement could transition to oral doxy/amox-clav Pancreatitis supportive care per primary team Maintain standard isolation precaution Id will sign  off     ------------------------------------------------ Principal Problem:   Severe sepsis without septic shock (HCC) Active Problems:   Community acquired pneumonia   Lobar pneumonia, unspecified organism   Acute pancreatitis   Acute pulmonary embolism without acute cor pulmonale (HCC)    Subjective: Afebrile Hd stable Still some left sided chest pain No longer dyspneic; off o2 -- all after thoracentesis   Family History  Problem Relation Age of Onset   Diabetes Mellitus II Father     Social History[1]  Allergies[2]  Review of Systems: ROS All Other ROS was negative, except mentioned above   Past Medical History:  Diagnosis Date   Abnormal glucose    Adjustment disorder    Asplenia    Cerebral aneurysm    Cerebral degeneration associated with generalized lipidosis    Depression    Diabetes mellitus without complication (HCC)    Hypertension    OCD (obsessive compulsive disorder)    Peripheral neuropathy        Scheduled Meds:  Chlorhexidine  Gluconate Cloth  6 each Topical Daily   cyanocobalamin   1,000 mcg Oral Daily   DULoxetine   120 mg Oral Daily   folic acid   1 mg Oral Daily   gabapentin   400 mg Oral TID   hydroxychloroquine   300 mg Oral Daily   insulin  aspart  0-15 Units Subcutaneous Q4H   insulin  glargine-yfgn  7 Units Subcutaneous Daily   insulin  starter kit- pen needles  1 kit Other Once   living well with diabetes book   Does not apply Once   loratadine   10 mg Oral Daily   metoprolol  tartrate  50 mg Oral BID   montelukast   10 mg Oral QHS   pantoprazole   40 mg Oral  Daily   Continuous Infusions:  ampicillin -sulbactam (UNASYN ) IV Stopped (01/16/24 0806)   doxycycline  (VIBRAMYCIN ) IV Stopped (01/16/24 9360)   famotidine  (PEPCID ) IV Stopped (01/16/24 0958)   heparin  1,500 Units/hr (01/16/24 1000)   promethazine  (PHENERGAN ) injection (IM or IVPB) Stopped (01/13/24 1231)   PRN Meds:.acetaminophen , albuterol , dextrose , HYDROmorphone   (DILAUDID ) injection, labetalol , ondansetron  **OR** ondansetron  (ZOFRAN ) IV, mouth rinse, promethazine  (PHENERGAN ) injection (IM or IVPB), tiZANidine    OBJECTIVE: Blood pressure (!) 163/93, pulse (!) 103, temperature 99.1 F (37.3 C), temperature source Oral, resp. rate (!) 29, height 5' 1 (1.549 m), weight 75.3 kg, SpO2 94%.  Physical Exam  General/constitutional: no distress, pleasant HEENT: Normocephalic, PER, Conj Clear, EOMI, Oropharynx clear Neck supple CV: rrr no mrg Lungs: on room air; normal respiratory effort Abd: Soft, Nontender Ext: no edema Skin: No Rash Neuro: nonfocal MSK: no peripheral joint swelling/tenderness/warmth; back spines nontender     Lab Results Lab Results  Component Value Date   WBC 16.8 (H) 01/16/2024   HGB 10.3 (L) 01/16/2024   HCT 31.3 (L) 01/16/2024   MCV 90.7 01/16/2024   PLT 535 (H) 01/16/2024    Lab Results  Component Value Date   CREATININE 0.61 01/16/2024   BUN <5 (L) 01/16/2024   NA 139 01/16/2024   K 3.8 01/16/2024   CL 107 01/16/2024   CO2 22 01/16/2024    Lab Results  Component Value Date   ALT 10 01/15/2024   AST 15 01/15/2024   ALKPHOS 90 01/15/2024   BILITOT 0.3 01/15/2024      Microbiology: Recent Results (from the past 240 hours)  Resp panel by RT-PCR (RSV, Flu A&B, Covid) Anterior Nasal Swab     Status: None   Collection Time: 01/11/24 10:14 AM   Specimen: Anterior Nasal Swab  Result Value Ref Range Status   SARS Coronavirus 2 by RT PCR NEGATIVE NEGATIVE Final    Comment: (NOTE) SARS-CoV-2 target nucleic acids are NOT DETECTED.  The SARS-CoV-2 RNA is generally detectable in upper respiratory specimens during the acute phase of infection. The lowest concentration of SARS-CoV-2 viral copies this assay can detect is 138 copies/mL. A negative result does not preclude SARS-Cov-2 infection and should not be used as the sole basis for treatment or other patient management decisions. A negative result may occur  with  improper specimen collection/handling, submission of specimen other than nasopharyngeal swab, presence of viral mutation(s) within the areas targeted by this assay, and inadequate number of viral copies(<138 copies/mL). A negative result must be combined with clinical observations, patient history, and epidemiological information. The expected result is Negative.  Fact Sheet for Patients:  bloggercourse.com  Fact Sheet for Healthcare Providers:  seriousbroker.it  This test is no t yet approved or cleared by the [place]  FDA and  has been authorized for detection and/or diagnosis of SARS-CoV-2 by FDA under an Emergency Use Authorization (EUA). This EUA will remain  in effect (meaning this test can be used) for the duration of the COVID-19 declaration under Section 564(b)(1) of the Act, 21 U.S.C.section 360bbb-3(b)(1), unless the authorization is terminated  or revoked sooner.       Influenza A by PCR NEGATIVE NEGATIVE Final   Influenza B by PCR NEGATIVE NEGATIVE Final    Comment: (NOTE) The Xpert Xpress SARS-CoV-2/FLU/RSV plus assay is intended as an aid in the diagnosis of influenza from Nasopharyngeal swab specimens and should not be used as a sole basis for treatment. Nasal washings and aspirates are unacceptable for Xpert Xpress SARS-CoV-2/FLU/RSV testing.  Fact Sheet for Patients: bloggercourse.com  Fact Sheet for Healthcare Providers: seriousbroker.it  This test is not yet approved or cleared by the [place]  FDA and has been authorized for detection and/or diagnosis of SARS-CoV-2 by FDA under an Emergency Use Authorization (EUA). This EUA will remain in effect (meaning this test can be used) for the duration of the COVID-19 declaration under Section 564(b)(1) of the Act, 21 U.S.C. section 360bbb-3(b)(1), unless the authorization is terminated  or revoked.     Resp Syncytial Virus by PCR NEGATIVE NEGATIVE Final    Comment: (NOTE) Fact Sheet for Patients: bloggercourse.com  Fact Sheet for Healthcare Providers: seriousbroker.it  This test is not yet approved or cleared by the [place]  FDA and has been authorized for detection and/or diagnosis of SARS-CoV-2 by FDA under an Emergency Use Authorization (EUA). This EUA will remain in effect (meaning this test can be used) for the duration of the COVID-19 declaration under Section 564(b)(1) of the Act, 21 U.S.C. section 360bbb-3(b)(1), unless the authorization is terminated or revoked.  Performed at [facility], [address] [place], [place] [address]   Culture, blood (routine x 2)     Status: None   Collection Time: 01/11/24 10:32 AM   Specimen: BLOOD  Result Value Ref Range Status   Specimen Description   Final    BLOOD LEFT ANTECUBITAL Performed at [facility], [address] [place], [place] [address]    Special Requests   Final    BOTTLES DRAWN AEROBIC AND ANAEROBIC Blood Culture adequate volume Performed at [facility], [address] [place], [place] [address]    Culture   Final    NO GROWTH 5 DAYS Performed at [facility], [address], [place], [place] [address]    Report Status 01/16/2024 FINAL  Final  MRSA Next Gen by PCR, Nasal     Status: None   Collection Time: 01/11/24  7:01 PM   Specimen: Nasal Mucosa; Nasal Swab  Result Value Ref Range Status   MRSA by PCR Next Gen NOT DETECTED NOT DETECTED Final    Comment: (NOTE) The GeneXpert MRSA Assay (FDA approved for NASAL specimens only), is one component of a comprehensive MRSA colonization surveillance program. It is not intended to diagnose MRSA infection nor to guide or monitor treatment for MRSA infections. Test performance is not FDA approved in patients less than 22 years old. Performed at [facility], [address], [place], [place] [address]   Culture, blood (routine x 2) Call MD if unable to obtain prior to antibiotics being given     Status: None   Collection Time: 01/11/24  9:09 PM   Specimen: BLOOD LEFT HAND  Result Value Ref Range Status   Specimen Description   Final    BLOOD LEFT HAND Performed at [facility], [address] [place], [place] [address]    Special Requests   Final    BOTTLES DRAWN AEROBIC ONLY Blood Culture results may not be optimal due to an inadequate volume of blood received in culture bottles Performed at [facility], [address] [place], [place] [address]    Culture   Final    NO GROWTH 5 DAYS Performed at [facility], [address], [place], [place] [address]    Report Status 01/16/2024 FINAL  Final  Culture, blood (routine x 2) Call MD if unable to obtain prior  to antibiotics being given     Status: None   Collection Time: 01/11/24  9:09 PM   Specimen: BLOOD LEFT HAND  Result Value Ref Range Status   Specimen Description   Final    BLOOD LEFT HAND Performed at [facility], [address], [place], [place] [address]    Special Requests   Final    BOTTLES DRAWN AEROBIC ONLY Blood Culture adequate volume Performed at [facility], [address], [place], [place] [address]    Culture   Final    NO GROWTH 5 DAYS Performed at [facility], [address] [place], [place] [address]    Report Status 01/16/2024 FINAL  Final     Serology:    Imaging: If present, new imagings (plain films, ct scans, and mri) have been personally visualized and interpreted; radiology reports have been reviewed. Decision making incorporated into the Impression / Recommendations.   12/16 ct abd pelv with contrast 1. No acute findings in the abdomen and pelvis. 2. Hepatic steatosis. 3. Large left pleural effusion with passive atelectasis of the left lower  lobe.   12/14 u/s abd No evidence of cholelithiasis or biliary ductal dilatation.   Diffuse hepatic steatosis.   12/12 chest abd pelv ct with pe protocol 1. Acute bilateral pulmonary emboli including some lobar artery involvement (RV/LV ratio 0.71, no obvious heart strain). Possible left common femoral vein deep venous thrombosis; Ultrasound would be necessary to confirm. 2. Superimposed multifocal peribronchial and streaky lung opacity, more consistent with bronchopneumonia/infection than pulmonary infarction. No pleural effusion. 3. Evidence of Acute Pancreatitis limited to the pancreatic tail. 4. Fluid and possible mucosal hyperenhancement throughout nondilated small and large bowel loops, suggesting acute enteritis / diarrhea. Normal appendix. 5. Chronically absent spleen. Chronic hepatic steatosis, calcified aortic atherosclerosis.   12/16 cxr Narrative & Impression  EXAM: 1 VIEW(S) XRAY OF THE CHEST 01/15/2024 04:26:00 PM   COMPARISON: Comparison with 07/31/2023.   CLINICAL HISTORY: 758136 S/P thoracentesis [id number] S/P thoracentesis   FINDINGS:   LINES, TUBES AND DEVICES: There is a power port type central venous catheter with the tip over the low SVC region.   LUNGS AND PLEURA: Shallow inspiration. Small left pleural effusion with basilar atelectasis or infiltration. No pneumothorax. The right lung is clear.   HEART AND MEDIASTINUM: Heart size and pulmonary vascularity are normal.   BONES AND SOFT TISSUES: Surgical clips in the left upper quadrant. No acute osseous abnormality.   IMPRESSION: 1. No pneumothorax. 2. Small left pleural effusion with basilar atelectasis or infiltration.    Constance ONEIDA Passer, MD [facility] [phone] pager    01/16/2024, 12:01 PM      [1]  Social History Tobacco Use   Smoking status: Never   Smokeless tobacco: Never  Substance Use Topics   Alcohol use: No   Drug  use: No  [2]  Allergies Allergen Reactions   Ceftin [Cefuroxime Axetil] Shortness Of Breath and Swelling    Tolerates ceftriaxone , penicillins and cephalexin     Cinobac [Cinoxacin] Shortness Of Breath and Swelling    Has received Levaquin  in inpt & outpt setting   Fire Ant Anaphylaxis   Sulfa Antibiotics Other (See Comments)    Unknown

## 2024-01-16 NOTE — Plan of Care (Signed)
°  Problem: Clinical Measurements: Goal: Ability to maintain clinical measurements within normal limits will improve Outcome: Progressing Goal: Will remain free from infection Outcome: Progressing Goal: Diagnostic test results will improve Outcome: Progressing Goal: Respiratory complications will improve Outcome: Progressing Goal: Cardiovascular complication will be avoided Outcome: Progressing   Problem: Activity: Goal: Risk for activity intolerance will decrease Outcome: Progressing   Problem: Safety: Goal: Ability to remain free from injury will improve Outcome: Progressing   Problem: Activity: Goal: Ability to tolerate increased activity will improve Outcome: Progressing   Problem: Clinical Measurements: Goal: Ability to maintain a body temperature in the normal range will improve Outcome: Progressing

## 2024-01-16 NOTE — Plan of Care (Signed)
   Problem: Education: Goal: Knowledge of General Education information will improve Description Including pain rating scale, medication(s)/side effects and non-pharmacologic comfort measures Outcome: Progressing   Problem: Health Behavior/Discharge Planning: Goal: Ability to manage health-related needs will improve Outcome: Progressing

## 2024-01-16 NOTE — Evaluation (Signed)
 Physical Therapy Evaluation Patient Details Name: Megan Weber MRN: [id number] DOB: 1975/06/17 Today's Date: 01/16/2024  History of Present Illness  Pt is 48 yo female admitted on 01/11/24 with sepsis due to multifocal PNE. Pt also with new pleural effusion on L s/p thoracentesis on 01/15/24, new bil PE started on heparin , and acute idiopathic pancreatitis.  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 At baseline, pt reports independent with adls and iadls.  She is typically ambulatory without AD.  Today, pt ambulating 200' with supervision but reports feels weaker than baseline and fatigued easily.  She was on RA with O2 sats >93% throughout session.  Educated pt on frequent bouts of short activity upon return home and gradually increasing endurance.  Will maintain on acute caseload due to complex medical case with comorbidities but expect no PT needs at d/c.  Pt currently with functional limitations due to the deficits listed below (see PT Problem List). Pt will benefit from acute skilled PT to increase their independence and safety with mobility to allow discharge.           If plan is discharge home, recommend the following: Assistance with cooking/housework;Help with stairs or ramp for entrance   Can travel by private vehicle        Equipment Recommendations None recommended by PT  Recommendations for Other Services       Functional Status Assessment Patient has had a recent decline in their functional status and demonstrates the ability to make significant improvements in function in a reasonable and predictable amount of time.     Precautions /  Restrictions Precautions Precautions: Fall      Mobility  Bed Mobility Overal bed mobility: Needs Assistance Bed Mobility: Supine to Sit, Sit to Supine     Supine to sit: Modified independent (Device/Increase time) Sit to supine: Modified independent (Device/Increase time)        Transfers Overall transfer level: Needs assistance Equipment used: None Transfers: Sit to/from Stand Sit to Stand: Supervision                Ambulation/Gait Ambulation/Gait assistance: Supervision Gait Distance (Feet): 200 Feet Assistive device: None Gait Pattern/deviations: Step-through pattern Gait velocity: decreased     General Gait Details: Has been ambulating in room on her own but does report feels weak and easily fatigued compared to baseline.  Supervision to ambulate in hallway, no LOB but did have couple of episodes of pain in L side that caused altered gait, fatigued easily  Stairs            Wheelchair Mobility     Tilt Bed    Modified Rankin (Stroke Patients Only)       Balance Overall balance assessment: Needs assistance Sitting-balance support: No upper extremity supported Sitting balance-Leahy Scale: Good     Standing balance support: No upper extremity supported Standing balance-Leahy Scale: Good Standing balance comment: Could ambulate without AD.  Pt perfoming ADLs in standing (donning shorts and shoes) but leaning on wall to do so                             Pertinent Vitals/Pain Pain Assessment Pain Assessment: 0-10 Pain Score: 7  Pain Location:  L abdomen Pain Descriptors / Indicators: Discomfort Pain Intervention(s): Limited activity within patient's tolerance, Monitored during session    Home Living Family/patient expects to be discharged to:: Private residence Living Arrangements: Parent Available Help at Discharge: Family;Available 24 hours/day Type of Home: House Home Access: Stairs to enter Entrance Stairs-Rails:  Doctor, General Practice of Steps: 4-5   Home Layout: One level Home Equipment: Grab bars - tub/shower;Rolling Walker (2 wheels);Rollator (4 wheels);Wheelchair - manual      Prior Function Prior Level of Function : Independent/Modified Independent             Mobility Comments: Typically ambulates without AD; may use RW if back pain flairs ADLs Comments: independent with adls and iadls; does not drive due to dizzy spells     Extremity/Trunk Assessment   Upper Extremity Assessment Upper Extremity Assessment: Generalized weakness    Lower Extremity Assessment Lower Extremity Assessment: Generalized weakness    Cervical / Trunk Assessment Cervical / Trunk Assessment: Normal  Communication        Cognition Arousal: Alert Behavior During Therapy: WFL for tasks assessed/performed   PT - Cognitive impairments: No apparent impairments                                 Cueing       General Comments General comments (skin integrity, edema, etc.): On RA with sats >93%.  HR ranged 105-125 bpm.  Pt reports hx of dizzy spells - states not occured in at least a month.  Does report going to outpt therapy and being taught exercises. Did not recall it being BPPV.  Did not further test as has not occurred recently.    Exercises     Assessment/Plan    PT Assessment Patient needs continued PT services  PT Problem List Decreased strength;Cardiopulmonary status limiting activity;Decreased activity tolerance;Decreased balance;Decreased mobility       PT Treatment Interventions DME instruction;Gait training;Stair training;Functional mobility training;Therapeutic activities;Patient/family education;Therapeutic exercise;Balance training    PT Goals (Current goals can be found in the Care Plan section)  Acute Rehab PT Goals Patient Stated Goal: return home PT Goal Formulation: With patient Time For Goal Achievement: 01/30/24 Potential to Achieve Goals: Good     Frequency Min 1X/week     Co-evaluation               AM-PAC PT 6 Clicks Mobility  Outcome Measure Help needed turning from your back to your side while in a flat bed without using bedrails?: None Help needed moving from lying on your back to sitting on the side of a flat bed without using bedrails?: None Help needed moving to and from a bed to a chair (including a wheelchair)?: A Little Help needed standing up from a chair using your arms (e.g., wheelchair or bedside chair)?: A Little Help needed to walk in hospital room?: A Little Help needed climbing 3-5 steps with a railing? : A Little 6 Click Score: 20    End of Session Equipment Utilized During Treatment: Gait belt Activity Tolerance: Patient tolerated treatment well Patient left: in chair;with call bell/phone within reach (no alarm as pt ambulating in room independently; she did ask about getting back in bed when ready - put rail down and made sure lines correct position for pt to stand pivot on her own) Nurse Communication: Mobility status PT Visit Diagnosis: Other abnormalities of gait and mobility (R26.89);Muscle weakness (generalized) (M62.81)  Time: 1221-1241 PT Time Calculation (min) (ACUTE ONLY): 20 min   Charges:   PT Evaluation $PT Eval Low Complexity: 1 Low   PT General Charges $$ ACUTE PT VISIT: 1 Visit         Benjiman, PT Acute Rehab [facility] [phone]   Benjiman VEAR Mulberry 01/16/2024, 1:07 PM

## 2024-01-16 NOTE — Progress Notes (Signed)
 PHARMACY - ANTICOAGULATION CONSULT NOTE  Pharmacy Consult for Heparin  Indication: pulmonary embolus  Allergies[1]  Patient Measurements: Height: 5' 1 (154.9 cm) Weight: 75.3 kg (166 lb) IBW/kg (Calculated) : 47.8 HEPARIN  DW (KG): 64.4  Vital Signs: Temp: 98.4 F (36.9 C) (12/17 0404) Temp Source: Oral (12/17 0404) BP: 135/90 (12/17 0634) Pulse Rate: 102 (12/17 0634)  Labs: Recent Labs    01/14/24 0509 01/14/24 1215 01/14/24 1835 01/15/24 0514 01/15/24 0743 01/16/24 0435  HGB 8.9*  --   --   --  10.2* 10.3*  HCT 27.1*  --   --   --  30.4* 31.3*  PLT 363  --   --   --  489* 535*  HEPARINUNFRC 0.25*   < > 0.36 0.42  --  0.38  CREATININE 0.73  --   --   --  0.66 0.61   < > = values in this interval not displayed.    Estimated Creatinine Clearance: 81.6 mL/min (by C-G formula based on SCr of 0.61 mg/dL).  Medications: Infusions:   sodium chloride  75 mL/hr at 01/16/24 0640   ampicillin -sulbactam (UNASYN ) IV Stopped (01/16/24 0245)   doxycycline  (VIBRAMYCIN ) IV Stopped (01/16/24 9360)   famotidine  (PEPCID ) IV Stopped (01/15/24 2149)   heparin  1,500 Units/hr (01/16/24 0711)   promethazine  (PHENERGAN ) injection (IM or IVPB) Stopped (01/13/24 1231)     Assessment: 76 yoF presented to ED on 12/12 with CP and vomiting. Found to have bilateral PE's and possible DVT.  Pt is on chronic low dose Xarelto  10mg , but patient reports noncompliance.  Noted PTA medroxyprogesterone injection (last dose 12/31/23) and may increase risk of VTE.  Pharmacy is consulted to dose Heparin  IV.  Today, 01/16/2024:  Heparin  level 0.38, therapeutic on heparin  1500 units/hr.  CBC: Hgb low/improved to 10.3, Plt elevated No bleeding or complications reported (s/p thoracentesis on 12/16)  Goal of Therapy:  Heparin  level 0.3-0.7 units/ml Monitor platelets by anticoagulation protocol: Yes   Plan:  Con't IV heparin  1500 units/hr Daily heparin  level and CBC Follow up plan for transition to oral  anticoagulation.  Wanda Hasting PharmD, BCPS [facility] main pharmacy [phone] 01/16/2024 7:28 AM          [1]  Allergies Allergen Reactions   Ceftin [Cefuroxime Axetil] Shortness Of Breath and Swelling   Cinobac [Cinoxacin] Shortness Of Breath and Swelling    Has received Levaquin  in inpt & outpt setting   Fire Ant Anaphylaxis   Sulfa Antibiotics Other (See Comments)    Unknown

## 2024-01-17 ENCOUNTER — Inpatient Hospital Stay (HOSPITAL_COMMUNITY)

## 2024-01-17 DIAGNOSIS — D809 Immunodeficiency with predominantly antibody defects, unspecified: Secondary | ICD-10-CM

## 2024-01-17 DIAGNOSIS — A419 Sepsis, unspecified organism: Secondary | ICD-10-CM | POA: Diagnosis not present

## 2024-01-17 DIAGNOSIS — I2699 Other pulmonary embolism without acute cor pulmonale: Secondary | ICD-10-CM | POA: Diagnosis not present

## 2024-01-17 DIAGNOSIS — E1110 Type 2 diabetes mellitus with ketoacidosis without coma: Secondary | ICD-10-CM | POA: Diagnosis not present

## 2024-01-17 DIAGNOSIS — K859 Acute pancreatitis without necrosis or infection, unspecified: Secondary | ICD-10-CM | POA: Diagnosis not present

## 2024-01-17 LAB — GLUCOSE, CAPILLARY
Glucose-Capillary: 121 mg/dL — ABNORMAL HIGH (ref 70–99)
Glucose-Capillary: 153 mg/dL — ABNORMAL HIGH (ref 70–99)
Glucose-Capillary: 164 mg/dL — ABNORMAL HIGH (ref 70–99)
Glucose-Capillary: 186 mg/dL — ABNORMAL HIGH (ref 70–99)
Glucose-Capillary: 215 mg/dL — ABNORMAL HIGH (ref 70–99)

## 2024-01-17 LAB — HEPARIN LEVEL (UNFRACTIONATED): Heparin Unfractionated: 0.28 [IU]/mL — ABNORMAL LOW (ref 0.30–0.70)

## 2024-01-17 LAB — CBC
HCT: 30.2 % — ABNORMAL LOW (ref 36.0–46.0)
Hemoglobin: 10.1 g/dL — ABNORMAL LOW (ref 12.0–15.0)
MCH: 29.6 pg (ref 26.0–34.0)
MCHC: 33.4 g/dL (ref 30.0–36.0)
MCV: 88.6 fL (ref 80.0–100.0)
Platelets: 595 K/uL — ABNORMAL HIGH (ref 150–400)
RBC: 3.41 MIL/uL — ABNORMAL LOW (ref 3.87–5.11)
RDW: 14.8 % (ref 11.5–15.5)
WBC: 13.9 K/uL — ABNORMAL HIGH (ref 4.0–10.5)
nRBC: 0.2 % (ref 0.0–0.2)

## 2024-01-17 LAB — LEGIONELLA PNEUMOPHILA SEROGP 1 UR AG: L. pneumophila Serogp 1 Ur Ag: NEGATIVE

## 2024-01-17 LAB — BODY FLUID CELL COUNT WITH DIFFERENTIAL
Eos, Fluid: 0 %
Lymphs, Fluid: 1 %
Monocyte-Macrophage-Serous Fluid: 11 % — ABNORMAL LOW (ref 50–90)
Neutrophil Count, Fluid: 88 % — ABNORMAL HIGH (ref 0–25)
Total Nucleated Cell Count, Fluid: 17117 uL — ABNORMAL HIGH (ref 0–1000)

## 2024-01-17 MED ORDER — OXYCODONE HCL 5 MG PO TABS
20.0000 mg | ORAL_TABLET | ORAL | Status: DC | PRN
  Administered 2024-01-17: 15:00:00 20 mg via ORAL
  Filled 2024-01-17: qty 4

## 2024-01-17 MED ORDER — HYDROMORPHONE HCL 1 MG/ML IJ SOLN
0.5000 mg | INTRAMUSCULAR | Status: DC | PRN
  Administered 2024-01-17: 16:00:00 0.5 mg via INTRAVENOUS
  Filled 2024-01-17: qty 0.5

## 2024-01-17 MED ORDER — KETOROLAC TROMETHAMINE 15 MG/ML IJ SOLN: 15.0000 mg | Freq: Four times a day (QID) | INTRAMUSCULAR | Status: DC | PRN

## 2024-01-17 MED ORDER — RIVAROXABAN 15 MG PO TABS
15.0000 mg | ORAL_TABLET | Freq: Two times a day (BID) | ORAL | Status: DC
  Administered 2024-01-17 – 2024-01-18 (×3): 15 mg via ORAL
  Filled 2024-01-17 (×3): qty 1

## 2024-01-17 MED ORDER — RIVAROXABAN 20 MG PO TABS: 20.0000 mg | ORAL_TABLET | Freq: Every day | ORAL | Status: DC

## 2024-01-17 MED ORDER — LOPERAMIDE HCL 2 MG PO CAPS
4.0000 mg | ORAL_CAPSULE | ORAL | Status: DC | PRN
  Administered 2024-01-17: 15:00:00 4 mg via ORAL
  Filled 2024-01-17: qty 2

## 2024-01-17 MED ORDER — HYDROMORPHONE HCL 1 MG/ML IJ SOLN: 0.5000 mg | INTRAMUSCULAR | Status: DC | PRN

## 2024-01-17 MED ORDER — PANTOPRAZOLE SODIUM 40 MG PO TBEC
40.0000 mg | DELAYED_RELEASE_TABLET | Freq: Two times a day (BID) | ORAL | Status: DC
  Administered 2024-01-17 – 2024-01-18 (×2): 40 mg via ORAL
  Filled 2024-01-17 (×2): qty 1

## 2024-01-17 MED ORDER — PSYLLIUM 95 % PO PACK
1.0000 | PACK | Freq: Every day | ORAL | Status: DC
  Administered 2024-01-17: 12:00:00 1 via ORAL
  Filled 2024-01-17 (×2): qty 1

## 2024-01-17 NOTE — Plan of Care (Signed)

## 2024-01-17 NOTE — Progress Notes (Signed)
 [facility] PROGRESS NOTE    Progress Note  Megan Weber  [name] DOB: 1975-06-05 DOA: 01/11/2024 PCP: Pia Kerney SQUIBB, MD   Brief Narrative:   Megan Weber is an 48 y.o. 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  In the ED was found to be septic with pneumonia and an acute PE.  Assessment/Plan:   Sepsis (HCC) due to multifocal pneumonia/with new pleural effusion on the left: T.max of 98.4. Continue IV Unasyn  and doxycycline  for 10 days. Status post thoracocentesis on 01/15/2024 it was done as she was complaining of left-sided flank pain, about 300 cc of turbid yellow fluid were drained.  Cytology showed 17,000 white blood cells 88 predominantly neutrophils.    New acute bilateral pulmonary embolism/she does have a history of PE: Likely due to noncompliance with her Xarelto . 2D echo showed an EF of 60% grade 1 diastolic dysfunction Hemoglobin has stabilized this morning is 10.3. She had a bowel movement yesterday she relates no bloody or melanotic stools. Lower extremity Doppler was negative for DVT. Transition IV heparin  to oral Xarelto .  Diabetes mellitus type 2 uncontrolled with hyperglycemia/DKA: Conservatively treated with IV fluids and IV insulin , DKA has resolved. She does not use a insulin  at home, she is just on metformin. A1c of 10.0.  Might need home insulin . Allow clear liquid diet. Continue long-acting insulin  per sliding scale.  Acute Idiopathic acute pancreatitis without infection or necrosis She relates her abdominal pain is improved, she would like to try a clear liquid diet.    Decrease narcotics. Monitor strict I's and O's.  Her urine output has picked up. CT scan of the abdomen pelvis showed no acute findings with large pleural effusion see above for the details. Etiology remains unclear.   Noted that Abilify can cause pancreatitis and currently holding. Need to follow-up with GI as an outpatient.  Thrombocytosis: Probably acting as an acute phase reactant. Continue to monitor intermittently.  New diarrhea: Started on Imodium , Metamucil.   Mild acute kidney injury: Now resolved.  Hypokalemia/hypomagnesemia:  Try to keep potassium greater than 4 magnesium  greater than 2. Potassium was low and was repleted recheck in the morning.  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 Gets Xenopozyme infusions every month.   Systemic mastocytosis: Followed by [facility] at home on immunotherapy these were held due to multifocal pneumonia. Can be resumed as an outpatient.  Chronic HFpEF: noted  Essential hypertension: Continue to hold antihypertensive medication except for metoprolol . Blood pressure is improved.  History of cerebral aneurysm: Noted  Morbid obesity: Noted she has been counseled.  DVT prophylaxis: lovenox  Family Communication:none Status is: Inpatient Remains inpatient appropriate because: Abscess due to multifocal pneumonia    Code Status:     Code Status Orders  (From admission, onward)           Start     Ordered   01/11/24 2024  Full code  Continuous       Question:  By:  Answer:  Default: patient does not have capacity for decision making, no surrogate or prior directive available   01/11/24 2026           Code Status History     Date Active Date Inactive Code Status  Order ID Comments User Context   04/10/2017 0432 04/10/2017 1610 Full Code [id number]  Franky Redia SAILOR, MD Inpatient         IV Access:   Peripheral IV   Procedures and diagnostic studies:   VAS US  LOWER EXTREMITY VENOUS  (DVT) Result Date: 01/17/2024  Lower Venous DVT Study Patient Name:  Megan Weber  Date of Exam:   01/15/2024 Medical Rec #: [id number]        Accession #:    [id number] Date of Birth: Apr 09, 1975        Patient Gender: F Patient Age:   30 years Exam Location:  [facility] Procedure:      VAS US  LOWER EXTREMITY VENOUS (DVT) Referring Phys: ERLE CLUNES ORTIZ --------------------------------------------------------------------------------  Indications: Pulmonary embolism.  Risk Factors: Hx of PE/DVT. Anticoagulation: Xarelto  - non compliant. Limitations: Poor ultrasound/tissue interface. Comparison Study: Previous LLEV on 07/25/2021 & RLEV on 05/31/2021 were both                   negative for DVT Performing Technologist: Ezzie Potters RVT, RDMS  Examination Guidelines: A complete evaluation includes B-mode imaging, spectral Doppler, color Doppler, and power Doppler as needed of all accessible portions of each vessel. Bilateral testing is considered an integral part of a complete examination. Limited examinations for reoccurring indications may be performed as noted. The reflux portion of the exam is performed with the patient in reverse Trendelenburg.  +---------+---------------+---------+-----------+----------+--------------+ RIGHT    CompressibilityPhasicitySpontaneityPropertiesThrombus Aging +---------+---------------+---------+-----------+----------+--------------+ CFV      Full           Yes      Yes                                 +---------+---------------+---------+-----------+----------+--------------+ SFJ      Full                                                        +---------+---------------+---------+-----------+----------+--------------+ FV Prox  Full           Yes      Yes                                 +---------+---------------+---------+-----------+----------+--------------+ FV Mid   Full           Yes      Yes                                  +---------+---------------+---------+-----------+----------+--------------+ FV DistalFull           Yes      Yes                                 +---------+---------------+---------+-----------+----------+--------------+ PFV      Full                                                        +---------+---------------+---------+-----------+----------+--------------+  POP      Full           Yes      Yes                                 +---------+---------------+---------+-----------+----------+--------------+ PTV      Full                                                        +---------+---------------+---------+-----------+----------+--------------+ PERO     Full                                                        +---------+---------------+---------+-----------+----------+--------------+   +---------+---------------+---------+-----------+----------+--------------+ LEFT     CompressibilityPhasicitySpontaneityPropertiesThrombus Aging +---------+---------------+---------+-----------+----------+--------------+ CFV      Full           Yes      Yes                                 +---------+---------------+---------+-----------+----------+--------------+ SFJ      Full                                                        +---------+---------------+---------+-----------+----------+--------------+ FV Prox  Full           Yes      Yes                                 +---------+---------------+---------+-----------+----------+--------------+ FV Mid   Full           Yes      Yes                                 +---------+---------------+---------+-----------+----------+--------------+ FV DistalFull           Yes      Yes                                 +---------+---------------+---------+-----------+----------+--------------+ PFV      Full                                                         +---------+---------------+---------+-----------+----------+--------------+ POP      Full           Yes      Yes                                 +---------+---------------+---------+-----------+----------+--------------+ PTV  Full                                                        +---------+---------------+---------+-----------+----------+--------------+ PERO     Full                                                        +---------+---------------+---------+-----------+----------+--------------+     Summary: BILATERAL: - No evidence of deep vein thrombosis seen in the lower extremities, bilaterally. -No evidence of popliteal cyst, bilaterally.   *See table(s) above for measurements and observations. Electronically signed by Fonda Rim on 01/17/2024 at 7:53:09 AM.    Final    DG Chest Port 1 View Result Date: 01/15/2024 EXAM: 1 VIEW(S) XRAY OF THE CHEST 01/15/2024 04:26:00 PM COMPARISON: Comparison with 07/31/2023. CLINICAL HISTORY: [id number] S/P thoracentesis [id number] S/P thoracentesis FINDINGS: LINES, TUBES AND DEVICES: There is a power port type central venous catheter with the tip over the low SVC region. LUNGS AND PLEURA: Shallow inspiration. Small left pleural effusion with basilar atelectasis or infiltration. No pneumothorax. The right lung is clear. HEART AND MEDIASTINUM: Heart size and pulmonary vascularity are normal. BONES AND SOFT TISSUES: Surgical clips in the left upper quadrant. No acute osseous abnormality. IMPRESSION: 1. No pneumothorax. 2. Small left pleural effusion with basilar atelectasis or infiltration. Electronically signed by: Elsie Gravely MD 01/15/2024 04:36 PM EST RP Workstation: [id number]   IR THORACENTESIS ASP PLEURAL SPACE W/IMG GUIDE Result Date: 01/15/2024 INDICATION: Patient with history of asthma, Pickwick disease, DVT on Xarelto , systemic mastocytosis, pneumonia, recent pulmonary emboli, left pleural effusion; request received for diagnostic  and therapeutic left thoracentesis. EXAM: ULTRASOUND GUIDED DIAGNOSTIC AND THERAPEUTIC LEFT THORACENTESIS MEDICATIONS: 8 mL 1% lidocaine  with epinephrine  to skin/subcutaneous tissue COMPLICATIONS: None immediate. PROCEDURE: An ultrasound guided thoracentesis was thoroughly discussed with the patient and questions answered. The benefits, risks, alternatives and complications were also discussed. The patient understands and wishes to proceed with the procedure. Written consent was obtained. Ultrasound was performed to localize and mark an adequate pocket of fluid in the left chest. The area was then prepped and draped in the normal sterile fashion. 1% Lidocaine  was used for local anesthesia. Under ultrasound guidance a 6 Fr Safe-T-Centesis catheter was introduced. Thoracentesis was performed. The catheter was removed and a dressing applied. FINDINGS: A total of approximately 360 cc of turbid, light yellow fluid was removed. Samples were sent to the laboratory as requested by the clinical team. IMPRESSION: Successful ultrasound guided diagnostic and therapeutic left thoracentesis yielding 360 cc of pleural fluid. Performed by: Franky Rakers, PA-C Electronically Signed   By: Ester Sides M.D.   On: 01/15/2024 16:15     Medical Consultants:   None.   Subjective:    Megan Weber abdominal pain is improved worse with inspiration now having watery stools.  Objective:    Vitals:   01/17/24 0200 01/17/24 0413 01/17/24 0522 01/17/24 0616  BP:  (!) 151/72  133/79  Pulse:  (!) 117 (!) 113 (!) 119  Resp:  17  17  Temp: 99 F (37.2 C) 99.3 F (37.4 C)  98.9 F (37.2 C)  TempSrc: Oral Oral  Oral  SpO2:  100%  99%  Weight:      Height:       SpO2: 99 % O2 Flow Rate (L/min): 2 L/min   Intake/Output Summary (Last 24 hours) at 01/17/2024 0902 Last data filed at 01/17/2024 0800 Gross per 24 hour  Intake 2517.82 ml  Output 1250 ml  Net 1267.82 ml   Filed Weights   01/11/24 1008  Weight: 75.3  kg    Exam: General exam: In no acute distress. Respiratory system: Good air movement and clear to auscultation. Cardiovascular system: S1 & S2 heard, RRR. No JVD. Gastrointestinal system: Abdomen is nondistended, soft and nontender.  Extremities: No pedal edema. Skin: No rashes, lesions or ulcers Psychiatry: Judgement and insight appear normal. Mood & affect appropriate. Data Reviewed:    Labs: Basic Metabolic Panel: Recent Labs  Lab 01/11/24 2109 01/12/24 0554 01/13/24 0618 01/14/24 0509 01/15/24 0743 01/16/24 0435  NA 135 139 138 140 140 139  K 3.4* 3.4* 3.2* 3.9 3.4* 3.8  CL 103 107 103 112* 107 107  CO2 16* 21* 24 21* 22 22  GLUCOSE 292* 203* 252* 103* 124* 107*  BUN 9 5* 7 7 <5* <5*  CREATININE 0.89 0.82 0.67 0.73 0.66 0.61  CALCIUM  8.8* 8.6* 8.5* 7.7* 8.6* 8.6*  MG 1.7  --  1.6* 1.6*  --   --   PHOS 1.9*  --  3.9  --  3.4 3.0   GFR Estimated Creatinine Clearance: 81.6 mL/min (by C-G formula based on SCr of 0.61 mg/dL). Liver Function Tests: Recent Labs  Lab 01/11/24 1054 01/11/24 2109 01/12/24 0554 01/13/24 0618 01/15/24 0743 01/15/24 1106 01/16/24 0435  AST 36 34 29 17  --  15  --   ALT 18 16 13 13   --  10  --   ALKPHOS 157* 155* 140* 147*  --  90  --   BILITOT 0.7 0.8 0.7 0.5  --  0.3  --   PROT 7.2 6.5 5.7* 5.8*  --  5.2*  --   ALBUMIN  3.7 3.3* 2.9* 2.9* 2.9* 2.7* 2.9*   Recent Labs  Lab 01/11/24 1054 01/13/24 0618 01/14/24 0509  LIPASE 151* 191* 132*   No results for input(s): AMMONIA in the last 168 hours. Coagulation profile Recent Labs  Lab 01/11/24 1054  INR 2.1*   COVID-19 Labs  Recent Labs    01/15/24 1106  LDH 120     Lab Results  Component Value Date   SARSCOV2NAA NEGATIVE 01/11/2024    CBC: Recent Labs  Lab 01/11/24 1054 01/11/24 1221 01/13/24 0618 01/14/24 0509 01/15/24 0743 01/16/24 0435 01/17/24 0510  WBC 21.0*   < > 17.9* 14.1* 18.6* 16.8* 13.9*  NEUTROABS 16.7*  --   --   --   --   --   --   HGB  12.8   < > 11.1* 8.9* 10.2* 10.3* 10.1*  HCT 37.3   < > 33.3* 27.1* 30.4* 31.3* 30.2*  MCV 85.9   < > 86.7 90.6 89.1 90.7 88.6  PLT 325  325   < > 407* 363 489* 535* 595*   < > = values in this interval not displayed.   Cardiac Enzymes: No results for input(s): CKTOTAL, CKMB, CKMBINDEX, TROPONINI in the last 168 hours. BNP (last 3 results) No results for input(s): PROBNP in the last 8760 hours. CBG: Recent Labs  Lab 01/16/24 1532 01/16/24 2007 01/16/24 2321 01/17/24 0411 01/17/24 0729  GLUCAP 135* 254* 90 164* 153*   D-Dimer: No results  for input(s): DDIMER in the last 72 hours.  Hgb A1c: No results for input(s): HGBA1C in the last 72 hours.  Lipid Profile: No results for input(s): CHOL, HDL, LDLCALC, TRIG, CHOLHDL, LDLDIRECT in the last 72 hours.  Thyroid function studies: No results for input(s): TSH, T4TOTAL, T3FREE, THYROIDAB in the last 72 hours.  Invalid input(s): FREET3 Anemia work up: No results for input(s): VITAMINB12, FOLATE, FERRITIN, TIBC, IRON, RETICCTPCT in the last 72 hours.  Sepsis Labs: Recent Labs  Lab 01/11/24 2109 01/11/24 2232 01/12/24 0332 01/12/24 0554 01/13/24 0618 01/14/24 0509 01/15/24 0743 01/16/24 0435 01/17/24 0510  PROCALCITON 0.36  --   --   --   --   --   --   --   --   WBC  --   --   --  21.0*   < > 14.1* 18.6* 16.8* 13.9*  LATICACIDVEN 3.0* 3.6* 2.2* 3.0*  --   --   --   --   --    < > = values in this interval not displayed.   Microbiology Recent Results (from the past 240 hours)  Resp panel by RT-PCR (RSV, Flu A&B, Covid) Anterior Nasal Swab     Status: None   Collection Time: 01/11/24 10:14 AM   Specimen: Anterior Nasal Swab  Result Value Ref Range Status   SARS Coronavirus 2 by RT PCR NEGATIVE NEGATIVE Final    Comment: (NOTE) SARS-CoV-2 target nucleic acids are NOT DETECTED.  The SARS-CoV-2 RNA is generally detectable in upper respiratory specimens during the acute  phase of infection. The lowest concentration of SARS-CoV-2 viral copies this assay can detect is 138 copies/mL. A negative result does not preclude SARS-Cov-2 infection and should not be used as the sole basis for treatment or other patient management decisions. A negative result may occur with  improper specimen collection/handling, submission of specimen other than nasopharyngeal swab, presence of viral mutation(s) within the areas targeted by this assay, and inadequate number of viral copies(<138 copies/mL). A negative result must be combined with clinical observations, patient history, and epidemiological information. The expected result is Negative.  Fact Sheet for Patients:  bloggercourse.com  Fact Sheet for Healthcare Providers:  seriousbroker.it  This test is no t yet approved or cleared by the [place]  FDA and  has been authorized for detection and/or diagnosis of SARS-CoV-2 by FDA under an Emergency Use Authorization (EUA). This EUA will remain  in effect (meaning this test can be used) for the duration of the COVID-19 declaration under Section 564(b)(1) of the Act, 21 U.S.C.section 360bbb-3(b)(1), unless the authorization is terminated  or revoked sooner.       Influenza A by PCR NEGATIVE NEGATIVE Final   Influenza B by PCR NEGATIVE NEGATIVE Final    Comment: (NOTE) The Xpert Xpress SARS-CoV-2/FLU/RSV plus assay is intended as an aid in the diagnosis of influenza from Nasopharyngeal swab specimens and should not be used as a sole basis for treatment. Nasal washings and aspirates are unacceptable for Xpert Xpress SARS-CoV-2/FLU/RSV testing.  Fact Sheet for Patients: bloggercourse.com  Fact Sheet for Healthcare Providers: seriousbroker.it  This test is not yet approved or cleared by the [place]  FDA and has been authorized for detection and/or diagnosis of  SARS-CoV-2 by FDA under an Emergency Use Authorization (EUA). This EUA will remain in effect (meaning this test can be used) for the duration of the COVID-19 declaration under Section 564(b)(1) of the Act, 21 U.S.C. section 360bbb-3(b)(1), unless the authorization is terminated or revoked.  Resp Syncytial Virus by PCR NEGATIVE NEGATIVE Final    Comment: (NOTE) Fact Sheet for Patients: bloggercourse.com  Fact Sheet for Healthcare Providers: seriousbroker.it  This test is not yet approved or cleared by the [place]  FDA and has been authorized for detection and/or diagnosis of SARS-CoV-2 by FDA under an Emergency Use Authorization (EUA). This EUA will remain in effect (meaning this test can be used) for the duration of the COVID-19 declaration under Section 564(b)(1) of the Act, 21 U.S.C. section 360bbb-3(b)(1), unless the authorization is terminated or revoked.  Performed at [facility], [address] [place], [place] [address]   Culture, blood (routine x 2)     Status: None   Collection Time: 01/11/24 10:32 AM   Specimen: BLOOD  Result Value Ref Range Status   Specimen Description   Final    BLOOD LEFT ANTECUBITAL Performed at [facility], [address] [place] [address] [place], [place] [address]    Special Requests   Final    BOTTLES DRAWN AEROBIC AND ANAEROBIC Blood Culture adequate volume Performed at [facility], [address] [place], [place] [address]    Culture   Final    NO GROWTH 5 DAYS Performed at [facility], [address] [place], [place] [address]    Report Status 01/16/2024 FINAL  Final  MRSA Next Gen by PCR, Nasal     Status: None   Collection Time: 01/11/24  7:01 PM   Specimen: Nasal Mucosa; Nasal Swab  Result Value Ref Range Status   MRSA by PCR Next Gen NOT DETECTED NOT DETECTED Final    Comment: (NOTE) The GeneXpert MRSA Assay (FDA approved for NASAL  specimens only), is one component of a comprehensive MRSA colonization surveillance program. It is not intended to diagnose MRSA infection nor to guide or monitor treatment for MRSA infections. Test performance is not FDA approved in patients less than 68 years old. Performed at [facility], [address], [place], [place] [address]   Culture, blood (routine x 2) Call MD if unable to obtain prior to antibiotics being given     Status: None   Collection Time: 01/11/24  9:09 PM   Specimen: BLOOD LEFT HAND  Result Value Ref Range Status   Specimen Description   Final    BLOOD LEFT HAND Performed at [facility], [address] [place], [place] [address]    Special Requests   Final    BOTTLES DRAWN AEROBIC ONLY Blood Culture results may not be optimal due to an inadequate volume of blood received in culture bottles Performed at [facility], [address] [place], [place] [address]    Culture   Final    NO GROWTH 5 DAYS Performed at [facility], [address], [place], [place] [address]    Report Status 01/16/2024 FINAL  Final  Culture, blood (routine x 2) Call MD if unable to obtain prior to antibiotics being given     Status: None   Collection Time: 01/11/24  9:09 PM   Specimen: BLOOD LEFT HAND  Result Value Ref Range Status   Specimen Description   Final    BLOOD LEFT HAND Performed at [facility], [address] [place], [place] [address]    Special Requests   Final    BOTTLES DRAWN AEROBIC ONLY Blood Culture adequate volume Performed at [facility], [address]  [place], [place] [address]    Culture   Final    NO GROWTH 5 DAYS Performed at [facility], [address], [place], [place] [address]    Report Status 01/16/2024 FINAL  Final     Medications:    Chlorhexidine  Gluconate Cloth  6 each Topical Daily   cyanocobalamin   1,000 mcg Oral Daily   DULoxetine   120 mg Oral  Daily   folic acid   1 mg Oral Daily   gabapentin   400 mg Oral TID   hydroxychloroquine   300 mg Oral Daily   insulin  aspart  0-15 Units Subcutaneous Q4H   insulin  glargine-yfgn  7 Units Subcutaneous Daily   loratadine   10 mg Oral Daily   metoprolol  tartrate  50 mg Oral BID   montelukast   10 mg Oral QHS   pantoprazole   40 mg Oral Daily   Continuous Infusions:  ampicillin -sulbactam (UNASYN ) IV 3 g (01/17/24 0823)   doxycycline  (VIBRAMYCIN ) IV 100 mg (01/17/24 0436)   famotidine  (PEPCID ) IV 20 mg (01/16/24 2109)   heparin  1,650 Units/hr (01/17/24 9385)   promethazine  (PHENERGAN ) injection (IM or IVPB) Stopped (01/13/24 1231)      LOS: 6 days   Erle Odell Castor  [facility]  01/17/2024, 9:02 AM

## 2024-01-17 NOTE — Progress Notes (Signed)
 PHARMACY - ANTICOAGULATION CONSULT NOTE  Pharmacy Consult for Heparin  Indication: pulmonary embolus  Allergies[1]  Patient Measurements: Height: 5' 1 (154.9 cm) Weight: 75.3 kg (166 lb) IBW/kg (Calculated) : 47.8 HEPARIN  DW (KG): 64.4  Vital Signs: Temp: 99.3 F (37.4 C) (12/18 0413) Temp Source: Oral (12/18 0413) BP: 151/72 (12/18 0413) Pulse Rate: 113 (12/18 0522)  Labs: Recent Labs    01/15/24 0514 01/15/24 0743 01/15/24 0743 01/16/24 0435 01/17/24 0510  HGB  --  10.2*   < > 10.3* 10.1*  HCT  --  30.4*  --  31.3* 30.2*  PLT  --  489*  --  535* 595*  HEPARINUNFRC 0.42  --   --  0.38 0.28*  CREATININE  --  0.66  --  0.61  --    < > = values in this interval not displayed.    Estimated Creatinine Clearance: 81.6 mL/min (by C-G formula based on SCr of 0.61 mg/dL).  Medications: Infusions:   ampicillin -sulbactam (UNASYN ) IV 3 g (01/17/24 0145)   doxycycline  (VIBRAMYCIN ) IV 100 mg (01/17/24 0436)   famotidine  (PEPCID ) IV 20 mg (01/16/24 2109)   heparin  1,500 Units/hr (01/17/24 0147)   promethazine  (PHENERGAN ) injection (IM or IVPB) Stopped (01/13/24 1231)     Assessment: 33 yoF presented to ED on 12/12 with CP and vomiting. Found to have bilateral PE's and possible DVT.  Pt is on chronic low dose Xarelto  10mg , but patient reports noncompliance.  Noted PTA medroxyprogesterone injection (last dose 12/31/23) and may increase risk of VTE.  Pharmacy is consulted to dose Heparin  IV.  Today, 01/17/2024:  Heparin  level 0.28, now sub-therapeutic on heparin  1500 units/hr.  CBC: Hgb low/stable at 10.1, Plt elevated No bleeding or complications reported by RN (s/p thoracentesis on 12/16)  Goal of Therapy:  Heparin  level 0.3-0.7 units/ml Monitor platelets by anticoagulation protocol: Yes   Plan:  Increase IV heparin  to 1650 units/hr Repeat heparin  level in 6h Daily heparin  level and CBC Follow up plan for transition to oral anticoagulation.  Rosaline Millet, PharmD,  BCPS 01/17/2024 5:52 AM           [1]  Allergies Allergen Reactions   Ceftin [Cefuroxime Axetil] Shortness Of Breath and Swelling    Tolerates ceftriaxone , penicillins and cephalexin     Cinobac [Cinoxacin] Shortness Of Breath and Swelling    Has received Levaquin  in inpt & outpt setting   Fire Ant Anaphylaxis   Sulfa Antibiotics Other (See Comments)    Unknown

## 2024-01-17 NOTE — Progress Notes (Signed)
 PHARMACY - ANTICOAGULATION CONSULT NOTE  Pharmacy Consult for Heparin  Indication: pulmonary embolus  Allergies[1]  Patient Measurements: Height: 5' 1 (154.9 cm) Weight: 75.3 kg (166 lb) IBW/kg (Calculated) : 47.8 HEPARIN  DW (KG): 64.4  Vital Signs: Temp: 98.9 F (37.2 C) (12/18 0616) Temp Source: Oral (12/18 0616) BP: 133/79 (12/18 0616) Pulse Rate: 119 (12/18 0616)  Labs: Recent Labs    01/15/24 0514 01/15/24 0743 01/15/24 0743 01/16/24 0435 01/17/24 0510  HGB  --  10.2*   < > 10.3* 10.1*  HCT  --  30.4*  --  31.3* 30.2*  PLT  --  489*  --  535* 595*  HEPARINUNFRC 0.42  --   --  0.38 0.28*  CREATININE  --  0.66  --  0.61  --    < > = values in this interval not displayed.    Estimated Creatinine Clearance: 81.6 mL/min (by C-G formula based on SCr of 0.61 mg/dL).  Medications: Infusions:   ampicillin -sulbactam (UNASYN ) IV 3 g (01/17/24 0823)   doxycycline  (VIBRAMYCIN ) IV 100 mg (01/17/24 0436)   famotidine  (PEPCID ) IV 20 mg (01/16/24 2109)   promethazine  (PHENERGAN ) injection (IM or IVPB) Stopped (01/13/24 1231)     Assessment: 89 yoF presented to ED on 12/12 with CP and vomiting. Found to have bilateral PE's and possible DVT.  Pt is on chronic low dose Xarelto  10mg , but patient reports noncompliance. Noted PTA medroxyprogesterone injection (last dose 12/31/23) and may increase risk of VTE. Pharmacy initially consulted to dose IV Heparin  but now consulted to transition to Xarelto .  Today, 01/17/2024:  AM heparin  level 0.28, slightly sub-therapeutic on heparin  1500 units/hr >> was subsequently increased to 1650 units/hr CBC: Hgb low/stable at 10.1, Plt elevated No bleeding or complications reported by RN (s/p thoracentesis on 12/16) Patient has received 7 days of therapeutic heparin , however, given 21-day loading dose of Xarelto  for VTE treatment and lack of patient compliance with Xarelto  10mg  PTA, will proceed with the Xarelto  dose pack for continued treatment and  ease of patient compliance outpatient.  Goal of Therapy:  Heparin  level 0.3-0.7 units/ml Monitor platelets by anticoagulation protocol: Yes   Plan:  Stop heparin  Start Xarelto  15mg  BID with food x21 days, followed by 20mg  daily with food thereafter Discontinue patient's Xarelto  10mg  tablet at discharge Continue to monitor for s/sx of bleeding    Lacinda Moats, PharmD Clinical Pharmacist  12/18/20259:45 AM    [1]  Allergies Allergen Reactions   Ceftin [Cefuroxime Axetil] Shortness Of Breath and Swelling    Tolerates ceftriaxone , penicillins and cephalexin     Cinobac [Cinoxacin] Shortness Of Breath and Swelling    Has received Levaquin  in inpt & outpt setting   Fire Ant Anaphylaxis   Sulfa Antibiotics Other (See Comments)    Unknown

## 2024-01-17 NOTE — Progress Notes (Signed)
 NAME:  Megan Weber, MRN:  [id number], DOB:  28-Jan-1976, LOS: 6 ADMISSION DATE:  01/11/2024, CONSULTATION DATE:  01/17/2024 REFERRING MD:  [facility], CHIEF COMPLAINT:  medical complexity?   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 PCCM consulted for evaluation and management.  Patient reports feeling poorly for the past few days with nausea, vomiting, fevers, chills, body aches, chest tightness, and coughing. She was found to have pneumonia. 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 She has not had pneumonia previously.  Pertinent  Medical History   Past Medical History:  Diagnosis Date   Abnormal glucose    Adjustment disorder    Asplenia    Cerebral aneurysm    Cerebral degeneration associated with generalized lipidosis    Depression    Diabetes mellitus without complication (HCC)    Hypertension    OCD (obsessive compulsive disorder)    Peripheral neuropathy    Significant Hospital Events: Including procedures, antibiotic start and stop dates in addition to other pertinent events   12/13 --> admitted from [facility] to [facility] in [facility] stepdown 12/18> reconsulted as thoracentesis done on 12/16 showed elevated white cell count.  Interim History / Subjective:  Still experiencing nausea, vomiting, and abdominal pain.  Objective    Blood pressure 133/79, pulse (!) 119, temperature 98.9 F (37.2 C), temperature source Oral, resp. rate 17, height 5' 1 (1.549 m), weight 75.3 kg, SpO2 99%.        Intake/Output Summary (Last 24 hours) at 01/17/2024 0958 Last data filed at 01/17/2024  0800 Gross per 24 hour  Intake 2517.82 ml  Output 1250 ml  Net 1267.82 ml   Filed Weights   01/11/24 1008  Weight: 75.3 kg    Examination: General: mid aged lady in distress.  Lungs: clear to auscultation bilaterally.  Heart: regular rate rhythm, no murmur appreciated.  Abdomen: non tender, non distended. Normal BS.  Neuro: axox 3.  Moving all extremities.  CT chest 12/12 reviewed by me: On admission showed bilateral PE with some air in distal left main PA but mostly in lower lobar artery.  Also seen in segmental right lower lobe.  There is streaky opacity in left lower lobe with some ground glass changes in right upper lobe likely pneumonia.  Otherwise parenchyma is unremarkable.  No pleural effusion seen. CT abdomen 12/16: New left-sided small pleural effusion with associated left lower lobe opacity concerning for pneumonia.  Chest x-ray 12/18 shows left retrocardiac opacity with possible left effusion/infiltrate.  POCUS 12/18 [images saved in media] trace-small left effusion with associated consolidation.  Window available to do thoracentesis if needed but suboptimal window.  Resolved problem list   Assessment and Plan   #Low risk PE: - Echo with low normal RV function.  No RV dilation. - Troponins mildly elevated.  BNP and echo ordered. - Will hold off on further investigation as patient is on room air and hemodynamically stable. - Outpatient follow-up echo - On heparin  drip.  Okay to transition to oral agents from pulmonary standpoint.  #Community Acquired Pneumonia: # Left parapneumonic effusion # Hypogammaglobinemia -Status post thoracentesis by IR on 12/16.  Unfortunately, cultures,  protein, LDH on fluid was not sent.  Unclear if cytology was sent. -ID following.  ID workup negative so far. -Antibiotics per ID. -Left effusion is mostly parapneumonic effusion and less likely to be empyema or complicated parapneumonic effusion.  In addition most of the fluid has been  drained.  There is a trace to small effusion seen on POCUS.  Considering the patient is improving markedly without any fever, improving white cell count and hemodynamically stable, I do not believe it would be worthwhile doing a thoracentesis and this relatively challenging thoracentesis with low utility of repeat thoracentesis for cultures.  I will consider thoracentesis if requested by ID for diagnostic purposes.  Hence from pulmonary standpoint okay to transition to oral AC. -Antibiotics per ID.  Currently on Unasyn  and doxycycline  to cover CAP.  PCCM will sign off.  Please reach out to us  if worsening clinically or if ID request repeat thoracentesis.   Patient Lines/Drains/Airways Status     Active Line/Drains/Airways     Name Placement date Placement time Site Days   Implanted Port 01/11/24 Right Chest Single Power 01/11/24  1429  Chest  6   Peripheral IV 01/11/24 22 G 1.75 Anterior;Left Forearm 01/11/24  2312  Forearm  6   Peripheral IV 01/11/24 20 G 1.88 Anterior;Right Forearm 01/11/24  2313  Forearm  6            Labs   CBC: Recent Labs  Lab 01/11/24 1054 01/11/24 1221 01/13/24 0618 01/14/24 0509 01/15/24 0743 01/16/24 0435 01/17/24 0510  WBC 21.0*   < > 17.9* 14.1* 18.6* 16.8* 13.9*  NEUTROABS 16.7*  --   --   --   --   --   --   HGB 12.8   < > 11.1* 8.9* 10.2* 10.3* 10.1*  HCT 37.3   < > 33.3* 27.1* 30.4* 31.3* 30.2*  MCV 85.9   < > 86.7 90.6 89.1 90.7 88.6  PLT 325  325   < > 407* 363 489* 535* 595*   < > = values in this interval not displayed.    Basic Metabolic Panel: Recent Labs  Lab 01/11/24 2109 01/12/24 0554 01/13/24 0618 01/14/24 0509 01/15/24 0743 01/16/24 0435  NA 135 139 138 140 140 139  K 3.4* 3.4* 3.2* 3.9 3.4* 3.8  CL 103 107 103 112* 107 107  CO2 16* 21* 24 21* 22 22  GLUCOSE 292* 203* 252* 103* 124* 107*  BUN 9 5* 7 7 <5* <5*  CREATININE 0.89 0.82 0.67 0.73 0.66 0.61  CALCIUM  8.8* 8.6* 8.5* 7.7* 8.6* 8.6*  MG 1.7  --  1.6* 1.6*   --   --   PHOS 1.9*  --  3.9  --  3.4 3.0   GFR: Estimated Creatinine Clearance: 81.6 mL/min (by C-G formula based on SCr of 0.61 mg/dL). Recent Labs  Lab 01/11/24 2109 01/11/24 2232 01/12/24 0332 01/12/24 0554 01/13/24 0618 01/14/24 0509 01/15/24 0743 01/16/24 0435 01/17/24 0510  PROCALCITON 0.36  --   --   --   --   --   --   --   --   WBC  --   --   --  21.0*   < > 14.1* 18.6* 16.8* 13.9*  LATICACIDVEN 3.0* 3.6* 2.2* 3.0*  --   --   --   --   --    < > = values in this interval not displayed.    Liver Function Tests: Recent Labs  Lab 01/11/24 1054  01/11/24 2109 01/12/24 0554 01/13/24 0618 01/15/24 0743 01/15/24 1106 01/16/24 0435  AST 36 34 29 17  --  15  --   ALT 18 16 13 13   --  10  --   ALKPHOS 157* 155* 140* 147*  --  90  --   BILITOT 0.7 0.8 0.7 0.5  --  0.3  --   PROT 7.2 6.5 5.7* 5.8*  --  5.2*  --   ALBUMIN  3.7 3.3* 2.9* 2.9* 2.9* 2.7* 2.9*   Recent Labs  Lab 01/11/24 1054 01/13/24 0618 01/14/24 0509  LIPASE 151* 191* 132*   No results for input(s): AMMONIA in the last 168 hours.  ABG    Component Value Date/Time   PHART 7.43 01/11/2024 2305   PCO2ART 28 (L) 01/11/2024 2305   PO2ART 81 (L) 01/11/2024 2305   HCO3 18.6 (L) 01/11/2024 2305   TCO2 20 (L) 01/11/2024 1221   ACIDBASEDEF 4.6 (H) 01/11/2024 2305   O2SAT 98.3 01/11/2024 2305     Coagulation Profile: Recent Labs  Lab 01/11/24 1054  INR 2.1*    Cardiac Enzymes: No results for input(s): CKTOTAL, CKMB, CKMBINDEX, TROPONINI in the last 168 hours.  HbA1C: Hgb A1c MFr Bld  Date/Time Value Ref Range Status  01/11/2024 09:09 PM 10.6 (H) 4.8 - 5.6 % Final    Comment:    (NOTE) Diagnosis of Diabetes The following HbA1c ranges recommended by the American Diabetes Association (ADA) may be used as an aid in the diagnosis of diabetes mellitus.  Hemoglobin             Suggested A1C NGSP%              Diagnosis  <5.7                   Non Diabetic  5.7-6.4                 Pre-Diabetic  >6.4                   Diabetic  <7.0                   Glycemic control for                       adults with diabetes.      CBG: Recent Labs  Lab 01/16/24 1532 01/16/24 2007 01/16/24 2321 01/17/24 0411 01/17/24 0729  GLUCAP 135* 254* 90 164* 153*    Review of Systems:   Not obtained.  Past Medical History:  She,  has a past medical history of Abnormal glucose, Adjustment disorder, Asplenia, Cerebral aneurysm, Cerebral degeneration associated with generalized lipidosis, Depression, Diabetes mellitus without complication (HCC), Hypertension, OCD (obsessive compulsive disorder), and Peripheral neuropathy.   Surgical History:   Past Surgical History:  Procedure Laterality Date   IR THORACENTESIS ASP PLEURAL SPACE W/IMG GUIDE  01/15/2024   SPLENECTOMY     SPLENECTOMY       Social History:   reports that she has never smoked. She has never used smokeless tobacco. She reports that she does not drink alcohol and does not use drugs.   Family History:  Her family history includes Diabetes Mellitus II in her father.   Allergies Allergies[1]   Home Medications  Prior to Admission medications  Medication Sig Start Date End Date Taking? Authorizing Provider  ABILIFY 5 MG tablet Take 5 mg by mouth daily. 06/26/23  Yes [provider]  acetaminophen  (TYLENOL ) 325 MG tablet Take 325 mg by mouth every 6 (six) hours as needed for mild pain (pain score 1-3) or moderate pain (pain score 4-6). 01/07/24  Yes [provider]  albuterol  (VENTOLIN  HFA) 108 (90 Base) MCG/ACT inhaler INHALE 1 PUFF EVERY 4 HOURS as NEEDED; Duration: 30 days 10/23/23  Yes [provider]  atorvastatin  (LIPITOR) 40 MG tablet Take 40 mg by mouth at bedtime. 03/02/17  Yes [provider]  azelastine (ASTELIN) 0.1 % nasal spray INSTILL ONE SPRAY INTO BOTH NOSTRILS TWICE DAILY AS DIRECTED 12/06/21  Yes [provider]  calcium -vitamin D  (OSCAL WITH D) 500-200  MG-UNIT tablet Take 1 tablet by mouth 2 (two) times daily.   Yes [provider]  citalopram  (CELEXA ) 40 MG tablet Take 40 mg by mouth daily after breakfast.    Yes [provider]  clonazePAM  (KLONOPIN ) 0.5 MG tablet Take 0.5 mg by mouth 3 (three) times daily as needed for anxiety.    Yes [provider]  Cranberry 500 MG CAPS Take 500 mg by mouth at bedtime.   Yes [provider]  DULoxetine  (CYMBALTA ) 60 MG capsule Take 120 mg by mouth daily. 12/07/20  Yes [provider]  EPINEPHrine  (EPIPEN  2-PAK) 0.3 mg/0.3 mL IJ SOAJ injection Inject 0.3 mg into the muscle daily as needed (allergic reaction).   Yes [provider]  estradiol (ESTRACE) 0.1 MG/GM vaginal cream Place 1 Applicatorful vaginally 2 (two) times a week. 03/25/21  Yes [provider]  ferrous sulfate  325 (65 FE) MG tablet Take 325 mg by mouth daily with breakfast.   Yes [provider]  fluconazole (DIFLUCAN) 200 MG tablet Take 200 mg by mouth daily.   Yes [provider]  fluticasone (FLONASE) 50 MCG/ACT nasal spray Place 2 sprays into the nose daily as needed for allergies.    Yes [provider]  folic acid  (FOLVITE ) 1 MG tablet Take 1 mg by mouth daily after breakfast.    Yes [provider]  gabapentin  (NEURONTIN ) 800 MG tablet Take 800 mg by mouth 3 (three) times daily.   Yes [provider]  GERI-DRYL 25 MG tablet Take 25 mg by mouth as directed.   Yes [provider]  glucagon Emergency 1 MG SOLR Inject as directed at bedtime as needed   Yes [provider]  Heparin  Na, Pork, Lock Flsh PF 100 UNIT/ML SOLN SMARTSIG:5 Milliliter(s) PRN 01/07/24  Yes [provider]  Hydroxychloroquine  Sulfate 100 MG TABS Take 300 mg by mouth daily.   Yes [provider]  hydrOXYzine (ATARAX) 25 MG tablet Take 25 mg by mouth every 8 (eight) hours as needed for anxiety. 02/06/22  Yes [provider]   loratadine  (CLARITIN ) 10 MG tablet Take 10 mg by mouth daily.   Yes [provider]  meclizine (ANTIVERT) 25 MG tablet Take 25-50 mg by mouth as needed for Dizziness   Yes [provider]  medroxyPROGESTERone Acetate 150 MG/ML SUSY Inject 150 mg as directed every 3 (three) months. 03/31/17  Yes [provider]  metFORMIN (GLUCOPHAGE) 1000 MG tablet Take 1,000 mg by mouth 2 (two) times daily. 03/02/17  Yes [provider]  metoprolol  tartrate (LOPRESSOR ) 25 MG tablet Take 12.5 mg by mouth 2 (two) times daily. 03/02/17  Yes [provider]  Multiple Vitamin (MULTI-VITAMIN) tablet Take 2 tablets by mouth daily.   Yes [provider]  naloxone (NARCAN) nasal spray 4 mg/0.1 mL INSTILL ONE SPRAY INTO NOSTRIL  AT SIGNS OF OVERDOSE   Yes [provider]  OCTAGAM 30 GM/300ML SOLN INFUSE 30g INTRAVENOUSLY EVERY 4 WEEKS   Yes [provider]  omeprazole (PRILOSEC) 20 MG capsule Take 20 mg by mouth at bedtime.  03/02/17  Yes [provider]  ondansetron  (ZOFRAN ) 8 MG tablet Take 8 mg by mouth every 8 (eight) hours as needed for nausea or vomiting.   Yes [provider]  ondansetron  (ZOFRAN -ODT) 8 MG disintegrating tablet Take 8 mg by mouth every 8 (eight) hours as needed for nausea or vomiting.   Yes [provider]  Oxycodone  HCl 10 MG TABS Take 10 mg by mouth 4 (four) times daily as needed (Pain).   Yes [provider]  OZEMPIC, 2 MG/DOSE, 8 MG/3ML SOPN INJECT 2MG  UNDER THE SKIN ONCE WEEKLY Subcutaneous weekly; Duration: 30 days 03/09/22  Yes [provider]  SINGULAIR  10 MG tablet Take 10 mg by mouth at bedtime. 08/30/20  Yes [provider]  tiZANidine  (ZANAFLEX ) 4 MG tablet Take 4 mg by mouth every 6 (six) hours as needed for muscle spasms.  03/02/17  Yes [provider]  vitamin B-12 (CYANOCOBALAMIN ) 1000 MCG tablet Take 1,000 mcg by mouth daily after breakfast.    Yes [provider]  vitamin C  (ASCORBIC ACID ) 500 MG tablet Take 500 mg by mouth daily after breakfast.    Yes [provider]  XARELTO  10 MG TABS tablet Take 10 mg by mouth daily. 11/27/19  Yes [provider]  XENPOZYME 20 MG SOLR as directed Intravenous 10/09/23  Yes [provider]  XENPOZYME 4 MG SOLR Inject into the vein.   Yes [provider]  amLODipine (NORVASC) 5 MG tablet Take 5 mg by mouth daily. Patient not taking: Reported on 01/12/2024 11/15/23   [provider]  desipramine  (NOPRAMIN) 10 MG tablet Take 10 mg by mouth at bedtime.  Patient not taking: Reported on 01/12/2024 03/30/17   [provider]  dicyclomine  (BENTYL ) 10 MG capsule Take 1 capsule (10 mg total) by mouth 3 (three) times daily before meals for 5 days. Patient not taking: Reported on 01/12/2024 04/10/17 04/15/17  Jillian Buttery, MD  levofloxacin  (LEVAQUIN ) 750 MG tablet Take 1 tablet (750 mg total) by mouth daily. Patient not taking: Reported on 01/12/2024 04/11/17   Jillian Buttery, MD  lidocaine -prilocaine (EMLA) cream Apply 1 Application topically as needed [facility] access).    [provider]  lisinopril  (PRINIVIL ,ZESTRIL ) 2.5 MG tablet Take 2.5 mg by mouth at bedtime.  Patient not taking: Reported on 01/12/2024 03/02/17   [provider]  meloxicam (MOBIC) 7.5 MG tablet Take 7.5 mg by mouth daily as needed for pain.  Patient not taking: Reported on 01/12/2024 03/02/17   [provider]  methylPREDNISolone (MEDROL) 8 MG tablet Take 80 mg by mouth as directed. On days she has infusions    [provider]  nitrofurantoin, macrocrystal-monohydrate, (MACROBID) 100 MG capsule Take 100 mg by mouth 2 (two) times daily. Patient not taking: Reported on 01/12/2024 04/05/17   [provider]  pramipexole (MIRAPEX) 0.5 MG tablet 1 tablet Orally Once a day; Duration: 90 days 07/23/23   [provider]    Total care time: 60 minutes    Care time was exclusive of separately billable procedures and treating other patients.  Care was necessary to treat or prevent imminent or life-threatening deterioration.   Care was time spent personally by me on the following activities: development of treatment plan with  patient and/or surrogate as well as nursing, discussions with consultants, evaluation of patient's response to treatment, examination of patient, obtaining history from patient or surrogate, ordering and performing treatments and interventions, ordering and review of laboratory studies, ordering and review of radiographic studies and pulse oximetry.   Sammi JONETTA Fredericks, MD Pulmonary, Critical Care and Sleep Attending.   01/17/2024, 11:42 AM       [1]  Allergies Allergen Reactions   Ceftin [Cefuroxime Axetil] Shortness Of Breath and Swelling    Tolerates ceftriaxone , penicillins and cephalexin     Cinobac [Cinoxacin] Shortness Of Breath and Swelling    Has received Levaquin  in inpt & outpt setting   Fire Ant Anaphylaxis   Sulfa Antibiotics Other (See Comments)    Unknown

## 2024-01-17 NOTE — Discharge Instructions (Addendum)
 Information on my medicine - XARELTO  (rivaroxaban )  WHY WAS XARELTO  PRESCRIBED FOR YOU? Xarelto  was prescribed to treat blood clots that may have been found in the veins of your legs (deep vein thrombosis) or in your lungs (pulmonary embolism) and to reduce the risk of them occurring again.  What do you need to know about Xarelto ? The starting dose is one 15 mg tablet taken TWICE daily with food for the FIRST 21 DAYS then on 1/8 the dose is changed to one 20 mg tablet taken ONCE A DAY with your evening meal.  DO NOT stop taking Xarelto  without talking to the health care provider who prescribed the medication.  Refill your prescription for 20 mg tablets before you run out.  After discharge, you should have regular check-up appointments with your healthcare provider that is prescribing your Xarelto .  In the future your dose may need to be changed if your kidney function changes by a significant amount.  What do you do if you miss a dose? If you are taking Xarelto  TWICE DAILY and you miss a dose, take it as soon as you remember. You may take two 15 mg tablets (total 30 mg) at the same time then resume your regularly scheduled 15 mg twice daily the next day.  If you are taking Xarelto  ONCE DAILY and you miss a dose, take it as soon as you remember on the same day then continue your regularly scheduled once daily regimen the next day. Do not take two doses of Xarelto  at the same time.   Important Safety Information Xarelto  is a blood thinner medicine that can cause bleeding. You should call your healthcare provider right away if you experience any of the following: Bleeding from an injury or your nose that does not stop. Unusual colored urine (red or dark brown) or unusual colored stools (red or black). Unusual bruising for unknown reasons. A serious fall or if you hit your head (even if there is no bleeding).  Some medicines may interact with Xarelto  and might increase your risk of  bleeding while on Xarelto . To help avoid this, consult your healthcare provider or pharmacist prior to using any new prescription or non-prescription medications, including herbals, vitamins, non-steroidal anti-inflammatory drugs (NSAIDs) and supplements.  This website has more information on Xarelto : www.xarelto .com.

## 2024-01-17 NOTE — Progress Notes (Signed)
 Mobility Specialist Progress Note:   01/17/24 1503  Mobility  Activity Ambulated independently  Level of Assistance Independent  Assistive Device None  Distance Ambulated (ft) 350 ft  Activity Response Tolerated well  Mobility Referral Yes  Mobility visit 1 Mobility  Mobility Specialist Start Time (ACUTE ONLY) 1437  Mobility Specialist Stop Time (ACUTE ONLY) 1449  Mobility Specialist Time Calculation (min) (ACUTE ONLY) 12 min   Pt was received in bed and agreed to mobility. Pt stated pain in abdomen, but continued ambulation. Returned to bed with all needs met. Call bell in reach.   [employer] - Mobility Specialist

## 2024-01-18 ENCOUNTER — Other Ambulatory Visit (HOSPITAL_COMMUNITY): Payer: Self-pay

## 2024-01-18 DIAGNOSIS — K859 Acute pancreatitis without necrosis or infection, unspecified: Secondary | ICD-10-CM | POA: Diagnosis not present

## 2024-01-18 DIAGNOSIS — I2699 Other pulmonary embolism without acute cor pulmonale: Secondary | ICD-10-CM | POA: Diagnosis not present

## 2024-01-18 DIAGNOSIS — E1110 Type 2 diabetes mellitus with ketoacidosis without coma: Secondary | ICD-10-CM | POA: Diagnosis not present

## 2024-01-18 DIAGNOSIS — A419 Sepsis, unspecified organism: Secondary | ICD-10-CM | POA: Diagnosis not present

## 2024-01-18 DIAGNOSIS — R6520 Severe sepsis without septic shock: Secondary | ICD-10-CM | POA: Diagnosis not present

## 2024-01-18 LAB — ALBUMIN, FLUID (OTHER): Albumin, Body Fluid Other: 1.5 g/dL

## 2024-01-18 LAB — CBC
HCT: 30.4 % — ABNORMAL LOW (ref 36.0–46.0)
Hemoglobin: 10.1 g/dL — ABNORMAL LOW (ref 12.0–15.0)
MCH: 29.5 pg (ref 26.0–34.0)
MCHC: 33.2 g/dL (ref 30.0–36.0)
MCV: 88.9 fL (ref 80.0–100.0)
Platelets: 665 K/uL — ABNORMAL HIGH (ref 150–400)
RBC: 3.42 MIL/uL — ABNORMAL LOW (ref 3.87–5.11)
RDW: 14.9 % (ref 11.5–15.5)
WBC: 13.7 K/uL — ABNORMAL HIGH (ref 4.0–10.5)
nRBC: 0.3 % — ABNORMAL HIGH (ref 0.0–0.2)

## 2024-01-18 LAB — GLUCOSE, CAPILLARY
Glucose-Capillary: 121 mg/dL — ABNORMAL HIGH (ref 70–99)
Glucose-Capillary: 124 mg/dL — ABNORMAL HIGH (ref 70–99)
Glucose-Capillary: 131 mg/dL — ABNORMAL HIGH (ref 70–99)

## 2024-01-18 LAB — LD, BODY FLUID (OTHER): LD, Body Fluid: 722 IU/L

## 2024-01-18 LAB — PROTEIN, BODY FLUID (OTHER): Total Protein, Body Fluid Other: 2.4 g/dL

## 2024-01-18 MED ORDER — SODIUM CHLORIDE 0.9% FLUSH: 10.0000 mL | INTRAVENOUS | Status: DC | PRN

## 2024-01-18 MED ORDER — PANTOPRAZOLE SODIUM 40 MG PO TBEC
40.0000 mg | DELAYED_RELEASE_TABLET | Freq: Two times a day (BID) | ORAL | 0 refills | Status: AC
  Filled 2024-01-18: qty 30, 15d supply, fill #0

## 2024-01-18 MED ORDER — DOXYCYCLINE HYCLATE 100 MG PO TABS: 100.0000 mg | ORAL_TABLET | Freq: Two times a day (BID) | ORAL | Status: DC

## 2024-01-18 MED ORDER — HEPARIN SOD (PORK) LOCK FLUSH 100 UNIT/ML IV SOLN
500.0000 [IU] | INTRAVENOUS | Status: AC | PRN
  Administered 2024-01-18: 500 [IU]

## 2024-01-18 MED ORDER — DOXYCYCLINE HYCLATE 100 MG PO TABS
100.0000 mg | ORAL_TABLET | Freq: Two times a day (BID) | ORAL | 0 refills | Status: AC
  Filled 2024-01-18: qty 14, 7d supply, fill #0

## 2024-01-18 MED ORDER — METOPROLOL TARTRATE 50 MG PO TABS: 50.0000 mg | ORAL_TABLET | Freq: Two times a day (BID) | ORAL | 0 refills | Status: AC

## 2024-01-18 MED ORDER — AMOXICILLIN-POT CLAVULANATE 875-125 MG PO TABS: 1.0000 | ORAL_TABLET | Freq: Two times a day (BID) | ORAL | Status: DC

## 2024-01-18 MED ORDER — ONDANSETRON 4 MG PO TBDP
4.0000 mg | ORAL_TABLET | Freq: Three times a day (TID) | ORAL | 0 refills | Status: AC | PRN
  Filled 2024-01-18: qty 20, 7d supply, fill #0

## 2024-01-18 MED ORDER — ONDANSETRON 8 MG PO TBDP: 8.0000 mg | ORAL_TABLET | Freq: Three times a day (TID) | ORAL | 0 refills | Status: AC | PRN

## 2024-01-18 MED ORDER — SODIUM CHLORIDE 0.9% FLUSH: 10.0000 mL | Freq: Two times a day (BID) | INTRAVENOUS | Status: DC

## 2024-01-18 MED ORDER — XARELTO 10 MG PO TABS
10.0000 mg | ORAL_TABLET | Freq: Every day | ORAL | 0 refills | Status: AC
  Filled 2024-01-18: qty 30, 30d supply, fill #0

## 2024-01-18 MED ORDER — AMOXICILLIN-POT CLAVULANATE 875-125 MG PO TABS
1.0000 | ORAL_TABLET | Freq: Two times a day (BID) | ORAL | 0 refills | Status: AC
  Filled 2024-01-18: qty 14, 7d supply, fill #0

## 2024-01-18 NOTE — Discharge Summary (Signed)
 Physician Discharge Summary  Megan Weber [name] DOB: 1976/01/22 DOA: 01/11/2024  PCP: Megan Kerney SQUIBB, MD  Admit date: 01/11/2024 Discharge date: 01/18/2024  Admitted From: Home Disposition:  Home  Recommendations for Outpatient Follow-up:  Follow up with PCP in 1-2 weeks Please obtain BMP/CBC in one week   Home Health:No Equipment/Devices:None  Discharge Condition:Stable CODE STATUS:Full Diet recommendation: Heart Healthy   Brief/Interim Summary: 48 y.o. 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  In the ED was found to be septic with pneumonia and an acute PE.    Discharge Diagnoses:  Principal Problem:   Severe sepsis without septic shock (HCC) Active Problems:   Community acquired pneumonia   Lobar pneumonia, unspecified organism   Acute pancreatitis   Acute pulmonary embolism without acute cor pulmonale (HCC)  Sepsis due to multifocal pneumonia with new parapneumonic effusion: Start empirically on antibiotics. Infectious disease was consulted and thoracocentesis was performed on 01/15/2024. She has 17,000 white blood cells predominantly neutrophils. Cultures unfortunately were not sent. ID recommended to continue antibiotics for 1 week postdischarge she was discharged on Doxy and Augmentin  as an outpatient.  New bilateral pulmonary embolism/history of PE: Likely due to noncompliance with her Xarelto . 2D echo showed an EF of 60% no regional wall motion abnormality. Hemoglobin remained stable lower extremity with negative Doppler was negative for DVT. She was given a new prescription for  Xarelto .  Diabetes mellitus type 2 uncontrolled with hyperglycemia/DKA: She was treated conservatively with IV fluids and IV insulin . Her blood glucose remained controlled. Her A1c was 10.0. She has required minimal insulin . She will go home on metformin 1000 mg twice a day. Follow-up with PCP and titrate oral hypoglycemic agents as needed. She required minimal insulin  less than 20 units including both long-acting and short acting in house in a 24-hour period. I question if she has been compliant with her metformin at home.  Acute idiopathic pancreatitis without infection or necrosis: It is to note that Abilify on rare cases can cause pancreatitis this was held she will not resume as an outpatient. She was treated conservatively with IV fluids pain medication nothing by mouth her abdominal pain improved she was able to tolerate her diet she will need to follow-up with GI as an outpatient.  Thrombocytosis: Likely reactive.  Diarrhea: Likely due to antibiotics now resolved.  Acute kidney injury: Likely prerenal resolved with IV fluids.  Hypokalemia/hypomagnesemia: They were repleted orally now improved.  Niemann-Pick disease type II: Chief manage at [facility] with  Xenopozyme infusion every month and steroids.  Systemic mastocytosis: She is currently on immunotherapy at [facility] she will follow-up with them as an outpatient.  Chronic HFpEF: Noted to remain euvolemic.  Essential hypertension: No change made to her medication.  History of cerebral aneurysm: Noted.  Morbid obesity: She has been counseled.     Discharge Instructions  Discharge Instructions     Increase activity slowly   Complete by: As directed       Allergies as of 01/18/2024       Reactions   Ceftin [cefuroxime Axetil] Shortness Of Breath, Swelling   Tolerates ceftriaxone , penicillins and cephalexin     Cinobac [cinoxacin] Shortness Of Breath, Swelling   Has received Levaquin  in inpt &  outpt  setting   Fire Ant Anaphylaxis   Sulfa Antibiotics Other (See Comments)   Unknown         Medication List     STOP taking these medications    Abilify 5 MG tablet Generic drug: ARIPiprazole   amLODipine 5 MG tablet Commonly known as: NORVASC   desipramine  10 MG tablet Commonly known as: NOPRAMIN   dicyclomine  10 MG capsule Commonly known as: Bentyl    Heparin  Na (Pork) Lock Flsh PF 100 UNIT/ML Soln   levofloxacin  750 MG tablet Commonly known as: LEVAQUIN    lisinopril  2.5 MG tablet Commonly known as: ZESTRIL    meloxicam 7.5 MG tablet Commonly known as: MOBIC   nitrofurantoin (macrocrystal-monohydrate) 100 MG capsule Commonly known as: MACROBID   ondansetron  8 MG tablet Commonly known as: ZOFRAN    pramipexole 0.5 MG tablet Commonly known as: MIRAPEX       TAKE these medications    acetaminophen  325 MG tablet Commonly known as: TYLENOL  Take 325 mg by mouth every 6 (six) hours as needed for mild pain (pain score 1-3) or moderate pain (pain score 4-6).   albuterol  108 (90 Base) MCG/ACT inhaler Commonly known as: VENTOLIN  HFA INHALE 1 PUFF EVERY 4 HOURS as NEEDED; Duration: 30 days   amoxicillin -clavulanate 875-125 MG tablet Commonly known as: AUGMENTIN  Take 1 tablet by mouth every 12 (twelve) hours for 7 days.   ascorbic acid  500 MG tablet Commonly known as: VITAMIN C  Take 500 mg by mouth daily after breakfast.   atorvastatin  40 MG tablet Commonly known as: LIPITOR Take 40 mg by mouth at bedtime.   azelastine 0.1 % nasal spray Commonly known as: ASTELIN INSTILL ONE SPRAY INTO BOTH NOSTRILS TWICE DAILY AS DIRECTED   calcium -vitamin D  500-200 MG-UNIT tablet Commonly known as: OSCAL WITH D Take 1 tablet by mouth 2 (two) times daily.   citalopram  40 MG tablet Commonly known as: CELEXA  Take 40 mg by mouth daily after breakfast.   clonazePAM  0.5 MG tablet Commonly known as: KLONOPIN  Take 0.5 mg by mouth 3 (three) times daily as needed for  anxiety.   Cranberry 500 MG Caps Take 500 mg by mouth at bedtime.   cyanocobalamin  1000 MCG tablet Commonly known as: VITAMIN B12 Take 1,000 mcg by mouth daily after breakfast.   doxycycline  100 MG tablet Commonly known as: VIBRA -TABS Take 1 tablet (100 mg total) by mouth every 12 (twelve) hours for 7 days.   DULoxetine  60 MG capsule Commonly known as: CYMBALTA  Take 120 mg by mouth daily.   EpiPen  2-Pak 0.3 mg/0.3 mL Soaj injection Generic drug: EPINEPHrine  Inject 0.3 mg into the muscle daily as needed (allergic reaction).   estradiol 0.1 MG/GM vaginal cream Commonly known as: ESTRACE Place 1 Applicatorful vaginally 2 (two) times a week.   ferrous sulfate  325 (65 FE) MG tablet Take 325 mg by mouth daily with breakfast.   fluconazole 200 MG tablet Commonly known as: DIFLUCAN Take 200 mg by mouth daily.   fluticasone 50 MCG/ACT nasal spray Commonly known as: FLONASE Place 2 sprays into the nose daily as needed for allergies.   folic acid  1 MG tablet Commonly known as: FOLVITE  Take 1 mg by mouth daily after breakfast.   gabapentin  800 MG tablet Commonly known as: NEURONTIN  Take 800 mg by mouth 3 (three) times daily.   Geri-Dryl 25 MG tablet Generic drug: diphenhydrAMINE  Take 25 mg by mouth as directed.   glucagon Emergency 1 MG Solr Inject as directed at bedtime as needed  Hydroxychloroquine  Sulfate 100 MG Tabs Take 300 mg by mouth daily.   hydrOXYzine 25 MG tablet Commonly known as: ATARAX Take 25 mg by mouth every 8 (eight) hours as needed for anxiety.   lidocaine -prilocaine cream Commonly known as: EMLA Apply 1 Application topically as needed [facility] access).   loratadine  10 MG tablet Commonly known as: CLARITIN  Take 10 mg by mouth daily.   meclizine 25 MG tablet Commonly known as: ANTIVERT Take 25-50 mg by mouth as needed for Dizziness   medroxyPROGESTERone Acetate 150 MG/ML Susy Inject 150 mg as directed every 3 (three) months.   metFORMIN  1000 MG tablet Commonly known as: GLUCOPHAGE Take 1,000 mg by mouth 2 (two) times daily.   methylPREDNISolone 8 MG tablet Commonly known as: MEDROL Take 80 mg by mouth as directed. On days she has infusions   metoprolol  tartrate 50 MG tablet Commonly known as: LOPRESSOR  Take 1 tablet (50 mg total) by mouth 2 (two) times daily. What changed:  medication strength how much to take   Multi-Vitamin tablet Take 2 tablets by mouth daily.   naloxone 4 MG/0.1ML Liqd nasal spray kit Commonly known as: NARCAN INSTILL ONE SPRAY INTO NOSTRIL AT SIGNS OF OVERDOSE   Octagam 30 GM/300ML Soln Generic drug: Immune Globulin (Human) INFUSE 30g INTRAVENOUSLY EVERY 4 WEEKS   omeprazole 20 MG capsule Commonly known as: PRILOSEC Take 20 mg by mouth at bedtime.   ondansetron  4 MG disintegrating tablet Commonly known as: ZOFRAN -ODT Take 1 tablet (4 mg total) by mouth every 8 (eight) hours as needed for nausea or vomiting. What changed: You were already taking a medication with the same name, and this prescription was added. Make sure you understand how and when to take each.   ondansetron  8 MG disintegrating tablet Commonly known as: ZOFRAN -ODT Take 1 tablet (8 mg total) by mouth every 8 (eight) hours as needed for nausea or vomiting. What changed: Another medication with the same name was added. Make sure you understand how and when to take each.   Oxycodone  HCl 10 MG Tabs Take 10 mg by mouth 4 (four) times daily as needed (Pain).   Ozempic (2 MG/DOSE) 8 MG/3ML Sopn Generic drug: Semaglutide (2 MG/DOSE) INJECT 2MG  UNDER THE SKIN ONCE WEEKLY Subcutaneous weekly; Duration: 30 days   pantoprazole  40 MG tablet Commonly known as: PROTONIX  Take 1 tablet (40 mg total) by mouth 2 (two) times daily.   Singulair  10 MG tablet Generic drug: montelukast  Take 10 mg by mouth at bedtime.   tiZANidine  4 MG tablet Commonly known as: ZANAFLEX  Take 4 mg by mouth every 6 (six) hours as needed for muscle  spasms.   Xarelto  10 MG Tabs tablet Generic drug: rivaroxaban  Take 1 tablet (10 mg total) by mouth daily.   Xenpozyme 4 MG Solr Generic drug: Olipudase Alfa-rpcp Inject into the vein.   Xenpozyme 20 MG Solr Generic drug: Olipudase Alfa-rpcp as directed Intravenous        Allergies[1]  Consultations: Pulmonary and critical care Infectious disease   Procedures/Studies: DG Chest Port 1 View Result Date: 01/17/2024 CLINICAL DATA:  Pleural effusion EXAM: PORTABLE CHEST 1 VIEW COMPARISON:  Two days ago FINDINGS: Stable cardiomediastinal silhouette. Right internal jugular Port-A-Cath is unchanged. Increased left basilar opacity is noted concerning for atelectasis or infiltrate with pleural effusion. Bony thorax is unremarkable. IMPRESSION: Increased left basilar opacity is noted concerning for atelectasis or infiltrate with associated pleural effusion. Electronically Signed   By: Lynwood Landy Raddle M.D.   On: 01/17/2024 10:42  VAS US  LOWER EXTREMITY VENOUS (DVT) Result Date: 01/17/2024  Lower Venous DVT Study Patient Name:  Megan Weber  Date of Exam:   01/15/2024 Medical Rec #: [id number]        Accession #:    [id number] Date of Birth: 1975/04/03        Patient Gender: F Patient Age:   53 years Exam Location:  [facility] Procedure:      VAS US  LOWER EXTREMITY VENOUS (DVT) Referring Phys: ERLE CLUNES ORTIZ --------------------------------------------------------------------------------  Indications: Pulmonary embolism.  Risk Factors: Hx of PE/DVT. Anticoagulation: Xarelto  - non compliant. Limitations: Poor ultrasound/tissue interface. Comparison Study: Previous LLEV on 07/25/2021 & RLEV on 05/31/2021 were both                   negative for DVT Performing Technologist: Ezzie Potters RVT, RDMS  Examination Guidelines: A complete evaluation includes B-mode imaging, spectral Doppler, color Doppler, and power Doppler as needed of all accessible portions of each vessel. Bilateral testing is  considered an integral part of a complete examination. Limited examinations for reoccurring indications may be performed as noted. The reflux portion of the exam is performed with the patient in reverse Trendelenburg.  +---------+---------------+---------+-----------+----------+--------------+ RIGHT    CompressibilityPhasicitySpontaneityPropertiesThrombus Aging +---------+---------------+---------+-----------+----------+--------------+ CFV      Full           Yes      Yes                                 +---------+---------------+---------+-----------+----------+--------------+ SFJ      Full                                                        +---------+---------------+---------+-----------+----------+--------------+ FV Prox  Full           Yes      Yes                                 +---------+---------------+---------+-----------+----------+--------------+ FV Mid   Full           Yes      Yes                                 +---------+---------------+---------+-----------+----------+--------------+ FV DistalFull           Yes      Yes                                 +---------+---------------+---------+-----------+----------+--------------+ PFV      Full                                                        +---------+---------------+---------+-----------+----------+--------------+ POP      Full           Yes      Yes                                 +---------+---------------+---------+-----------+----------+--------------+  PTV      Full                                                        +---------+---------------+---------+-----------+----------+--------------+ PERO     Full                                                        +---------+---------------+---------+-----------+----------+--------------+   +---------+---------------+---------+-----------+----------+--------------+ LEFT      CompressibilityPhasicitySpontaneityPropertiesThrombus Aging +---------+---------------+---------+-----------+----------+--------------+ CFV      Full           Yes      Yes                                 +---------+---------------+---------+-----------+----------+--------------+ SFJ      Full                                                        +---------+---------------+---------+-----------+----------+--------------+ FV Prox  Full           Yes      Yes                                 +---------+---------------+---------+-----------+----------+--------------+ FV Mid   Full           Yes      Yes                                 +---------+---------------+---------+-----------+----------+--------------+ FV DistalFull           Yes      Yes                                 +---------+---------------+---------+-----------+----------+--------------+ PFV      Full                                                        +---------+---------------+---------+-----------+----------+--------------+ POP      Full           Yes      Yes                                 +---------+---------------+---------+-----------+----------+--------------+ PTV      Full                                                        +---------+---------------+---------+-----------+----------+--------------+  PERO     Full                                                        +---------+---------------+---------+-----------+----------+--------------+     Summary: BILATERAL: - No evidence of deep vein thrombosis seen in the lower extremities, bilaterally. -No evidence of popliteal cyst, bilaterally.   *See table(s) above for measurements and observations. Electronically signed by Fonda Rim on 01/17/2024 at 7:53:09 AM.    Final    DG Chest Port 1 View Result Date: 01/15/2024 EXAM: 1 VIEW(S) XRAY OF THE CHEST 01/15/2024 04:26:00 PM COMPARISON: Comparison with 07/31/2023.  CLINICAL HISTORY: [id number] S/P thoracentesis [id number] S/P thoracentesis FINDINGS: LINES, TUBES AND DEVICES: There is a power port type central venous catheter with the tip over the low SVC region. LUNGS AND PLEURA: Shallow inspiration. Small left pleural effusion with basilar atelectasis or infiltration. No pneumothorax. The right lung is clear. HEART AND MEDIASTINUM: Heart size and pulmonary vascularity are normal. BONES AND SOFT TISSUES: Surgical clips in the left upper quadrant. No acute osseous abnormality. IMPRESSION: 1. No pneumothorax. 2. Small left pleural effusion with basilar atelectasis or infiltration. Electronically signed by: Elsie Gravely MD 01/15/2024 04:36 PM EST RP Workstation: [id number]   IR THORACENTESIS ASP PLEURAL SPACE W/IMG GUIDE Result Date: 01/15/2024 INDICATION: Patient with history of asthma, Pickwick disease, DVT on Xarelto , systemic mastocytosis, pneumonia, recent pulmonary emboli, left pleural effusion; request received for diagnostic and therapeutic left thoracentesis. EXAM: ULTRASOUND GUIDED DIAGNOSTIC AND THERAPEUTIC LEFT THORACENTESIS MEDICATIONS: 8 mL 1% lidocaine  with epinephrine  to skin/subcutaneous tissue COMPLICATIONS: None immediate. PROCEDURE: An ultrasound guided thoracentesis was thoroughly discussed with the patient and questions answered. The benefits, risks, alternatives and complications were also discussed. The patient understands and wishes to proceed with the procedure. Written consent was obtained. Ultrasound was performed to localize and mark an adequate pocket of fluid in the left chest. The area was then prepped and draped in the normal sterile fashion. 1% Lidocaine  was used for local anesthesia. Under ultrasound guidance a 6 Fr Safe-T-Centesis catheter was introduced. Thoracentesis was performed. The catheter was removed and a dressing applied. FINDINGS: A total of approximately 360 cc of turbid, light yellow fluid was removed. Samples were sent to the  laboratory as requested by the clinical team. IMPRESSION: Successful ultrasound guided diagnostic and therapeutic left thoracentesis yielding 360 cc of pleural fluid. Performed by: Franky Rakers, PA-C Electronically Signed   By: Ester Sides M.D.   On: 01/15/2024 16:15   CT ABDOMEN PELVIS WO CONTRAST Result Date: 01/15/2024 EXAM: CT ABDOMEN AND PELVIS WITHOUT CONTRAST 01/15/2024 08:41:00 AM TECHNIQUE: CT of the abdomen and pelvis was performed without the administration of intravenous contrast. Multiplanar reformatted images are provided for review. Automated exposure control, iterative reconstruction, and/or weight-based adjustment of the mA/kV was utilized to reduce the radiation dose to as low as reasonably achievable. COMPARISON: None available. CLINICAL HISTORY: Abdominal pain, acute, nonlocalized. FINDINGS: LOWER CHEST: Large layering left pleural effusion with passive atelectasis of the left lower lobe. Mild scarring in the medial right lung base. LIVER: Low attenuation in the liver consistent with hepatic steatosis. GALLBLADDER AND BILE DUCTS: Sludge within the gallbladder. No gallbladder inflammation. No ductal dilatation. SPLEEN: Post splenectomy. PANCREAS: No acute abnormality. ADRENAL GLANDS: No acute abnormality. KIDNEYS, URETERS AND BLADDER: No stones in the kidneys or ureters.  No hydronephrosis. No perinephric or periureteral stranding. Urinary bladder is unremarkable. GI AND BOWEL: Stomach demonstrates no acute abnormality. Normal appendix. No bowel inflammation or obstruction. PERITONEUM AND RETROPERITONEUM: No ascites. No free air. VASCULATURE: Aorta is normal in caliber. Arteries are normal. LYMPH NODES: No lymphadenopathy. REPRODUCTIVE ORGANS: Uterus is normal. BONES AND SOFT TISSUES: No acute osseous abnormality. No focal soft tissue abnormality. IMPRESSION: 1. No acute findings in the abdomen and pelvis. 2. Hepatic steatosis. 3. Large left pleural effusion with passive atelectasis of the  left lower lobe. Electronically signed by: Norleen Boxer MD 01/15/2024 10:45 AM EST RP Workstation: [id number]   ECHOCARDIOGRAM COMPLETE Result Date: 01/13/2024    ECHOCARDIOGRAM REPORT   Patient Name:   Megan Weber Date of Exam: 01/13/2024 Medical Rec #:  [id number]       Height:       61.0 in Accession #:    [id number]      Weight:       166.0 lb Date of Birth:  May 26, 1975       BSA:          1.745 m Patient Age:    48 years        BP:           166/99 mmHg Patient Gender: F               HR:           92 bpm. Exam Location:  Inpatient Procedure: 2D Echo, Cardiac Doppler and Color Doppler (Both Spectral and Color            Flow Doppler were utilized during procedure). Indications:    Pulmonary Embolus  History:        Patient has no prior history of Echocardiogram examinations.                 Risk Factors:Hypertension and Diabetes.  Sonographer:    Philomena Daring Referring Phys: [id number] PAULA SOUTHERLY IMPRESSIONS  1. Left ventricular ejection fraction, by estimation, is 60 to 65%. The left ventricle has normal function. The left ventricle has no regional wall motion abnormalities. There is mild left ventricular hypertrophy. Left ventricular diastolic parameters are consistent with Grade I diastolic dysfunction (impaired relaxation).  2. Right ventricular systolic function is low normal. The right ventricular size is normal. Tricuspid regurgitation signal is inadequate for assessing PA pressure.  3. The mitral valve is grossly normal. No evidence of mitral valve regurgitation.  4. The aortic valve is tricuspid. Aortic valve regurgitation is not visualized. No aortic stenosis is present.  5. The inferior vena cava is normal in size with <50% respiratory variability, suggesting right atrial pressure of 8 mmHg. Comparison(s): No prior Echocardiogram. No right heart strain. FINDINGS  Left Ventricle: Left ventricular ejection fraction, by estimation, is 60 to 65%. The left ventricle has normal function. The left  ventricle has no regional wall motion abnormalities. The left ventricular internal cavity size was normal in size. There is  mild left ventricular hypertrophy. Left ventricular diastolic parameters are consistent with Grade I diastolic dysfunction (impaired relaxation). Indeterminate filling pressures. Right Ventricle: The right ventricular size is normal. No increase in right ventricular wall thickness. Right ventricular systolic function is low normal. Tricuspid regurgitation signal is inadequate for assessing PA pressure. Left [facility]: Left atrial size was normal in size. Right [facility]: Right atrial size was normal in size. Pericardium: There is no evidence of pericardial effusion. Mitral Valve: The mitral valve is grossly normal. No evidence of  mitral valve regurgitation. Tricuspid Valve: The tricuspid valve is normal in structure. Tricuspid valve regurgitation is not demonstrated. Aortic Valve: The aortic valve is tricuspid. Aortic valve regurgitation is not visualized. No aortic stenosis is present. Pulmonic Valve: The pulmonic valve was normal in structure. Pulmonic valve regurgitation is not visualized. Aorta: The aortic root and ascending aorta are structurally normal, with no evidence of dilitation. Venous: The inferior vena cava is normal in size with less than 50% respiratory variability, suggesting right atrial pressure of 8 mmHg. IAS/Shunts: No atrial level shunt detected by color flow Doppler.  LEFT VENTRICLE PLAX 2D LVIDd:         3.30 cm   Diastology LVIDs:         2.40 cm   LV e' medial:    8.38 cm/s LV PW:         1.10 cm   LV E/e' medial:  10.1 LV IVS:        1.20 cm   LV e' lateral:   9.79 cm/s LVOT diam:     2.10 cm   LV E/e' lateral: 8.7 LV SV:         48 LV SV Index:   28 LVOT Area:     3.46 cm LV IVRT:       58 msec  RIGHT VENTRICLE             IVC RV Basal diam:  2.60 cm     IVC diam: 1.50 cm RV Mid diam:    2.30 cm RV S prime:     10.10 cm/s TAPSE (M-mode): 1.8 cm LEFT ATRIUM              Index        RIGHT ATRIUM           Index LA diam:        2.30 cm 1.32 cm/m   RA Area:     12.50 cm LA Vol (A2C):   31.4 ml 17.99 ml/m  RA Volume:   26.00 ml  14.90 ml/m LA Vol (A4C):   29.5 ml 16.91 ml/m LA Biplane Vol: 30.9 ml 17.71 ml/m  AORTIC VALVE LVOT Vmax:   100.00 cm/s LVOT Vmean:  67.300 cm/s LVOT VTI:    0.140 m  AORTA Ao Root diam: 2.90 cm Ao Asc diam:  2.70 cm MITRAL VALVE MV Area (PHT): 7.44 cm     SHUNTS MV Decel Time: 102 msec     Systemic VTI:  0.14 m MV E velocity: 84.80 cm/s   Systemic Diam: 2.10 cm MV A velocity: 100.00 cm/s MV E/A ratio:  0.85 Vinie Maxcy MD Electronically signed by Vinie Maxcy MD Signature Date/Time: 01/13/2024/3:55:04 PM    Final    US  Abdomen Complete Result Date: 01/13/2024 CLINICAL DATA:  Abdominal pain.  Pancreatitis.  Prior splenectomy. EXAM: ABDOMEN ULTRASOUND COMPLETE COMPARISON:  CT on 01/11/2024 FINDINGS: Gallbladder: No gallstones or wall thickening visualized. No sonographic Murphy sign noted by sonographer. Common bile duct: Diameter: 3 mm, within normal limits. Liver: Diffusely increased parenchymal echogenicity, consistent with steatosis. No focal liver lesion identified. Portal vein is patent on color Doppler imaging with normal direction of blood flow towards the liver. IVC: No abnormality visualized. Pancreas: Not well visualized due to overlying bowel gas. Spleen: Surgically absent. Right Kidney: Length: 10.0 cm. Echogenicity within normal limits. No mass or hydronephrosis visualized. Left Kidney: Length: 10.6 cm. Echogenicity within normal limits. No mass or hydronephrosis visualized. Abdominal aorta: No aneurysm visualized.  Other findings: None. IMPRESSION: No evidence of cholelithiasis or biliary ductal dilatation. Diffuse hepatic steatosis. Electronically Signed   By: Norleen DELENA Kil M.D.   On: 01/13/2024 10:24   CT CHEST ABDOMEN PELVIS W CONTRAST Result Date: 01/11/2024 EXAM: CT CHEST, ABDOMEN AND PELVIS WITH CONTRAST 01/11/2024 12:23:00  PM TECHNIQUE: CT of the chest, abdomen and pelvis was performed with the administration of intravenous contrast. Multiplanar reformatted images are provided for review. Automated exposure control, iterative reconstruction, and/or weight based adjustment of the mA/kV was utilized to reduce the radiation dose to as low as reasonably achievable. COMPARISON: CTA chest 03/15/2022, CT abdomen and pelvis 01/30/2017. CLINICAL HISTORY: 48 year old female, sepsis. FINDINGS: CHEST: MEDIASTINUM AND LYMPH NODES: Low density bilateral pulmonary emboli are present in the lobar branches, at the left main pulmonary artery bifurcation. No saddle embolus. Abundant bilateral lower lobes branch involvement. Heart and pericardium are unremarkable. The central airways are clear. No mediastinal, hilar or axillary lymphadenopathy. Right chest port a cath is new, no adverse features. No pericardial effusion. Negative thoracic aorta. LUNGS AND PLEURA: Multifocal peribronchial ground glass opacity, including in the posterior right upper lobe and streaky more confluent bilateral middle lobe and left lower lobe opacity which is not significantly enhancing. This area does not appear severely affected by pulmonary emboli. No pleural effusion. No large pulmonary infarct. No thoracic lymphadenopathy. Major airways are patent. ABDOMEN AND PELVIS: LIVER: Evidence of chronic hepatic steatosis. GALLBLADDER AND BILE DUCTS: Gallbladder is unremarkable. No biliary ductal dilatation. SPLEEN: Chronic splenectomy. PANCREAS: Chronic splenectomy with mild architectural distortion at the tail of the pancreas. However, peripancreatic inflammatory stranding on coronal image 51 is new compared to 2019 , and also was not apparent on the CTA Chest last year. Pancreatic enhancement is maintained. No ductal dilatation. ADRENAL GLANDS: No acute abnormality. KIDNEYS, URETERS AND BLADDER: No stones in the kidneys or ureters. No hydronephrosis. No perinephric or periureteral  stranding. Urinary bladder is unremarkable. GI AND BOWEL: Fluid throughout nondilated large and small bowel loops including fluid in the colon present to the rectum. Normal retrocecal appendix containing gas. No discrete bowel wall thickening, although generalized mucosal hyperenhancement is possible. Nondilated stomach and duodenum. REPRODUCTIVE ORGANS: No acute abnormality. PERITONEUM AND RETROPERITONEUM: No ascites. No free air. Subtle but asymmetric low density within the left common femoral vein. VASCULATURE: Aorta is normal in caliber. Abdominal aortic calcified atherosclerosis. Major arterial structures and portal venous system appear to be patent. ABDOMINAL AND PELVIS LYMPH NODES: No lymphadenopathy. BONES AND SOFT TISSUES: Chronic disc degeneration at the lumbosacral junction with vacuum disc. No acute osseous abnormality. No focal soft tissue abnormality. IMPRESSION: 1. Acute bilateral pulmonary emboli including some lobar artery involvement (RV/LV ratio 0.71, no obvious heart strain). Possible left common femoral vein deep venous thrombosis; Ultrasound would be necessary to confirm. 2. Superimposed multifocal peribronchial and streaky lung opacity, more consistent with bronchopneumonia/infection than pulmonary infarction. No pleural effusion. 3. Evidence of Acute Pancreatitis limited to the pancreatic tail. 4. Fluid and possible mucosal hyperenhancement throughout nondilated small and large bowel loops, suggesting acute enteritis / diarrhea. Normal appendix. 5. Chronically absent spleen. Chronic hepatic steatosis, calcified aortic atherosclerosis. 6. Salient findings discussed discussed by telephone with Dr. Yolande in the ED at 1244 hours. Electronically signed by: Helayne Hurst MD 01/11/2024 12:47 PM EST RP Workstation: [id number]   (Echo, Carotid, EGD, Colonoscopy, ERCP)    Subjective: No complaints feels good this morning.  Discharge Exam: Vitals:   01/17/24 2329 01/18/24 0508  BP: 105/68  115/62  Pulse: 77 85  Resp: 20 20  Temp: 97.7 F (36.5 C) 97.9 F (36.6 C)  SpO2: 97% 98%   Vitals:   01/17/24 0616 01/17/24 1119 01/17/24 2329 01/18/24 0508  BP: 133/79 115/71 105/68 115/62  Pulse: (!) 119 79 77 85  Resp: 17 19 20 20   Temp: 98.9 F (37.2 C) 98.8 F (37.1 C) 97.7 F (36.5 C) 97.9 F (36.6 C)  TempSrc: Oral Oral    SpO2: 99%  97% 98%  Weight:      Height:        General: Pt is alert, awake, not in acute distress Cardiovascular: RRR, S1/S2 +, no rubs, no gallops Respiratory: CTA bilaterally, no wheezing, no rhonchi Abdominal: Soft, NT, ND, bowel sounds + Extremities: no edema, no cyanosis    The results of significant diagnostics from this hospitalization (including imaging, microbiology, ancillary and laboratory) are listed below for reference.     Microbiology: Recent Results (from the past 240 hours)  Resp panel by RT-PCR (RSV, Flu A&B, Covid) Anterior Nasal Swab     Status: None   Collection Time: 01/11/24 10:14 AM   Specimen: Anterior Nasal Swab  Result Value Ref Range Status   SARS Coronavirus 2 by RT PCR NEGATIVE NEGATIVE Final    Comment: (NOTE) SARS-CoV-2 target nucleic acids are NOT DETECTED.  The SARS-CoV-2 RNA is generally detectable in upper respiratory specimens during the acute phase of infection. The lowest concentration of SARS-CoV-2 viral copies this assay can detect is 138 copies/mL. A negative result does not preclude SARS-Cov-2 infection and should not be used as the sole basis for treatment or other patient management decisions. A negative result may occur with  improper specimen collection/handling, submission of specimen other than nasopharyngeal swab, presence of viral mutation(s) within the areas targeted by this assay, and inadequate number of viral copies(<138 copies/mL). A negative result must be combined with clinical observations, patient history, and epidemiological information. The expected result is  Negative.  Fact Sheet for Patients:  bloggercourse.com  Fact Sheet for Healthcare Providers:  seriousbroker.it  This test is no t yet approved or cleared by the [place]  FDA and  has been authorized for detection and/or diagnosis of SARS-CoV-2 by FDA under an Emergency Use Authorization (EUA). This EUA will remain  in effect (meaning this test can be used) for the duration of the COVID-19 declaration under Section 564(b)(1) of the Act, 21 U.S.C.section 360bbb-3(b)(1), unless the authorization is terminated  or revoked sooner.       Influenza A by PCR NEGATIVE NEGATIVE Final   Influenza B by PCR NEGATIVE NEGATIVE Final    Comment: (NOTE) The Xpert Xpress SARS-CoV-2/FLU/RSV plus assay is intended as an aid in the diagnosis of influenza from Nasopharyngeal swab specimens and should not be used as a sole basis for treatment. Nasal washings and aspirates are unacceptable for Xpert Xpress SARS-CoV-2/FLU/RSV testing.  Fact Sheet for Patients: bloggercourse.com  Fact Sheet for Healthcare Providers: seriousbroker.it  This test is not yet approved or cleared by the [place]  FDA and has been authorized for detection and/or diagnosis of SARS-CoV-2 by FDA under an Emergency Use Authorization (EUA). This EUA will remain in effect (meaning this test can be used) for the duration of the COVID-19 declaration under Section 564(b)(1) of the Act, 21 U.S.C. section 360bbb-3(b)(1), unless the authorization is terminated or revoked.     Resp Syncytial Virus by PCR NEGATIVE NEGATIVE Final    Comment: (NOTE) Fact Sheet for Patients: bloggercourse.com  Fact Sheet  for Healthcare Providers: seriousbroker.it  This test is not yet approved or cleared by the [place]  FDA and has been authorized for detection and/or diagnosis of  SARS-CoV-2 by FDA under an Emergency Use Authorization (EUA). This EUA will remain in effect (meaning this test can be used) for the duration of the COVID-19 declaration under Section 564(b)(1) of the Act, 21 U.S.C. section 360bbb-3(b)(1), unless the authorization is terminated or revoked.  Performed at [facility], [address] [place], [place] [address]   Culture, blood (routine x 2)     Status: None   Collection Time: 01/11/24 10:32 AM   Specimen: BLOOD  Result Value Ref Range Status   Specimen Description   Final    BLOOD LEFT ANTECUBITAL Performed at [facility], [address] [place], [place] [address]    Special Requests   Final    BOTTLES DRAWN AEROBIC AND ANAEROBIC Blood Culture adequate volume Performed at [facility], [address] [place], [place] [address]    Culture   Final    NO GROWTH 5 DAYS Performed at [facility], [address], [place], [place] [address]    Report Status 01/16/2024 FINAL  Final  MRSA Next Gen by PCR, Nasal     Status: None   Collection Time: 01/11/24  7:01 PM   Specimen: Nasal Mucosa; Nasal Swab  Result Value Ref Range Status   MRSA by PCR Next Gen NOT DETECTED NOT DETECTED Final    Comment: (NOTE) The GeneXpert MRSA Assay (FDA approved for NASAL specimens only), is one component of a comprehensive MRSA colonization surveillance program. It is not intended to diagnose MRSA infection nor to guide or monitor treatment for MRSA infections. Test performance is not FDA approved in patients less than 42 years old. Performed at [facility], [address] [place], [place] [address]   Culture, blood (routine x 2) Call MD if unable to obtain prior to antibiotics being given     Status: None   Collection Time: 01/11/24  9:09 PM   Specimen: BLOOD LEFT HAND  Result Value Ref Range Status   Specimen Description   Final    BLOOD LEFT HAND Performed at [facility],  [address], [place], [place] [address]    Special Requests   Final    BOTTLES DRAWN AEROBIC ONLY Blood Culture results may not be optimal due to an inadequate volume of blood received in culture bottles Performed at [facility], [address], [place], [place] [address]    Culture   Final    NO GROWTH 5 DAYS Performed at [facility], [address], [place], [place] [address]    Report Status 01/16/2024 FINAL  Final  Culture, blood (routine x 2) Call MD if unable to obtain prior to antibiotics being given     Status: None   Collection Time: 01/11/24  9:09 PM   Specimen: BLOOD LEFT HAND  Result Value Ref Range Status   Specimen Description   Final    BLOOD LEFT HAND Performed at [facility], [address], [place], [place] [address]    Special Requests   Final    BOTTLES DRAWN AEROBIC ONLY Blood Culture adequate volume Performed at [facility], [address], [place], [place] [address]    Culture   Final    NO GROWTH 5 DAYS Performed at [facility], [address], [place], [place] [address]    Report Status 01/16/2024 FINAL  Final     Labs: BNP (last 3 results) No results for input(s): BNP in the last 8760 hours. Basic Metabolic Panel: Recent Labs  Lab 01/11/24 2109 01/12/24 0554 01/13/24 0618 01/14/24 0509 01/15/24 0743 01/16/24 0435  NA 135 139 138 140 140 139  K 3.4* 3.4* 3.2* 3.9 3.4* 3.8  CL 103 107 103 112* 107 107  CO2 16* 21* 24 21* 22 22  GLUCOSE 292* 203* 252* 103* 124* 107*  BUN 9 5* 7 7 <5* <5*  CREATININE 0.89 0.82 0.67 0.73 0.66 0.61  CALCIUM  8.8* 8.6* 8.5* 7.7* 8.6* 8.6*  MG 1.7  --  1.6* 1.6*  --   --   PHOS 1.9*  --  3.9  --  3.4 3.0   Liver Function Tests: Recent Labs  Lab 01/11/24 2109 01/12/24 0554 01/13/24 0618 01/15/24 0743 01/15/24 1106 01/16/24 0435  AST 34 29 17  --  15  --   ALT 16 13 13   --  10  --   ALKPHOS 155* 140* 147*  --  90  --   BILITOT 0.8 0.7 0.5  --   0.3  --   PROT 6.5 5.7* 5.8*  --  5.2*  --   ALBUMIN  3.3* 2.9* 2.9* 2.9* 2.7* 2.9*   Recent Labs  Lab 01/13/24 0618 01/14/24 0509  LIPASE 191* 132*   No results for input(s): AMMONIA in the last 168 hours. CBC: Recent Labs  Lab 01/14/24 0509 01/15/24 0743 01/16/24 0435 01/17/24 0510 01/18/24 1059  WBC 14.1* 18.6* 16.8* 13.9* 13.7*  HGB 8.9* 10.2* 10.3* 10.1* 10.1*  HCT 27.1* 30.4* 31.3* 30.2* 30.4*  MCV 90.6 89.1 90.7 88.6 88.9  PLT 363 489* 535* 595* 665*   Cardiac Enzymes: No results for input(s): CKTOTAL, CKMB, CKMBINDEX, TROPONINI in the last 168 hours. BNP: Invalid input(s): POCBNP CBG: Recent Labs  Lab 01/17/24 1609 01/17/24 2330 01/18/24 0453 01/18/24 0747 01/18/24 1134  GLUCAP 121* 186* 121* 124* 131*   D-Dimer No results for input(s): DDIMER in the last 72 hours. Hgb A1c No results for input(s): HGBA1C in the last 72 hours. Lipid Profile No results for input(s): CHOL, HDL, LDLCALC, TRIG, CHOLHDL, LDLDIRECT in the last 72 hours. Thyroid function studies No results for input(s): TSH, T4TOTAL, T3FREE, THYROIDAB in the last 72 hours.  Invalid input(s): FREET3 Anemia work up No results for input(s): VITAMINB12, FOLATE, FERRITIN, TIBC, IRON, RETICCTPCT in the last 72 hours. Urinalysis    Component Value Date/Time   COLORURINE YELLOW 01/11/2024 1011   APPEARANCEUR CLEAR 01/11/2024 1011   LABSPEC <=1.005 01/11/2024 1011   PHURINE 5.5 01/11/2024 1011   GLUCOSEU >=500 (A) 01/11/2024 1011   HGBUR TRACE (A) 01/11/2024 1011   BILIRUBINUR NEGATIVE 01/11/2024 1011   KETONESUR NEGATIVE 01/11/2024 1011   PROTEINUR NEGATIVE 01/11/2024 1011   UROBILINOGEN 0.2 06/21/2012 1652   NITRITE NEGATIVE 01/11/2024 1011   LEUKOCYTESUR NEGATIVE 01/11/2024 1011   Sepsis Labs Recent Labs  Lab 01/15/24 0743 01/16/24 0435 01/17/24 0510 01/18/24 1059  WBC 18.6* 16.8* 13.9* 13.7*   Microbiology Recent Results (from the  past 240 hours)  Resp panel by RT-PCR (RSV, Flu A&B, Covid) Anterior Nasal Swab     Status: None   Collection Time: 01/11/24 10:14 AM   Specimen: Anterior Nasal Swab  Result Value Ref Range Status   SARS Coronavirus 2 by RT PCR NEGATIVE NEGATIVE Final  Comment: (NOTE) SARS-CoV-2 target nucleic acids are NOT DETECTED.  The SARS-CoV-2 RNA is generally detectable in upper respiratory specimens during the acute phase of infection. The lowest concentration of SARS-CoV-2 viral copies this assay can detect is 138 copies/mL. A negative result does not preclude SARS-Cov-2 infection and should not be used as the sole basis for treatment or other patient management decisions. A negative result may occur with  improper specimen collection/handling, submission of specimen other than nasopharyngeal swab, presence of viral mutation(s) within the areas targeted by this assay, and inadequate number of viral copies(<138 copies/mL). A negative result must be combined with clinical observations, patient history, and epidemiological information. The expected result is Negative.  Fact Sheet for Patients:  bloggercourse.com  Fact Sheet for Healthcare Providers:  seriousbroker.it  This test is no t yet approved or cleared by the [place]  FDA and  has been authorized for detection and/or diagnosis of SARS-CoV-2 by FDA under an Emergency Use Authorization (EUA). This EUA will remain  in effect (meaning this test can be used) for the duration of the COVID-19 declaration under Section 564(b)(1) of the Act, 21 U.S.C.section 360bbb-3(b)(1), unless the authorization is terminated  or revoked sooner.       Influenza A by PCR NEGATIVE NEGATIVE Final   Influenza B by PCR NEGATIVE NEGATIVE Final    Comment: (NOTE) The Xpert Xpress SARS-CoV-2/FLU/RSV plus assay is intended as an aid in the diagnosis of influenza from Nasopharyngeal swab specimens  and should not be used as a sole basis for treatment. Nasal washings and aspirates are unacceptable for Xpert Xpress SARS-CoV-2/FLU/RSV testing.  Fact Sheet for Patients: bloggercourse.com  Fact Sheet for Healthcare Providers: seriousbroker.it  This test is not yet approved or cleared by the [place]  FDA and has been authorized for detection and/or diagnosis of SARS-CoV-2 by FDA under an Emergency Use Authorization (EUA). This EUA will remain in effect (meaning this test can be used) for the duration of the COVID-19 declaration under Section 564(b)(1) of the Act, 21 U.S.C. section 360bbb-3(b)(1), unless the authorization is terminated or revoked.     Resp Syncytial Virus by PCR NEGATIVE NEGATIVE Final    Comment: (NOTE) Fact Sheet for Patients: bloggercourse.com  Fact Sheet for Healthcare Providers: seriousbroker.it  This test is not yet approved or cleared by the [place]  FDA and has been authorized for detection and/or diagnosis of SARS-CoV-2 by FDA under an Emergency Use Authorization (EUA). This EUA will remain in effect (meaning this test can be used) for the duration of the COVID-19 declaration under Section 564(b)(1) of the Act, 21 U.S.C. section 360bbb-3(b)(1), unless the authorization is terminated or revoked.  Performed at [facility], [address] [place], [place] [address]   Culture, blood (routine x 2)     Status: None   Collection Time: 01/11/24 10:32 AM   Specimen: BLOOD  Result Value Ref Range Status   Specimen Description   Final    BLOOD LEFT ANTECUBITAL Performed at [facility], [address] [place], [place] [address]    Special Requests   Final    BOTTLES DRAWN AEROBIC AND ANAEROBIC Blood Culture adequate volume Performed at [facility], [address] [place], [place] [address]    Culture   Final     NO GROWTH 5 DAYS Performed at [facility], [address], [place], [place] [address]    Report Status 01/16/2024 FINAL  Final  MRSA Next Gen  by PCR, Nasal     Status: None   Collection Time: 01/11/24  7:01 PM   Specimen: Nasal Mucosa; Nasal Swab  Result Value Ref Range Status   MRSA by PCR Next Gen NOT DETECTED NOT DETECTED Final    Comment: (NOTE) The GeneXpert MRSA Assay (FDA approved for NASAL specimens only), is one component of a comprehensive MRSA colonization surveillance program. It is not intended to diagnose MRSA infection nor to guide or monitor treatment for MRSA infections. Test performance is not FDA approved in patients less than 55 years old. Performed at [facility], [address], [place], [place] [address]   Culture, blood (routine x 2) Call MD if unable to obtain prior to antibiotics being given     Status: None   Collection Time: 01/11/24  9:09 PM   Specimen: BLOOD LEFT HAND  Result Value Ref Range Status   Specimen Description   Final    BLOOD LEFT HAND Performed at [facility], [address], [place], [place] [address]    Special Requests   Final    BOTTLES DRAWN AEROBIC ONLY Blood Culture results may not be optimal due to an inadequate volume of blood received in culture bottles Performed at [facility], [address], [place], [place] [address]    Culture   Final    NO GROWTH 5 DAYS Performed at [facility], [address], [place], [place] [address]    Report Status 01/16/2024 FINAL  Final  Culture, blood (routine x 2) Call MD if unable to obtain prior to antibiotics being given     Status: None   Collection Time: 01/11/24  9:09 PM   Specimen: BLOOD LEFT HAND  Result Value Ref Range Status   Specimen Description   Final    BLOOD LEFT HAND Performed at [facility], [address] [place], [place] [address]    Special Requests   Final    BOTTLES DRAWN AEROBIC ONLY Blood  Culture adequate volume Performed at [facility], [address], [place], [place] [address]    Culture   Final    NO GROWTH 5 DAYS Performed at [facility], [address] [place], [place] [address]    Report Status 01/16/2024 FINAL  Final     Time coordinating discharge: Over 35 minutes  SIGNED:   Erle Odell Castor, MD  [facility] 01/18/2024, 11:37 AM Pager   If 7PM-7AM, please contact night-coverage [email or web address] Password [email or web address]     [1]  Allergies Allergen Reactions   Ceftin [Cefuroxime Axetil] Shortness Of Breath and Swelling    Tolerates ceftriaxone , penicillins and cephalexin     Cinobac [Cinoxacin] Shortness Of Breath and Swelling    Has received Levaquin  in inpt & outpt setting   Fire Ant Anaphylaxis   Sulfa Antibiotics Other (See Comments)    Unknown

## 2024-01-18 NOTE — Care Management Important Message (Signed)
 Important Message  Patient Details IM Letter given Name: Yarrow Linhart MRN: [id number] Date of Birth: Apr 13, 1975   Important Message Given:  Yes - Medicare IM     Melba Ates 01/18/2024, 11:57 AM

## 2024-01-18 NOTE — Progress Notes (Signed)
Discharge medications delivered to bedside.

## 2024-01-18 NOTE — TOC Transition Note (Signed)
 Transition of Care [facility]) - Discharge Note   Patient Details  Name: Megan Weber MRN: [id number] Date of Birth: 05-13-1975  [facility] CM/SW Contact:  Sonda Manuella Quill, RN Phone Number: 01/18/2024, 12:11 PM   Clinical Narrative:    D/C orders received; no IP CM needs.   Final next level of care: Home/Self Care Barriers to Discharge: No Barriers Identified   Patient Goals and CMS Choice Patient states their goals for this hospitalization and ongoing recovery are:: Return home CMS Medicare.gov Compare Post Acute Care list provided to:: Patient Choice offered to / list presented to : Patient [place] ownership interest in [facility].provided to:: Patient    Discharge Placement                       Discharge Plan and Services Additional resources added to the After Visit Summary for   In-house Referral: NA Discharge Planning Services: CM Consult Post Acute Care Choice: NA          DME Arranged: N/A DME Agency: NA       HH Arranged: NA HH Agency: NA        Social Drivers of Health (SDOH) Interventions SDOH Screenings   Food Insecurity: No Food Insecurity (01/12/2024)  Housing: Low Risk (01/12/2024)  Transportation Needs: No Transportation Needs (01/12/2024)  Utilities: Not At Risk (01/12/2024)  Financial Resource Strain: High Risk (09/13/2021)   Received from [facility]  Stress: Stress Concern Present (09/13/2021)   Received from [facility]  Tobacco Use: Low Risk (01/11/2024)     Readmission Risk Interventions    01/15/2024    2:18 PM 01/14/2024    3:49 PM  Readmission Risk Prevention Plan  Transportation Screening Complete Complete  PCP or Specialist Appt within 3-5 Days Complete Complete  HRI or Home Care Consult Complete Complete  Social Work Consult for Recovery Care Planning/Counseling Complete Complete  Palliative Care Screening Not Applicable Not Applicable  Medication Review Oceanographer)  Complete Complete

## 2024-01-20 LAB — BLOOD GAS, VENOUS
Acid-base deficit: 4.2 mmol/L — ABNORMAL HIGH (ref 0.0–2.0)
Bicarbonate: 20 mmol/L (ref 20.0–28.0)
Drawn by: 64037
O2 Saturation: 79.2 %
Patient temperature: 37
pCO2, Ven: 33 mmHg — ABNORMAL LOW (ref 44–60)
pH, Ven: 7.39 (ref 7.25–7.43)
pO2, Ven: 48 mmHg — ABNORMAL HIGH (ref 32–45)

## 2024-01-30 ENCOUNTER — Ambulatory Visit (INDEPENDENT_AMBULATORY_CARE_PROVIDER_SITE_OTHER)

## 2024-01-30 DIAGNOSIS — T63421D Toxic effect of venom of ants, accidental (unintentional), subsequent encounter: Secondary | ICD-10-CM | POA: Diagnosis not present

## 2024-02-27 ENCOUNTER — Ambulatory Visit

## 2024-02-27 DIAGNOSIS — T63421D Toxic effect of venom of ants, accidental (unintentional), subsequent encounter: Secondary | ICD-10-CM | POA: Diagnosis not present
